# Patient Record
Sex: Male | Born: 1945 | Race: White | Hispanic: No | Marital: Married | Smoking: Former smoker
Health system: Southern US, Community
[De-identification: ages and names within clinical notes are randomized; demographics above are authoritative.]

## PROBLEM LIST (undated history)

## (undated) DIAGNOSIS — I219 Acute myocardial infarction, unspecified: Secondary | ICD-10-CM

## (undated) DIAGNOSIS — I2510 Atherosclerotic heart disease of native coronary artery without angina pectoris: Secondary | ICD-10-CM

## (undated) DIAGNOSIS — I714 Abdominal aortic aneurysm, without rupture, unspecified: Secondary | ICD-10-CM

## (undated) DIAGNOSIS — I10 Essential (primary) hypertension: Secondary | ICD-10-CM

## (undated) DIAGNOSIS — I6529 Occlusion and stenosis of unspecified carotid artery: Secondary | ICD-10-CM

## (undated) DIAGNOSIS — I712 Thoracic aortic aneurysm, without rupture, unspecified: Secondary | ICD-10-CM

## (undated) DIAGNOSIS — M069 Rheumatoid arthritis, unspecified: Secondary | ICD-10-CM

## (undated) HISTORY — PX: FRACTURE SURGERY: SHX138

## (undated) HISTORY — DX: Rheumatoid arthritis, unspecified: M06.9

## (undated) HISTORY — DX: Abdominal aortic aneurysm, without rupture, unspecified: I71.40

## (undated) HISTORY — DX: Thoracic aortic aneurysm, without rupture, unspecified: I71.20

## (undated) HISTORY — PX: OTHER SURGICAL HISTORY: SHX169

## (undated) HISTORY — DX: Acute myocardial infarction, unspecified: I21.9

## (undated) HISTORY — DX: Occlusion and stenosis of unspecified carotid artery: I65.29

## (undated) HISTORY — PX: EYE SURGERY: SHX253

## (undated) HISTORY — PX: CATARACT EXTRACTION: SUR2

---

## 2005-05-26 ENCOUNTER — Ambulatory Visit (HOSPITAL_COMMUNITY): Admission: RE | Admit: 2005-05-26 | Discharge: 2005-05-26 | Payer: Self-pay | Admitting: Neurosurgery

## 2007-09-16 ENCOUNTER — Ambulatory Visit (HOSPITAL_COMMUNITY)
Admission: AD | Admit: 2007-09-16 | Discharge: 2007-09-16 | Payer: Self-pay | Attending: General Surgery | Admitting: General Surgery

## 2010-07-30 NOTE — Op Note (Signed)
NAMEEZZARD, DITMER               ACCOUNT NO.:  [id number]   MEDICAL RECORD NO.:  [id number]          PATIENT TYPE:  AMB   LOCATION:  SDS                          FACILITY:  [facility]   PHYSICIAN:  Johnette Abraham, MD    DATE OF BIRTH:  1945-07-25   DATE OF PROCEDURE:  09/16/2007  DATE OF DISCHARGE:  09/16/2007                               OPERATIVE REPORT   PREOPERATIVE DIAGNOSIS:  Complex laceration to the left long finger and  left ring finger.   POSTOPERATIVE DIAGNOSIS:  Complex laceration to the left long finger and  left ring finger.   PROCEDURES:  1. Exploration of the wound left long finger.  2. Exploration of the wound left ring finger.  3. Irrigation and debridement of skin and subcutaneous tissue of the      left long finger and the left ring finger.  4. Layer repair of the lacerations totaling 7 cm.   SURGEON:  Johnette Abraham, MD.   ASSISTANT:  None.   ANESTHESIA:  General.   FINDINGS:  Neurovascular bundles to both the digits were intact.  Flexor  tendons to both the digits were intact.  There was some skin loss over  the left long finger.   ESTIMATED BLOOD LOSS:  5 mL.   COMPLICATIONS:  No acute complications.   INDICATIONS:  Mr. Hennick is a 65 year old gentleman who had a backhoe  overturn on his hand.  He was referred from the urgent to my office.  He  was evaluated and I felt he needed operative exploration and repair.  Consent was obtained.   PROCEDURE:  The patient was taken to the operating room, placed supine  on the operating room table.  The left hand was prepped and draped in  normal sterile fashion.  The patient received preoperative antibiotics.  The hand was elevated and exsanguinated.  Tourniquet was inflated 250  mmHg.  The left long finger was addressed first.  This was what appeared  to be a more severe and larger wound.  This wound  extended from the  pulp of the finger all the way back to the PIP flexion crease.  The  wound was  irrigated thoroughly with irrigation solution.  Nonviable skin  and subcutaneous tissues were debrided.  The ulnar-sided neurovascular  bundle was readily visualized and followed proximally and distally and  was intact.  The radial neurovascular bundle was still covered by soft  tissue and therefore was intact.  The wound was down to the flexor  tendon sheath.  There was no laceration of the sheath and range of  motion of the finger revealed no flexor tendon laceration.  The  approximation was performed in layers.  The deep layers approximated  with 6-0 Vicryl and several interrupted sutures of 5-0 nylon were used  in the skin to approximate the wound.  There still was approximately a  1cm wide and 1.5 centimeter long defect that was not covered with skin.  However, the vital structures were covered and this was left open to  heal by secondary intention.  Following the left ring finger  was  addressed.  This wound was mostly distal and extended from approximately  the base of the nail bed on the volar side of the finger, back to just  proximal to the DIP flexion crease.  The wound was thoroughly irrigated.  The skin and subcutaneous tissue were debrided.  This wound was more  superficial and did not reach the flexor tendon sheath.  The  neurovascular bundle on the radial side was visualized and intact.  Neurovascular bundle on the ulnar side was still covered.  Following,  the tourniquet was released.  Hemostasis was obtained with direct  pressure.  The wound on left ring finger was approximated in two layers.  Lower layer being a 6-0 Vicryl.  The skin was closed with interrupted of  5-0 nylon sutures.  Xeroform dressing was then placed and a sterile  dressing and volar resting splint.  The patient tolerated the procedure  well.  He was taken to the recovery room in stable condition.      Johnette Abraham, MD  Electronically Signed     HCC/MEDQ  D:  09/16/2007  T:  09/17/2007  Job:   [id number]

## 2010-08-02 NOTE — Op Note (Signed)
Austin Cox, Austin Cox               ACCOUNT NO.:  [id number]   MEDICAL RECORD NO.:  [id number]          PATIENT TYPE:  AMB   LOCATION:  SDS                          FACILITY:  [facility]   PHYSICIAN:  Henry A. Pool, M.D.    DATE OF BIRTH:  [birth year]   DATE OF PROCEDURE:  05/26/2005  DATE OF DISCHARGE:                                 OPERATIVE REPORT   SERVICE:  Neurosurgery.   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   PROCEDURE:  Right L4-L5 laminotomy and microdiskectomy.   SURGEON:  Kathaleen Maser. Pool, M.D.   ASSISTANT:  Donalee Citrin, M.D.   ANESTHESIA:  General endotracheal.   INDICATIONS FOR PROCEDURE:  Austin Cox is a 65 year old male with a history  of back and right lower extremity pain consistent with right sided L5  radiculopathy.  Workup demonstrates evidence of  rightward L4-L5 disc  herniation, compression of the thecal sac and right sided L5 nerve root.  The patient has been counseled as to his options.  He has decided to proceed  with a right sided L4-L5 laminotomy and microdiskectomy in hopes of  improving his symptoms.   DESCRIPTION OF PROCEDURE:  The patient was brought to the operating room and  placed on the table in a supine position.  After an adequate level of  anesthesia was achieved, the patient was positioned prone on the Wilson  frame and properly padded for operation in the lumbar region.  He was  prepped and draped sterilely.  A 10 blade was used to make a linear skin  incision overlying the L4-L5 interspace.  This was carried down sharply in  the midline.  Subperiosteal dissection was performed exposing the lamina and  facet joints at L4 and L5.  Deep self-retaining retractors were placed.  Interoperative x-ray was taken and the level was confirmed.   The laminotomy was then performed using the high speed drill and  Kerrison  rongeurs to remove the inferior aspect of the lamina of L4, medial aspect of  the L4-L5 facet joint, and the superior rim of the L5 lamina.  The  ligamentum flavum was then elevated and retracted in piecemeal fashion using  Kerrison rongeurs.  The underlying thecal sac and exiting L5 nerve root were  identified.  The microscope was brought onto the field and using  microdissection.  The epidural venous plexus was coagulated and cut.  The  thecal sac and L5 nerve root were mobilized and directed toward the midline.  The disc herniation was readily apparent.  The disc space was then incised  with a 15 blade in a rectangular fashion and a wide disc space clean out was  then achieved using pituitary rongeurs, upward angled pituitary rongeurs,  and Epstein curets.  All loose degenerative disc disease was removed from  the interspace.  All elements of the disc herniation were completely  resected.   At this point, a very thorough discectomy had been performed.  There was no  evidence of injury to  the thecal sac or nerve roots.  There was no evidence  of any residual compression upon the thecal sac and nerve roots.  The wound  was then irrigated with antibiotic solution.  Gelfoam was placed topically  for hemostasis which was found to be good.  The microscope and retractors  were removed.  Hemostasis in the muscle was achieved with electrocautery.  The wounds were closed in layers with Vicryl sutures.  Steri-Strips and  sterile dressing were applied.  There were no complications.  The patient  tolerated the procedure well and he was returned to the recovery room in  stable condition.           ______________________________  Kathaleen Maser Pool, M.D.     HAP/MEDQ  D:  05/26/2005  T:  05/27/2005  Job:  [id number]

## 2010-12-12 LAB — DIFFERENTIAL
Basophils Absolute: 0
Basophils Relative: 0
Eosinophils Relative: 3
Lymphocytes Relative: 17
Lymphs Abs: 1.3
Monocytes Absolute: 0.6
Neutro Abs: 5.5
Neutrophils Relative %: 73

## 2010-12-12 LAB — BASIC METABOLIC PANEL
BUN: 13
GFR calc Af Amer: >60
Sodium: 139

## 2010-12-12 LAB — CBC
HCT: 39.1
MCHC: 35.9
RDW: 12.9

## 2013-08-18 DIAGNOSIS — M5136 Other intervertebral disc degeneration, lumbar region: Secondary | ICD-10-CM | POA: Insufficient documentation

## 2013-08-18 DIAGNOSIS — M51369 Other intervertebral disc degeneration, lumbar region without mention of lumbar back pain or lower extremity pain: Secondary | ICD-10-CM | POA: Insufficient documentation

## 2013-08-18 DIAGNOSIS — M519 Unspecified thoracic, thoracolumbar and lumbosacral intervertebral disc disorder: Secondary | ICD-10-CM | POA: Insufficient documentation

## 2013-08-18 DIAGNOSIS — M5410 Radiculopathy, site unspecified: Secondary | ICD-10-CM | POA: Insufficient documentation

## 2013-08-25 DIAGNOSIS — I10 Essential (primary) hypertension: Secondary | ICD-10-CM | POA: Insufficient documentation

## 2013-10-11 DIAGNOSIS — I10 Essential (primary) hypertension: Secondary | ICD-10-CM | POA: Insufficient documentation

## 2014-06-05 ENCOUNTER — Other Ambulatory Visit (HOSPITAL_BASED_OUTPATIENT_CLINIC_OR_DEPARTMENT_OTHER): Payer: Self-pay | Admitting: Neurosurgery

## 2014-06-05 DIAGNOSIS — M5416 Radiculopathy, lumbar region: Secondary | ICD-10-CM

## 2014-06-10 ENCOUNTER — Ambulatory Visit (HOSPITAL_BASED_OUTPATIENT_CLINIC_OR_DEPARTMENT_OTHER): Payer: Medicare Other

## 2014-06-17 ENCOUNTER — Ambulatory Visit (HOSPITAL_BASED_OUTPATIENT_CLINIC_OR_DEPARTMENT_OTHER)
Admission: RE | Admit: 2014-06-17 | Discharge: 2014-06-17 | Disposition: A | Discharge status: Home / Self Care | Payer: Medicare Other | Source: Ambulatory Visit | Attending: Neurosurgery | Admitting: Neurosurgery

## 2014-06-17 DIAGNOSIS — M5116 Intervertebral disc disorders with radiculopathy, lumbar region: Secondary | ICD-10-CM | POA: Insufficient documentation

## 2014-06-17 DIAGNOSIS — M5117 Intervertebral disc disorders with radiculopathy, lumbosacral region: Secondary | ICD-10-CM | POA: Diagnosis not present

## 2014-06-17 DIAGNOSIS — M5416 Radiculopathy, lumbar region: Secondary | ICD-10-CM | POA: Diagnosis present

## 2014-06-17 MED ORDER — GADOBENATE DIMEGLUMINE 529 MG/ML IV SOLN: 20.0000 mL | Freq: Once | INTRAVENOUS | Status: AC | PRN

## 2014-07-05 ENCOUNTER — Other Ambulatory Visit: Payer: Self-pay | Admitting: Neurosurgery

## 2014-07-05 DIAGNOSIS — M545 Low back pain: Secondary | ICD-10-CM

## 2014-07-06 ENCOUNTER — Ambulatory Visit
Admission: RE | Admit: 2014-07-06 | Discharge: 2014-07-06 | Disposition: A | Discharge status: Home / Self Care | Payer: Medicare Other | Source: Ambulatory Visit | Attending: Neurosurgery | Admitting: Neurosurgery

## 2014-07-06 DIAGNOSIS — M545 Low back pain: Secondary | ICD-10-CM

## 2014-07-06 MED ORDER — IOHEXOL 180 MG/ML  SOLN
1.0000 mL | Freq: Once | INTRAMUSCULAR | Status: AC | PRN
  Administered 2014-07-06: 1 mL via EPIDURAL

## 2014-07-06 MED ORDER — METHYLPREDNISOLONE ACETATE 40 MG/ML INJ SUSP (RADIOLOG
120.0000 mg | Freq: Once | INTRAMUSCULAR | Status: AC
  Administered 2014-07-06: 120 mg via EPIDURAL

## 2014-07-06 NOTE — Discharge Instructions (Signed)

## 2014-12-21 DIAGNOSIS — Z955 Presence of coronary angioplasty implant and graft: Secondary | ICD-10-CM | POA: Insufficient documentation

## 2014-12-21 DIAGNOSIS — I2510 Atherosclerotic heart disease of native coronary artery without angina pectoris: Secondary | ICD-10-CM | POA: Insufficient documentation

## 2015-02-27 ENCOUNTER — Other Ambulatory Visit: Payer: Self-pay | Admitting: Neurosurgery

## 2015-02-28 ENCOUNTER — Other Ambulatory Visit: Payer: Self-pay | Admitting: Neurosurgery

## 2015-02-28 DIAGNOSIS — M5416 Radiculopathy, lumbar region: Secondary | ICD-10-CM

## 2015-10-17 DIAGNOSIS — E7800 Pure hypercholesterolemia, unspecified: Secondary | ICD-10-CM | POA: Insufficient documentation

## 2015-12-18 DIAGNOSIS — K219 Gastro-esophageal reflux disease without esophagitis: Secondary | ICD-10-CM | POA: Insufficient documentation

## 2015-12-18 DIAGNOSIS — M545 Low back pain, unspecified: Secondary | ICD-10-CM | POA: Insufficient documentation

## 2015-12-18 DIAGNOSIS — N400 Enlarged prostate without lower urinary tract symptoms: Secondary | ICD-10-CM | POA: Insufficient documentation

## 2015-12-18 DIAGNOSIS — G8929 Other chronic pain: Secondary | ICD-10-CM | POA: Insufficient documentation

## 2016-08-28 DIAGNOSIS — H25811 Combined forms of age-related cataract, right eye: Secondary | ICD-10-CM | POA: Insufficient documentation

## 2017-02-17 DIAGNOSIS — E559 Vitamin D deficiency, unspecified: Secondary | ICD-10-CM | POA: Insufficient documentation

## 2017-06-02 DIAGNOSIS — D696 Thrombocytopenia, unspecified: Secondary | ICD-10-CM | POA: Insufficient documentation

## 2017-09-04 ENCOUNTER — Other Ambulatory Visit: Payer: Self-pay | Admitting: Neurosurgery

## 2017-09-04 DIAGNOSIS — M5416 Radiculopathy, lumbar region: Secondary | ICD-10-CM

## 2017-09-14 ENCOUNTER — Ambulatory Visit
Admission: RE | Admit: 2017-09-14 | Discharge: 2017-09-14 | Disposition: A | Discharge status: Home / Self Care | Payer: Medicare Other | Source: Ambulatory Visit | Attending: Neurosurgery | Admitting: Neurosurgery

## 2017-09-14 DIAGNOSIS — M5416 Radiculopathy, lumbar region: Secondary | ICD-10-CM

## 2017-09-14 MED ORDER — METHYLPREDNISOLONE ACETATE 40 MG/ML INJ SUSP (RADIOLOG
120.0000 mg | Freq: Once | INTRAMUSCULAR | Status: AC
  Administered 2017-09-14: 120 mg via EPIDURAL

## 2017-09-14 MED ORDER — IOPAMIDOL (ISOVUE-M 200) INJECTION 41%
1.0000 mL | Freq: Once | INTRAMUSCULAR | Status: AC
  Administered 2017-09-14: 1 mL via EPIDURAL

## 2017-09-14 NOTE — Discharge Instructions (Signed)

## 2018-10-24 DIAGNOSIS — G8929 Other chronic pain: Secondary | ICD-10-CM | POA: Insufficient documentation

## 2018-10-24 DIAGNOSIS — M47816 Spondylosis without myelopathy or radiculopathy, lumbar region: Secondary | ICD-10-CM | POA: Insufficient documentation

## 2018-10-24 DIAGNOSIS — M0579 Rheumatoid arthritis with rheumatoid factor of multiple sites without organ or systems involvement: Secondary | ICD-10-CM | POA: Insufficient documentation

## 2018-10-24 DIAGNOSIS — M47812 Spondylosis without myelopathy or radiculopathy, cervical region: Secondary | ICD-10-CM | POA: Insufficient documentation

## 2018-10-24 DIAGNOSIS — M5030 Other cervical disc degeneration, unspecified cervical region: Secondary | ICD-10-CM | POA: Insufficient documentation

## 2019-05-13 ENCOUNTER — Ambulatory Visit: Payer: Medicare Other | Attending: Internal Medicine

## 2019-05-13 DIAGNOSIS — Z23 Encounter for immunization: Secondary | ICD-10-CM | POA: Insufficient documentation

## 2019-05-13 NOTE — Progress Notes (Signed)
   Covid-19 Vaccination Clinic  Name:  Austin Cox    MRN: [id number] DOB: Jan 23, 1946  05/13/2019  [name] was observed post Covid-19 immunization for 15 minutes without incidence. He was provided with Vaccine Information Sheet and instruction to access the V-Safe system.   [name] was instructed to call 911 with any severe reactions post vaccine: Marland Kitchen Difficulty breathing  . Swelling of your face and throat  . A fast heartbeat  . A bad rash all over your body  . Dizziness and weakness    Immunizations Administered    Name Date Dose VIS Date Route   Pfizer COVID-19 Vaccine 05/13/2019 12:03 PM 0.3 mL 02/25/2019 Intramuscular   Manufacturer: [employer], [employer]   Lot: [id number]   NDC: [address]-2

## 2019-06-08 ENCOUNTER — Ambulatory Visit: Payer: Medicare Other | Attending: Internal Medicine

## 2019-06-08 DIAGNOSIS — Z23 Encounter for immunization: Secondary | ICD-10-CM

## 2019-06-08 NOTE — Progress Notes (Signed)
   Covid-19 Vaccination Clinic  Name:  Austin Cox    MRN: [id number] DOB: 09-May-1945  06/08/2019  [name] was observed post Covid-19 immunization for 15 minutes without incident. He was provided with Vaccine Information Sheet and instruction to access the V-Safe system.   [name] was instructed to call 911 with any severe reactions post vaccine: Marland Kitchen Difficulty breathing  . Swelling of face and throat  . A fast heartbeat  . A bad rash all over body  . Dizziness and weakness   Immunizations Administered    Name Date Dose VIS Date Route   Pfizer COVID-19 Vaccine 06/08/2019  9:22 AM 0.3 mL 02/25/2019 Intramuscular   Manufacturer: [employer], [employer]   Lot: [id number]   NDC: [address]-2

## 2019-11-14 ENCOUNTER — Ambulatory Visit (INDEPENDENT_AMBULATORY_CARE_PROVIDER_SITE_OTHER): Payer: Medicare Other | Admitting: Ophthalmology

## 2019-11-14 ENCOUNTER — Encounter (INDEPENDENT_AMBULATORY_CARE_PROVIDER_SITE_OTHER): Payer: Self-pay | Admitting: Ophthalmology

## 2019-11-14 ENCOUNTER — Other Ambulatory Visit: Payer: Self-pay

## 2019-11-14 DIAGNOSIS — H35033 Hypertensive retinopathy, bilateral: Secondary | ICD-10-CM

## 2019-11-14 DIAGNOSIS — H3322 Serous retinal detachment, left eye: Secondary | ICD-10-CM

## 2019-11-14 DIAGNOSIS — Z961 Presence of intraocular lens: Secondary | ICD-10-CM

## 2019-11-14 DIAGNOSIS — I10 Essential (primary) hypertension: Secondary | ICD-10-CM

## 2019-11-14 DIAGNOSIS — H3581 Retinal edema: Secondary | ICD-10-CM

## 2019-11-14 NOTE — Progress Notes (Signed)
[place] [facility] Note  11/14/2019     CHIEF COMPLAINT Patient presents for Retina Evaluation   HISTORY OF PRESENT ILLNESS: Austin Cox is a 74 y.o. male who presents to the clinic today for:   HPI    Retina Evaluation    In left eye.  Duration of 4 days.  Associated Symptoms Blind Spot.  I, the attending physician,  performed the HPI with the patient and updated documentation appropriately.          Comments    4 days ago pt notice depth perception was off.  He was seen by Dr. Marin Comment Friday morning.  Dx torn retina OS.  He can only see temporal vision OS, nasal is black.   Dr. Marin Comment gave him a sample of eye drops (unsure if Rx or OTC).       Last edited by Bernarda Caffey, MD on 11/14/2019  1:12 PM. (History)    pt states last Thursday he started to notice a problem with his depth perception, he states he went to see Dr. Anthony Sar in [place] on Friday who told him he has a torn retina, pt has had cataract sx OU, OS several years ago at [facility] ([facility]), OD with Dr. Larose Kells 3 years ago, pt denies seeing any flashing lights or floaters prior to appt with Dr. Marin Comment  Referring physician: Harlen Labs, MD [address] [place],   [id number]  HISTORICAL INFORMATION:   Selected notes from the MEDICAL RECORD NUMBER Referred by Dr. Anthony Sar for concern of RD OS   CURRENT MEDICATIONS: No current outpatient medications on file. (Ophthalmic Drugs)   No current facility-administered medications for this visit. (Ophthalmic Drugs)   Current Outpatient Medications (Other)  Medication Sig  . diazepam (VALIUM) 5 MG tablet Take 1 tablet the night before the procedure, take 1 tablet one hour prior to procedure, and then take 1 tablet as you are walking into procedure  . gabapentin (NEURONTIN) 300 MG capsule Take 1 capsule by mouth 3  times a day  . HYDROcodone-acetaminophen (NORCO/VICODIN) 5-325 MG per tablet Take 1 tablet by mouth.  Marland Kitchen lisinopril  (PRINIVIL,ZESTRIL) 20 MG tablet Take 20 mg by mouth.  . tamsulosin (FLOMAX) 0.4 MG CAPS capsule Take 2 capsules by mouth  daily  . tiZANidine (ZANAFLEX) 4 MG tablet Take 1 tablet by mouth 3  times a day   No current facility-administered medications for this visit. (Other)      REVIEW OF SYSTEMS: ROS    Negative for: Constitutional, Gastrointestinal, Neurological, Skin, Genitourinary, Musculoskeletal, HENT, Endocrine, Cardiovascular, Eyes, Respiratory, Psychiatric, Allergic/Imm, Heme/Lymph   Last edited by Leonie Douglas, COA on 11/14/2019 12:45 PM. (History)       ALLERGIES No Known Allergies  PAST MEDICAL HISTORY Past Medical History:  Diagnosis Date  . Myocardial infarction ([place])   . RA (rheumatoid arthritis) ([place])    Past Surgical History:  Procedure Laterality Date  . CATARACT EXTRACTION    . Stent in heart      FAMILY HISTORY History reviewed. No pertinent family history.  SOCIAL HISTORY Social History   Tobacco Use  . Smoking status: Former Smoker    Packs/day: 0.20    Years: 10.00    Pack years: 2.00    Types: Cigarettes    Quit date: 07/06/1974    Years since quitting: 45.3  . Smokeless tobacco: Never Used  Substance Use Topics  . Alcohol use: Not on file  .  Drug use: Not on file         OPHTHALMIC EXAM:  Base Eye Exam    Visual Acuity (Snellen - Linear)      Right Left   Dist cc 20/20 HM Temporally   Dist ph cc  NI       Tonometry (Tonopen, 12:59 PM)      Right Left   Pressure 11 11       Pupils      Dark Light Shape React APD   Right 3 2 Round Brisk None   Left 3 4 Round Brisk +2       Visual Fields (Counting fingers)      Left Right     Full   Restrictions Total superior temporal, inferior temporal, superior nasal, inferior nasal deficiencies        Extraocular Movement      Right Left    Full Full       Neuro/Psych    Oriented x3: Yes   Mood/Affect: Normal       Dilation    Both eyes: 1.0% Mydriacyl, 2.5%  Phenylephrine @ 12:59 PM        Slit Lamp and Fundus Exam    Slit Lamp Exam      Right Left   Lids/Lashes Dermatochalasis - upper lid Dermatochalasis - upper lid   Conjunctiva/Sclera White and quiet White and quiet   Cornea arcus, 2-3+ Punctate epithelial erosions arcus, 2-3+ Punctate epithelial erosions   Anterior Chamber Deep and quiet Deep and quiet   Iris Round and moderately dilated Round and moderately dilated, TIDs nasally   Lens PC IOL in good position, trace Posterior capsular opacification 3 piece PC IOL with mild superior displacement and tilt; open PC   Vitreous Vitreous syneresis, Posterior vitreous detachment Vitreous syneresis       Fundus Exam      Right Left   Disc Pink and Sharp Pink and Sharp   C/D Ratio 0.3 0.6   Macula Flat, Blunted foveal reflex, mild RPE mottling, No heme or edema Bullous detachment with corrugations; fovea off   Vessels Vascular attenuation, mild tortuousity, mild AV crossing changes Vascular attenuation, mild tortuousity   Periphery Attached, No heme Bullous temporal detachment from 0130 to 0700, pigmented subretinal band temporal periphery; early starfold IT periphery; ?small, hairline tear at 0130; very thin/atrophic retina ST periphery        Refraction    Wearing Rx      Sphere Cylinder Axis Add   Right -0.50 +1.25 170 +2.50   Left [place] +1.00 165 +2.50          IMAGING AND PROCEDURES  Imaging and Procedures for _0 @  OCT, Retina - OU - Both Eyes       Right Eye Quality was good. Central Foveal Thickness: 289. Progression has no prior data. Findings include normal foveal contour, no IRF, no SRF.   Left Eye Quality was poor. Findings include (Hazy single line scan obtained -- macula detached).   Notes *Images captured and stored on drive  Diagnosis / Impression:  OD: NFP, no IRF/SRF OS: Hazy single line scan obtained -- macula detached  Clinical management:  See below  Abbreviations: NFP - Normal foveal  profile. CME - cystoid macular edema. PED - pigment epithelial detachment. IRF - intraretinal fluid. SRF - subretinal fluid. EZ - ellipsoid zone. ERM - epiretinal membrane. ORA - outer retinal atrophy. ORT - outer retinal tubulation. SRHM - subretinal hyper-reflective material. IRHM -  intraretinal hyper-reflective material.                ASSESSMENT/PLAN:    ICD-10-CM   1. 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 Retinal edema  H35.81 OCT, Retina - OU - Both Eyes  3. Essential hypertension  I10   4. Hypertensive retinopathy of both eyes  H35.033   5. 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   - Potential treatment options including delimiting laser, pneumatic retinopexy, scleral buckle, and vitrectomy, cryotherapy and laser, and the use of air, gas, and oil discussed with patient. - The risks of blindness, loss of vision, infection, hemorrhage, cataract progression or lens displacement were discussed with patient. - pt will need surgical repair of RD -- will refer pt to Dr. Manuella Ghazi at [facility] -- case discussed with Dr. Manuella Ghazi - pt has appt with Dr. Manuella Ghazi at the [facility] location tomorrow morning, 8 am  3,4. Hypertensive retinopathy OU - discussed importance of tight BP control - monitor  5. Pseudophakia OU  - s/p CE/IOL OU (OS: [facility], OD: [place], 06.14.18)  - IOL OD in good position, OS with mild inf temp displacement and tilt  Ophthalmic Meds Ordered this visit:  No orders of the defined types were placed in this  encounter.      Return if symptoms worsen or fail to improve.  There are no Patient Instructions on file for this visit.   Explained the diagnoses, plan, and follow up with the patient and they expressed understanding.  Patient expressed understanding of the importance of proper follow up care.   This document serves as a record of services personally performed by Gardiner Sleeper, MD, PhD. It was created on their behalf by San Jetty. Owens Shark, OA an ophthalmic technician. The creation of this record is the provider's dictation and/or activities during the visit.    Electronically signed by: San Jetty. Owens Shark, [place] 08.30.2021 3:57 PM   Gardiner Sleeper, M.D., Ph.D. Diseases & Surgery of the Retina and Vitreous [place]  I have reviewed the above documentation for accuracy and completeness, and I agree with the above. Gardiner Sleeper, M.D., Ph.D. 11/14/19 3:57 PM   Abbreviations: M myopia (nearsighted); A astigmatism; H hyperopia (farsighted); P presbyopia; Mrx spectacle prescription;  CTL contact lenses; OD right eye; OS left eye; OU both eyes  XT exotropia; ET esotropia; PEK punctate epithelial keratitis; PEE punctate epithelial erosions; DES dry eye syndrome; MGD meibomian gland dysfunction; ATs artificial tears; PFAT's preservative free artificial tears; [place] nuclear sclerotic cataract; PSC posterior subcapsular cataract; ERM epi-retinal membrane; PVD posterior vitreous detachment; RD retinal detachment; DM diabetes mellitus; DR diabetic retinopathy; NPDR non-proliferative diabetic retinopathy; PDR proliferative diabetic retinopathy; CSME clinically significant macular edema; DME diabetic macular edema; dbh dot blot hemorrhages; CWS cotton wool spot; POAG primary open angle glaucoma; C/D cup-to-disc ratio; HVF humphrey visual field; GVF goldmann visual field; OCT optical coherence tomography; IOP intraocular pressure; BRVO Branch retinal vein occlusion; CRVO central retinal  vein occlusion; CRAO central retinal artery occlusion; BRAO  branch retinal artery occlusion; RT retinal tear; SB scleral buckle; PPV pars plana vitrectomy; VH Vitreous hemorrhage; PRP panretinal laser photocoagulation; IVK intravitreal kenalog; VMT vitreomacular traction; MH Macular hole;  NVD neovascularization of the disc; NVE neovascularization elsewhere; AREDS age related eye disease study; ARMD age related macular degeneration; POAG primary open angle glaucoma; EBMD epithelial/anterior basement membrane dystrophy; ACIOL anterior chamber intraocular lens; IOL intraocular lens; PCIOL posterior chamber intraocular lens; Phaco/IOL phacoemulsification with intraocular lens placement; [place] photorefractive keratectomy; LASIK laser assisted in situ keratomileusis; HTN hypertension; DM diabetes mellitus; COPD chronic obstructive pulmonary disease

## 2019-11-15 DIAGNOSIS — H3342 Traction detachment of retina, left eye: Secondary | ICD-10-CM

## 2019-11-15 DIAGNOSIS — Z961 Presence of intraocular lens: Secondary | ICD-10-CM | POA: Insufficient documentation

## 2019-11-15 DIAGNOSIS — H35033 Hypertensive retinopathy, bilateral: Secondary | ICD-10-CM | POA: Insufficient documentation

## 2019-11-15 HISTORY — DX: Traction detachment of retina, left eye: H33.42

## 2021-02-27 ENCOUNTER — Ambulatory Visit: Payer: Medicare Other | Admitting: Family Medicine

## 2021-03-19 ENCOUNTER — Ambulatory Visit (INDEPENDENT_AMBULATORY_CARE_PROVIDER_SITE_OTHER): Payer: Medicare Other | Admitting: Family Medicine

## 2021-03-19 ENCOUNTER — Encounter: Payer: Self-pay | Admitting: Family Medicine

## 2021-03-19 VITALS — BP 116/73 | HR 73 | Temp 97.8°F | Ht 72.0 in | Wt 216.0 lb

## 2021-03-19 DIAGNOSIS — I2510 Atherosclerotic heart disease of native coronary artery without angina pectoris: Secondary | ICD-10-CM | POA: Diagnosis not present

## 2021-03-19 DIAGNOSIS — E7800 Pure hypercholesterolemia, unspecified: Secondary | ICD-10-CM

## 2021-03-19 DIAGNOSIS — R3914 Feeling of incomplete bladder emptying: Secondary | ICD-10-CM

## 2021-03-19 DIAGNOSIS — R0989 Other specified symptoms and signs involving the circulatory and respiratory systems: Secondary | ICD-10-CM

## 2021-03-19 DIAGNOSIS — E559 Vitamin D deficiency, unspecified: Secondary | ICD-10-CM

## 2021-03-19 DIAGNOSIS — Z23 Encounter for immunization: Secondary | ICD-10-CM

## 2021-03-19 DIAGNOSIS — G894 Chronic pain syndrome: Secondary | ICD-10-CM | POA: Diagnosis not present

## 2021-03-19 DIAGNOSIS — J349 Unspecified disorder of nose and nasal sinuses: Secondary | ICD-10-CM

## 2021-03-19 DIAGNOSIS — H35033 Hypertensive retinopathy, bilateral: Secondary | ICD-10-CM | POA: Diagnosis not present

## 2021-03-19 DIAGNOSIS — R2681 Unsteadiness on feet: Secondary | ICD-10-CM | POA: Diagnosis not present

## 2021-03-19 DIAGNOSIS — K219 Gastro-esophageal reflux disease without esophagitis: Secondary | ICD-10-CM | POA: Diagnosis not present

## 2021-03-19 DIAGNOSIS — I679 Cerebrovascular disease, unspecified: Secondary | ICD-10-CM

## 2021-03-19 DIAGNOSIS — M0579 Rheumatoid arthritis with rheumatoid factor of multiple sites without organ or systems involvement: Secondary | ICD-10-CM

## 2021-03-19 DIAGNOSIS — H9190 Unspecified hearing loss, unspecified ear: Secondary | ICD-10-CM | POA: Insufficient documentation

## 2021-03-19 DIAGNOSIS — I10 Essential (primary) hypertension: Secondary | ICD-10-CM | POA: Diagnosis not present

## 2021-03-19 DIAGNOSIS — D696 Thrombocytopenia, unspecified: Secondary | ICD-10-CM | POA: Diagnosis not present

## 2021-03-19 DIAGNOSIS — N401 Enlarged prostate with lower urinary tract symptoms: Secondary | ICD-10-CM

## 2021-03-19 DIAGNOSIS — I6522 Occlusion and stenosis of left carotid artery: Secondary | ICD-10-CM

## 2021-03-19 NOTE — Progress Notes (Signed)
Subjective:  Patient ID: Austin Cox, male    DOB: 26-Jul-1945, 76 y.o.   MRN: [id number]  Patient Care Team: Baruch Gouty, FNP as PCP - General (Family Medicine)   Chief Complaint:  New Patient (Initial Visit) (Medication management, rheumatoid arthritis, cardiac)   HPI: Austin Cox is a 76 y.o. male presenting on 03/19/2021 for New Patient (Initial Visit) (Medication management, rheumatoid arthritis, cardiac)   Pt presents today to establish care with new PCP as he would like someone closer to home. He has been followed by [facility] in [place]. 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 He is followed by orthopedics, cardiology, ophthalmology, and rheumatology on a regular basis. He would like to establish with specialities closer to home. States RA is well controlled. No anginal symptoms. No abnormal bleeding or bruising. GERD well controlled without signs of esophagitis. No significant visual changes.  He does report gait instability over the last several months, worse over the last 2 months. States he has trouble getting going when he first stands due to feeling dizzy and off balance. States he is unable to walk down inclines as he leans forward more with ambulating. States he has not been evaluated for this in the past. He denies falls, LOC, focal neurological deficits, or resting tremors.    Relevant past medical, surgical, family, and social history reviewed and updated as indicated.  Allergies and medications reviewed and updated. Data reviewed: Chart in Epic.   Past Medical History:  Diagnosis Date   Myocardial infarction ([place])    RA (rheumatoid arthritis) ([place])     Past Surgical History:  Procedure Laterality Date   CATARACT EXTRACTION     Stent in heart      Social History   Socioeconomic  History   Marital status: Married    Spouse name: Not on file   Number of children: Not on file   Years of education: Not on file   Highest education level: Not on file  Occupational History   Not on file  Tobacco Use   Smoking status: Former    Packs/day: 0.20    Years: 10.00    Pack years: 2.00    Types: Cigarettes    Quit date: 07/06/1974    Years since quitting: 46.7   Smokeless tobacco: Never  Substance and Sexual Activity   Alcohol use: Not on file   Drug use: Not on file   Sexual activity: Not on file  Other Topics Concern   Not on file  Social History Narrative   Not on file   Social Determinants of Health   Financial Resource Strain: Not on file  Food Insecurity: Not on file  Transportation Needs: Not on file  Physical Activity: Not on file  Stress: Not on file  Social Connections: Not on file  Intimate Partner Violence: Not on file    Outpatient Encounter Medications as of 03/19/2021  Medication Sig   aspirin 81 MG EC tablet Take by mouth.   baclofen (LIORESAL) 10 MG tablet Take by mouth.   Cholecalciferol 25 MCG (1000 UT) capsule Take by mouth.   diclofenac Sodium (VOLTAREN) 1 % GEL SMARTSIG:Gram(s) Topical 4 Times Daily PRN   etanercept (ENBREL SURECLICK) 50 MG/ML injection INJECT 50MG SUBCUTANEOUSLY  WEEKLY   folic acid (FOLVITE) 1 MG tablet Take by mouth.  gabapentin (NEURONTIN) 300 MG capsule Take 1 capsule by mouth 2 (two) times daily.   methotrexate (RHEUMATREX) 2.5 MG tablet TAKE 7 TABLETS BY MOUTH  ONCE WEEKLY   metoprolol succinate (TOPROL-XL) 25 MG 24 hr tablet Take by mouth.   nitroGLYCERIN (NITROSTAT) 0.3 MG SL tablet PLACE 1 TABLET UNDER THE TONGUE EVERY 5 MINS AS NEEDED FOR CHEST PAIN   rosuvastatin (CRESTOR) 40 MG tablet Take 1 tablet by mouth daily.   tamsulosin (FLOMAX) 0.4 MG CAPS capsule Take 2 capsules by mouth daily.   [DISCONTINUED] gabapentin (NEURONTIN) 300 MG capsule Take 1 capsule by mouth 3  times a day   [DISCONTINUED]  tiZANidine (ZANAFLEX) 4 MG tablet Take 1 tablet by mouth 3  times a day   [DISCONTINUED] diazepam (VALIUM) 5 MG tablet Take 1 tablet the night before the procedure, take 1 tablet one hour prior to procedure, and then take 1 tablet as you are walking into procedure (Patient not taking: Reported on 03/19/2021)   [DISCONTINUED] HYDROcodone-acetaminophen (NORCO/VICODIN) 5-325 MG per tablet Take 1 tablet by mouth.   [DISCONTINUED] lisinopril (PRINIVIL,ZESTRIL) 20 MG tablet Take 20 mg by mouth.   [DISCONTINUED] tamsulosin (FLOMAX) 0.4 MG CAPS capsule Take 2 capsules by mouth  daily   No facility-administered encounter medications on file as of 03/19/2021.    No Known Allergies  Review of Systems  Constitutional:  Negative for activity change, appetite change, chills, diaphoresis, fatigue, fever and unexpected weight change.  HENT:  Negative for trouble swallowing and voice change.   Respiratory:  Negative for cough, choking, chest tightness and shortness of breath.   Cardiovascular:  Negative for chest pain, palpitations and leg swelling.  Gastrointestinal:  Negative for abdominal distention, abdominal pain, anal bleeding, blood in stool, constipation, diarrhea, nausea, rectal pain and vomiting.  Endocrine: Negative for cold intolerance, heat intolerance, polydipsia, polyphagia and polyuria.  Genitourinary:  Negative for decreased urine volume and difficulty urinating.  Musculoskeletal:  Positive for arthralgias, back pain, gait problem, joint swelling and myalgias. Negative for neck pain and neck stiffness.  Neurological:  Positive for dizziness (intermittent). Negative for tremors, seizures, syncope, facial asymmetry, speech difficulty, weakness, light-headedness, numbness and headaches.  Hematological:  Does not bruise/bleed easily.  Psychiatric/Behavioral:  Negative for confusion.   All other systems reviewed and are negative.      Objective:  BP 116/73    Pulse 73    Temp 97.8 F (36.6 C)     Ht 6' (1.829 m)    Wt 216 lb (98 kg)    SpO2 96%    BMI 29.29 kg/m    Wt Readings from Last 3 Encounters:  03/19/21 216 lb (98 kg)    Physical Exam Vitals and nursing note reviewed.  Constitutional:      General: He is not in acute distress.    Appearance: Normal appearance. He is overweight. He is not ill-appearing, toxic-appearing or diaphoretic.  HENT:     Head: Normocephalic and atraumatic.     Right Ear: Tympanic membrane, ear canal and external ear normal. Decreased hearing noted.     Left Ear: Tympanic membrane, ear canal and external ear normal. Decreased hearing noted.     Nose: Nose normal.     Mouth/Throat:     Mouth: Mucous membranes are moist.  Eyes:     General: Lids are normal.     Extraocular Movements: Extraocular movements intact.     Conjunctiva/sclera: Conjunctivae normal.     Comments: Right pupil 2 mm, left pupil  4 mm (normal per pt)  Cardiovascular:     Rate and Rhythm: Normal rate and regular rhythm.     Heart sounds: Normal heart sounds. No murmur heard.   No friction rub. No gallop.  Pulmonary:     Effort: Pulmonary effort is normal.     Breath sounds: Normal breath sounds.  Abdominal:     General: Bowel sounds are normal.     Palpations: Abdomen is soft.  Musculoskeletal:        General: Normal range of motion.     Cervical back: Normal range of motion and neck supple.     Right lower leg: No edema.     Left lower leg: No edema.  Skin:    General: Skin is warm and dry.     Capillary Refill: Capillary refill takes less than 2 seconds.  Neurological:     General: No focal deficit present.     Mental Status: He is alert and oriented to person, place, and time.     Sensory: No sensory deficit.     Motor: No weakness.     Gait: Gait abnormal (short steps, forward leaning, using cane).     Deep Tendon Reflexes: Reflexes normal.    Results for orders placed or performed during the hospital encounter of 45/80/99  Basic metabolic panel  Result  Value Ref Range   Sodium 139    Potassium 3.7    Chloride 109    CO2 24    Glucose, Bld 96    BUN 13    Creatinine, Ser 0.94    Calcium 9.1    GFR calc non Af Amer >60    GFR calc Af Amer      >60        The eGFR has been calculated using the MDRD equation. This calculation has not been validated in all clinical  CBC  Result Value Ref Range   WBC 7.6    RBC 4.30    Hemoglobin 14.0    HCT 39.1    MCV 90.9    MCHC 35.9    RDW 12.9    Platelets 227   Differential  Result Value Ref Range   Neutrophils Relative % 73    Neutro Abs 5.5    Lymphocytes Relative 17    Lymphs Abs 1.3    Monocytes Relative 7    Monocytes Absolute 0.6    Eosinophils Relative 3    Eosinophils Absolute 0.2    Basophils Relative 0    Basophils Absolute 0.0        Pertinent labs & imaging results that were available during my care of the patient were reviewed by me and considered in my medical decision making.  Assessment & Plan:  Austin Cox was seen today for new patient (initial visit).  Diagnoses and all orders for this visit:  Rheumatoid arthritis involving multiple sites with positive rheumatoid factor ([place]) Has been followed by Dr. Fransisca Kaufmann in [place] but would like to go to [facility] as this is closer and it is getting more difficult to travel to [place].  -     Ambulatory referral to Rheumatology  Vitamin D deficiency On repletion therapy. Will check levels today and adjust if warranted.  -     VITAMIN D 25 Hydroxy (Vit-D Deficiency, Fractures)  Thrombocytopenia (HCC) Will check CBC today.  -     CBC with Differential/Platelet  Pure hypercholesterolemia Will check labs today. Diet and exercise encouraged. Will adjust statin therapy  if warranted.  -     Lipid panel -     Ambulatory referral to Cardiology  Essential (primary) hypertension Well controlled on current regimen, continue. Will get baseline labs today. Referral to cardiology as pt would like someone closer than  [place].  -     CBC with Differential/Platelet -     CMP14+EGFR -     Lipid panel -     Thyroid Panel With TSH -     Microalbumin / creatinine urine ratio -     MR Brain W Wo Contrast; Future -     Ambulatory referral to Cardiology  Coronary artery disease involving native coronary artery of native heart without angina pectoris No anginal symptoms. Followed by cardiology in [place] but would like someone closer as it is difficult to travel to [place]. Referral placed today.  -     Lipid panel -     Ambulatory referral to Cardiology  Chronic pain syndrome Followed by rheumatology and ortho on a regular basis.   Hypertensive retinopathy of both eyes Followed by Dr. Manuella Ghazi on a regular basis.   Bilateral carotid bruits Noted on exam, will obtain carotid US. MRI of brain also ordered as pt has gait instability that has worsened over the last 2-3 months.  -     US Carotid Duplex Bilateral; Future- -     MR Brain W Wo Contrast; Future  Gait instability Ongoing for over 6 months but worsening over last 2-3 months. Will obtain MRI brain. Referral pending results. Will check Vit B12 for potential deficiency.  -     MR Brain W Wo Contrast; Future -     Vitamin B12  Vaccinations updated today.    Continue all other maintenance medications.  Follow up plan: Return in about 6 months (around 09/16/2021), or if symptoms worsen or fail to improve.   Continue healthy lifestyle choices, including diet (rich in fruits, vegetables, and lean proteins, and low in salt and simple carbohydrates) and exercise (at least 30 minutes of moderate physical activity daily).  Educational handout given for RA  The above assessment and management plan was discussed with the patient. The patient verbalized understanding of and has agreed to the management plan. Patient is aware to call the clinic if they develop any new symptoms or if symptoms persist or worsen. Patient is aware when to return to the clinic  for a follow-up visit. Patient educated on when it is appropriate to go to the emergency department.   Monia Pouch, FNP-C [facility] [phone]

## 2021-03-19 NOTE — Addendum Note (Signed)
Addended by: Angela Nevin D on: 03/19/2021 04:55 PM   Modules accepted: Orders

## 2021-03-20 ENCOUNTER — Other Ambulatory Visit: Payer: Self-pay | Admitting: *Deleted

## 2021-03-20 DIAGNOSIS — R899 Unspecified abnormal finding in specimens from other organs, systems and tissues: Secondary | ICD-10-CM

## 2021-03-20 DIAGNOSIS — D696 Thrombocytopenia, unspecified: Secondary | ICD-10-CM

## 2021-03-20 LAB — CBC WITH DIFFERENTIAL/PLATELET
Basophils Absolute: 0 10*3/uL (ref 0.0–0.2)
Basos: 0 %
EOS (ABSOLUTE): 0 10*3/uL (ref 0.0–0.4)
Eos: 1 %
Hematocrit: 38.5 % (ref 37.5–51.0)
Hemoglobin: 12.4 g/dL — ABNORMAL LOW (ref 13.0–17.7)
Immature Grans (Abs): 0.1 10*3/uL (ref 0.0–0.1)
Immature Granulocytes: 2 %
Lymphocytes Absolute: 1.1 10*3/uL (ref 0.7–3.1)
Lymphs: 29 %
MCH: 28.1 pg (ref 26.6–33.0)
MCHC: 32.2 g/dL (ref 31.5–35.7)
MCV: 87 fL (ref 79–97)
Monocytes Absolute: 1.2 10*3/uL — ABNORMAL HIGH (ref 0.1–0.9)
Monocytes: 30 %
Neutrophils Absolute: 1.5 10*3/uL (ref 1.4–7.0)
Neutrophils: 38 %
Platelets: 106 10*3/uL — ABNORMAL LOW (ref 150–450)
RBC: 4.42 x10E6/uL (ref 4.14–5.80)
RDW: 15.4 % (ref 11.6–15.4)
WBC: 3.9 10*3/uL (ref 3.4–10.8)

## 2021-03-20 LAB — THYROID PANEL WITH TSH
Free Thyroxine Index: 1.9 (ref 1.2–4.9)
T3 Uptake Ratio: 28 % (ref 24–39)
T4, Total: 6.7 ug/dL (ref 4.5–12.0)
TSH: 2.25 u[IU]/mL (ref 0.450–4.500)

## 2021-03-20 LAB — CMP14+EGFR
ALT: 9 IU/L (ref 0–44)
AST: 13 IU/L (ref 0–40)
Albumin/Globulin Ratio: 2.2 (ref 1.2–2.2)
Albumin: 4.3 g/dL (ref 3.7–4.7)
Alkaline Phosphatase: 70 IU/L (ref 44–121)
BUN/Creatinine Ratio: 9 — ABNORMAL LOW (ref 10–24)
BUN: 9 mg/dL (ref 8–27)
Bilirubin Total: 0.6 mg/dL (ref 0.0–1.2)
CO2: 25 mmol/L (ref 20–29)
Calcium: 9.3 mg/dL (ref 8.6–10.2)
Chloride: 102 mmol/L (ref 96–106)
Creatinine, Ser: 1.04 mg/dL (ref 0.76–1.27)
Globulin, Total: 2 g/dL (ref 1.5–4.5)
Glucose: 95 mg/dL (ref 70–99)
Potassium: 4.3 mmol/L (ref 3.5–5.2)
Sodium: 141 mmol/L (ref 134–144)
Total Protein: 6.3 g/dL (ref 6.0–8.5)
eGFR: 74 mL/min/{1.73_m2} (ref >59)

## 2021-03-20 LAB — LIPID PANEL
Chol/HDL Ratio: 3.9 ratio (ref 0.0–5.0)
Cholesterol, Total: 78 mg/dL — ABNORMAL LOW (ref 100–199)
HDL: 20 mg/dL — ABNORMAL LOW (ref >39)
LDL Chol Calc (NIH): 33 mg/dL (ref 0–99)
Triglycerides: 145 mg/dL (ref 0–149)
VLDL Cholesterol Cal: 25 mg/dL (ref 5–40)

## 2021-03-20 LAB — VITAMIN D 25 HYDROXY (VIT D DEFICIENCY, FRACTURES): Vit D, 25-Hydroxy: 45.7 ng/mL (ref 30.0–100.0)

## 2021-03-20 LAB — VITAMIN B12: Vitamin B-12: 955 pg/mL (ref 232–1245)

## 2021-03-22 ENCOUNTER — Other Ambulatory Visit: Payer: Medicare Other

## 2021-03-22 DIAGNOSIS — I10 Essential (primary) hypertension: Secondary | ICD-10-CM | POA: Diagnosis not present

## 2021-03-22 DIAGNOSIS — R899 Unspecified abnormal finding in specimens from other organs, systems and tissues: Secondary | ICD-10-CM | POA: Diagnosis not present

## 2021-03-23 ENCOUNTER — Other Ambulatory Visit: Payer: Self-pay | Admitting: Family Medicine

## 2021-03-23 DIAGNOSIS — D696 Thrombocytopenia, unspecified: Secondary | ICD-10-CM

## 2021-03-23 LAB — CBC WITH DIFFERENTIAL/PLATELET
Basophils Absolute: 0 10*3/uL (ref 0.0–0.2)
Basos: 0 %
EOS (ABSOLUTE): 0 10*3/uL (ref 0.0–0.4)
Eos: 1 %
Hematocrit: 39.5 % (ref 37.5–51.0)
Hemoglobin: 12.8 g/dL — ABNORMAL LOW (ref 13.0–17.7)
Immature Grans (Abs): 0.1 10*3/uL (ref 0.0–0.1)
Immature Granulocytes: 2 %
Lymphocytes Absolute: 1.1 10*3/uL (ref 0.7–3.1)
Lymphs: 25 %
MCH: 27.9 pg (ref 26.6–33.0)
MCHC: 32.4 g/dL (ref 31.5–35.7)
MCV: 86 fL (ref 79–97)
Monocytes Absolute: 1.1 10*3/uL — ABNORMAL HIGH (ref 0.1–0.9)
Monocytes: 31 %
Neutrophils Absolute: 1.8 10*3/uL (ref 1.4–7.0)
Neutrophils: 41 %
Platelets: 101 10*3/uL — ABNORMAL LOW (ref 150–450)
RBC: 4.59 x10E6/uL (ref 4.14–5.80)
RDW: 15.7 % — ABNORMAL HIGH (ref 11.6–15.4)
WBC: 4.2 10*3/uL (ref 3.4–10.8)

## 2021-03-23 LAB — MICROALBUMIN / CREATININE URINE RATIO
Creatinine, Urine: 31.7 mg/dL
Microalb/Creat Ratio: 44 mg/g creat — ABNORMAL HIGH (ref 0–29)
Microalbumin, Urine: 13.8 ug/mL

## 2021-03-28 ENCOUNTER — Telehealth: Payer: Self-pay | Admitting: Family Medicine

## 2021-03-28 MED ORDER — TAMSULOSIN HCL 0.4 MG PO CAPS: 0.8000 mg | ORAL_CAPSULE | Freq: Every day | ORAL | 6 refills | Status: DC

## 2021-03-28 MED ORDER — CHOLECALCIFEROL 25 MCG (1000 UT) PO CAPS: 1000.0000 [IU] | ORAL_CAPSULE | Freq: Every day | ORAL | 2 refills | Status: AC

## 2021-03-28 MED ORDER — METOPROLOL SUCCINATE ER 25 MG PO TB24: 25.0000 mg | ORAL_TABLET | Freq: Every day | ORAL | 2 refills | Status: DC

## 2021-03-28 MED ORDER — ROSUVASTATIN CALCIUM 40 MG PO TABS: 40.0000 mg | ORAL_TABLET | Freq: Every day | ORAL | 2 refills | Status: DC

## 2021-03-28 NOTE — Addendum Note (Signed)
Addended by: Sonny Masters on: 03/28/2021 02:02 PM   Modules accepted: Orders

## 2021-03-28 NOTE — Telephone Encounter (Signed)
NA/NVM Rx's sent to pharmacy Per Marcelino Duster - She sent the prescriptions in which she thought they discussed. If he needs further, he needs to let her know

## 2021-03-28 NOTE — Telephone Encounter (Signed)
Pt needs all of his Rx's sent to [facility] ASAP. Says he recently saw Kari Baars and was told by [facility] that they cant fill his Rx's until his new PCP Kari Baars) sends his Rx's because they dont have any of her info on file.  Please call patient with update [phone]

## 2021-03-29 ENCOUNTER — Ambulatory Visit (HOSPITAL_COMMUNITY)
Admission: RE | Admit: 2021-03-29 | Discharge: 2021-03-29 | Disposition: A | Discharge status: Home / Self Care | Payer: Medicare Other | Source: Ambulatory Visit | Attending: Family Medicine | Admitting: Family Medicine

## 2021-03-29 ENCOUNTER — Other Ambulatory Visit: Payer: Self-pay

## 2021-03-29 DIAGNOSIS — R0989 Other specified symptoms and signs involving the circulatory and respiratory systems: Secondary | ICD-10-CM | POA: Insufficient documentation

## 2021-03-29 DIAGNOSIS — R2681 Unsteadiness on feet: Secondary | ICD-10-CM | POA: Diagnosis not present

## 2021-03-29 DIAGNOSIS — I10 Essential (primary) hypertension: Secondary | ICD-10-CM | POA: Diagnosis not present

## 2021-03-29 DIAGNOSIS — R262 Difficulty in walking, not elsewhere classified: Secondary | ICD-10-CM | POA: Diagnosis not present

## 2021-03-29 DIAGNOSIS — I6523 Occlusion and stenosis of bilateral carotid arteries: Secondary | ICD-10-CM | POA: Diagnosis not present

## 2021-03-29 DIAGNOSIS — R531 Weakness: Secondary | ICD-10-CM | POA: Diagnosis not present

## 2021-03-29 DIAGNOSIS — G319 Degenerative disease of nervous system, unspecified: Secondary | ICD-10-CM | POA: Diagnosis not present

## 2021-03-29 DIAGNOSIS — E785 Hyperlipidemia, unspecified: Secondary | ICD-10-CM | POA: Diagnosis not present

## 2021-03-29 MED ORDER — GADOBUTROL 1 MMOL/ML IV SOLN
10.0000 mL | Freq: Once | INTRAVENOUS | Status: AC | PRN
  Administered 2021-03-29: 10 mL via INTRAVENOUS

## 2021-03-29 NOTE — Addendum Note (Signed)
Addended by: Sonny MastersAKES, Asencion Loveday M on: 03/29/2021 08:37 PM   Modules accepted: Orders

## 2021-04-01 NOTE — Addendum Note (Signed)
Addended by: Sonny Masters on: 04/01/2021 10:11 AM   Modules accepted: Orders

## 2021-04-02 ENCOUNTER — Encounter: Payer: Self-pay | Admitting: Cardiology

## 2021-04-02 NOTE — Progress Notes (Signed)
Cardiology Office Note   Date:  04/03/2021   ID:  Austin Cox, DOB 28-Sep-1945, MRN [id number]  PCP:  Baruch Gouty, FNP  Cardiologist:   None Referring:  Baruch Gouty, FNP  Chief Complaint  Patient presents with   Coronary Artery Disease      History of Present Illness: Austin Cox is a 76 y.o. male who presents for evaluation of hypertension, dyslipidemia and family history of CAD.   The patient has a history of stent x 2 and was followed at [facility].   I was able to find records from [place].  His cardiac catheterization in 2016 was electively by that report.  Apparently was for an abnormal EKG.  He had nonobstructive disease with 50% stenosis in the LAD.  There were 2 obtuse marginals with the first being subtotally occluded in the second 60 to 70% stenosis.  The right coronary artery had 70% stenosis.  He is presenting as a new patient and says that he never really had symptoms.  He denies any history of chest pressure, neck or arm discomfort.  He has no history of palpitations, presyncope or syncope.  He does walk.  He is limited by rheumatoid arthritis.  He has occasional dizziness.  He has not had any falls or loss of consciousness however.  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     baclofen (LIORESAL) 10 MG tablet Take by mouth.     Cholecalciferol 25 MCG (1000 UT) capsule Take 1 capsule (1,000 Units total) by mouth daily. 90 capsule 2   diclofenac Sodium (VOLTAREN) 1 % GEL SMARTSIG:Gram(s) Topical 4 Times Daily PRN     etanercept (ENBREL SURECLICK) 50 MG/ML injection INJECT 50MG  SUBCUTANEOUSLY  WEEKLY     folic acid (FOLVITE) 1 MG tablet Take by  mouth.     gabapentin (NEURONTIN) 300 MG capsule Take 1 capsule by mouth 2 (two) times daily.     methotrexate (RHEUMATREX) 2.5 MG tablet TAKE 7 TABLETS BY MOUTH  ONCE WEEKLY     metoprolol succinate (TOPROL-XL) 25 MG 24 hr tablet Take 1 tablet (25 mg total) by mouth daily. 90 tablet 2   nitroGLYCERIN (NITROSTAT) 0.3 MG SL tablet PLACE 1 TABLET UNDER THE TONGUE EVERY 5 MINS AS NEEDED FOR CHEST PAIN     rosuvastatin (CRESTOR) 40 MG tablet Take 1 tablet (40 mg total) by mouth daily. 90 tablet 2   tamsulosin (FLOMAX) 0.4 MG CAPS capsule Take 2 capsules (0.8 mg total) by mouth daily. 30 capsule 6   No current facility-administered medications for this visit.    Allergies:   Patient has no known allergies.    Social History:  The patient  reports that he quit smoking about 46 years ago. His smoking use included cigarettes. He has a 2.00 pack-year smoking history. He has never used smokeless tobacco.   Family History:  The patient's family history is not on file.    ROS:  Please see the history of present illness.   Otherwise, review of systems are positive for decreased hearing and balance problems.Marland Kitchen  All other systems are reviewed and negative.    PHYSICAL EXAM: VS:  BP 116/78 (BP Location: Left Arm, Patient Position: Sitting, Cuff Size: Normal)    Pulse 70    Ht 6' (1.829 m)    Wt 213 lb 3.2 oz (96.7 kg)    SpO2 97%    BMI 28.92 kg/m  , BMI Body mass index is 28.92 kg/m. GENERAL:  Well appearing HEENT:  Pupils equal round and reactive, fundi not visualized, oral mucosa unremarkable NECK:  No jugular venous distention, waveform within normal limits, carotid upstroke brisk and symmetric, no bruits, no thyromegaly LYMPHATICS:  No cervical, inguinal adenopathy LUNGS:  Clear to auscultation bilaterally BACK:  No CVA tenderness CHEST:  Unremarkable HEART:  PMI not displaced or sustained,S1 and S2 intervals within normal limits, no acute ST-T wave changes, probable old inferior infarct.  No  S3, no S4, no clicks, no rubs, no murmurs ABD:  Flat, positive bowel sounds normal in frequency in pitch, no bruits, no rebound, no guarding, no midline pulsatile mass, no hepatomegaly, no splenomegaly EXT:  2 plus pulses throughout, no edema, no cyanosis no clubbing SKIN:  No rashes no nodules NEURO:  Cranial nerves II through XII grossly intact, motor grossly intact throughout PSYCH:  Cognitively intact, oriented to person place and time    EKG:  EKG is ordered today. The ekg ordered today demonstrates sinus rhythm, rate 70, axis   Recent Labs: 03/19/2021: ALT 9; BUN 9; Creatinine, Ser 1.04; Potassium 4.3; Sodium 141; TSH 2.250 03/22/2021: Hemoglobin 12.8; Platelets 101    Lipid Panel    Component Value Date/Time   CHOL 78 (L) 03/19/2021 1149   TRIG 145 03/19/2021 1149   HDL 20 (L) 03/19/2021 1149   CHOLHDL 3.9 03/19/2021 1149   LDLCALC 33 03/19/2021 1149      Wt Readings from Last 3 Encounters:  04/03/21 213 lb 3.2 oz (96.7 kg)  03/19/21 216 lb (98 kg)      Other studies Reviewed: Additional studies/ records that were reviewed today include: None. Review of the above records demonstrates:  Please see elsewhere in the note.     ASSESSMENT AND PLAN:  Carotid stenosis:   There was high grade/subtotal occusion of the left ICA.  The patient does have follow-up upcoming as a new patient with Dr. Carlis Abbott at [facility].    HTN: Patient's blood pressure is well controlled.  No change in therapy.  CAD: He had nonobstructive disease as described elsewhere except for branch vessel OM stenosis.  He has no symptoms.  At this point we will continue with aggressive risk reduction.  I reviewed in detail the anatomy with the patient.   Current medicines are reviewed at length with the patient today.  The patient does not have concerns regarding medicines.  The following changes have been made:  no change  Labs/ tests ordered today include: None  Orders Placed This Encounter  Procedures    EKG 12-Lead     Disposition:   FU with me in 6 months.      Signed, Minus Breeding, MD  04/03/2021 5:05 PM    [place]

## 2021-04-03 ENCOUNTER — Ambulatory Visit (INDEPENDENT_AMBULATORY_CARE_PROVIDER_SITE_OTHER): Payer: Medicare Other | Admitting: Cardiology

## 2021-04-03 ENCOUNTER — Other Ambulatory Visit: Payer: Self-pay

## 2021-04-03 ENCOUNTER — Encounter: Payer: Self-pay | Admitting: Cardiology

## 2021-04-03 VITALS — BP 116/78 | HR 70 | Ht 72.0 in | Wt 213.2 lb

## 2021-04-03 DIAGNOSIS — I10 Essential (primary) hypertension: Secondary | ICD-10-CM

## 2021-04-03 DIAGNOSIS — I2510 Atherosclerotic heart disease of native coronary artery without angina pectoris: Secondary | ICD-10-CM

## 2021-04-03 NOTE — Patient Instructions (Signed)
Medication Instructions:  The current medical regimen is effective;  continue present plan and medications.  *If you need a refill on your cardiac medications before your next appointment, please call your pharmacy*  Follow-Up: At [facility], you and your health needs are our priority.  As part of our continuing mission to provide you with exceptional heart care, we have created designated Provider Care Teams.  These Care Teams include your primary Cardiologist (physician) and Advanced Practice Providers (APPs -  Physician Assistants and Nurse Practitioners) who all work together to provide you with the care you need, when you need it.  We recommend signing up for the patient portal called "MyChart".  Sign up information is provided on this After Visit Summary.  MyChart is used to connect with patients for Virtual Visits (Telemedicine).  Patients are able to view lab/test results, encounter notes, upcoming appointments, etc.  Non-urgent messages can be sent to your provider as well.   To learn more about what you can do with MyChart, go to [email or web address].    Your next appointment:   6 month(s)  The format for your next appointment:   In Person  Provider:   James Hochrein, MD   Thank you for choosing [facility]!!     

## 2021-04-05 ENCOUNTER — Ambulatory Visit (INDEPENDENT_AMBULATORY_CARE_PROVIDER_SITE_OTHER): Payer: Medicare Other

## 2021-04-05 VITALS — Ht 72.0 in | Wt 213.0 lb

## 2021-04-05 DIAGNOSIS — Z5941 Food insecurity: Secondary | ICD-10-CM

## 2021-04-05 DIAGNOSIS — Z0001 Encounter for general adult medical examination with abnormal findings: Secondary | ICD-10-CM | POA: Diagnosis not present

## 2021-04-05 DIAGNOSIS — Z599 Problem related to housing and economic circumstances, unspecified: Secondary | ICD-10-CM | POA: Diagnosis not present

## 2021-04-05 DIAGNOSIS — H9192 Unspecified hearing loss, left ear: Secondary | ICD-10-CM | POA: Diagnosis not present

## 2021-04-05 DIAGNOSIS — Z0000 Encounter for general adult medical examination without abnormal findings: Secondary | ICD-10-CM

## 2021-04-05 NOTE — Patient Instructions (Signed)
Austin Cox , Thank you for taking time to come for your Medicare Wellness Visit. I appreciate your ongoing commitment to your health goals. Please review the following plan we discussed and let me know if I can assist you in the future.   Screening recommendations/referrals: Colonoscopy: no longer required Recommended yearly ophthalmology/optometry visit for glaucoma screening and checkup Recommended yearly dental visit for hygiene and checkup  Vaccinations: Influenza vaccine: Done 12/26/2020 - Repeat annually  Pneumococcal vaccine: Done 07/19/2020 - other done? Need date Tdap vaccine: Done 03/19/2021 - Repeat in 10 years Shingles vaccine: Done 03/19/2021 - get second dose at next visit   Covid-19: Done 05/13/2019, 06/08/2019, 01/04/2020, 07/13/2020,  & 12/26/2020  Advanced directives: Advance directive discussed with you today. Even though you declined this today, please call our office should you change your mind, and we can give you the proper paperwork for you to fill out.   Conditions/risks identified: Aim for 30 minutes of exercise or brisk walking each day, drink 6-8 glasses of water and eat lots of fruits and vegetables.   Next appointment: Follow up in one year for your annual wellness visit.   Preventive Care 14 Years and Older, Male  Preventive care refers to lifestyle choices and visits with your health care provider that can promote health and wellness. What does preventive care include? A yearly physical exam. This is also called an annual well check. Dental exams once or twice a year. Routine eye exams. Ask your health care provider how often you should have your eyes checked. Personal lifestyle choices, including: Daily care of your teeth and gums. Regular physical activity. Eating a healthy diet. Avoiding tobacco and drug use. Limiting alcohol use. Practicing safe sex. Taking low doses of aspirin every day. Taking vitamin and mineral supplements as recommended by your  health care provider. What happens during an annual well check? The services and screenings done by your health care provider during your annual well check will depend on your age, overall health, lifestyle risk factors, and family history of disease. Counseling  Your health care provider may ask you questions about your: Alcohol use. Tobacco use. Drug use. Emotional well-being. Home and relationship well-being. Sexual activity. Eating habits. History of falls. Memory and ability to understand (cognition). Work and work Statistician. Screening  You may have the following tests or measurements: Height, weight, and BMI. Blood pressure. Lipid and cholesterol levels. These may be checked every 5 years, or more frequently if you are over 83 years old. Skin check. Lung cancer screening. You may have this screening every year starting at age 83 if you have a 30-pack-year history of smoking and currently smoke or have quit within the past 15 years. Fecal occult blood test (FOBT) of the stool. You may have this test every year starting at age 69. Flexible sigmoidoscopy or colonoscopy. You may have a sigmoidoscopy every 5 years or a colonoscopy every 10 years starting at age 60. Prostate cancer screening. Recommendations will vary depending on your family history and other risks. Hepatitis C blood test. Hepatitis B blood test. Sexually transmitted disease (STD) testing. Diabetes screening. This is done by checking your blood sugar (glucose) after you have not eaten for a while (fasting). You may have this done every 1-3 years. Abdominal aortic aneurysm (AAA) screening. You may need this if you are a current or former smoker. Osteoporosis. You may be screened starting at age 65 if you are at high risk. Talk with your health care provider about your  test results, treatment options, and if necessary, the need for more tests. Vaccines  Your health care provider may recommend certain vaccines, such  as: Influenza vaccine. This is recommended every year. Tetanus, diphtheria, and acellular pertussis (Tdap, Td) vaccine. You may need a Td booster every 10 years. Zoster vaccine. You may need this after age 1. Pneumococcal 13-valent conjugate (PCV13) vaccine. One dose is recommended after age 70. Pneumococcal polysaccharide (PPSV23) vaccine. One dose is recommended after age 42. Talk to your health care provider about which screenings and vaccines you need and how often you need them. This information is not intended to replace advice given to you by your health care provider. Make sure you discuss any questions you have with your health care provider. Document Released: 03/30/2015 Document Revised: 11/21/2015 Document Reviewed: 01/02/2015 Elsevier Interactive Patient Education  2017 [place] Prevention in the Home Falls can cause injuries. They can happen to people of all ages. There are many things you can do to make your home safe and to help prevent falls. What can I do on the outside of my home? Regularly fix the edges of walkways and driveways and fix any cracks. Remove anything that might make you trip as you walk through a door, such as a raised step or threshold. Trim any bushes or trees on the path to your home. Use bright outdoor lighting. Clear any walking paths of anything that might make someone trip, such as rocks or tools. Regularly check to see if handrails are loose or broken. Make sure that both sides of any steps have handrails. Any raised decks and porches should have guardrails on the edges. Have any leaves, snow, or ice cleared regularly. Use sand or salt on walking paths during winter. Clean up any spills in your garage right away. This includes oil or grease spills. What can I do in the bathroom? Use night lights. Install grab bars by the toilet and in the tub and shower. Do not use towel bars as grab bars. Use non-skid mats or decals in the tub or  shower. If you need to sit down in the shower, use a plastic, non-slip stool. Keep the floor dry. Clean up any water that spills on the floor as soon as it happens. Remove soap buildup in the tub or shower regularly. Attach bath mats securely with double-sided non-slip rug tape. Do not have throw rugs and other things on the floor that can make you trip. What can I do in the bedroom? Use night lights. Make sure that you have a light by your bed that is easy to reach. Do not use any sheets or blankets that are too big for your bed. They should not hang down onto the floor. Have a firm chair that has side arms. You can use this for support while you get dressed. Do not have throw rugs and other things on the floor that can make you trip. What can I do in the kitchen? Clean up any spills right away. Avoid walking on wet floors. Keep items that you use a lot in easy-to-reach places. If you need to reach something above you, use a strong step stool that has a grab bar. Keep electrical cords out of the way. Do not use floor polish or wax that makes floors slippery. If you must use wax, use non-skid floor wax. Do not have throw rugs and other things on the floor that can make you trip. What can I do with my  stairs? Do not leave any items on the stairs. Make sure that there are handrails on both sides of the stairs and use them. Fix handrails that are broken or loose. Make sure that handrails are as long as the stairways. Check any carpeting to make sure that it is firmly attached to the stairs. Fix any carpet that is loose or worn. Avoid having throw rugs at the top or bottom of the stairs. If you do have throw rugs, attach them to the floor with carpet tape. Make sure that you have a light switch at the top of the stairs and the bottom of the stairs. If you do not have them, ask someone to add them for you. What else can I do to help prevent falls? Wear shoes that: Do not have high heels. Have  rubber bottoms. Are comfortable and fit you well. Are closed at the toe. Do not wear sandals. If you use a stepladder: Make sure that it is fully opened. Do not climb a closed stepladder. Make sure that both sides of the stepladder are locked into place. Ask someone to hold it for you, if possible. Clearly mark and make sure that you can see: Any grab bars or handrails. First and last steps. Where the edge of each step is. Use tools that help you move around (mobility aids) if they are needed. These include: Canes. Walkers. Scooters. Crutches. Turn on the lights when you go into a dark area. Replace any light bulbs as soon as they burn out. Set up your furniture so you have a clear path. Avoid moving your furniture around. If any of your floors are uneven, fix them. If there are any pets around you, be aware of where they are. Review your medicines with your doctor. Some medicines can make you feel dizzy. This can increase your chance of falling. Ask your doctor what other things that you can do to help prevent falls. This information is not intended to replace advice given to you by your health care provider. Make sure you discuss any questions you have with your health care provider. Document Released: 12/28/2008 Document Revised: 08/09/2015 Document Reviewed: 04/07/2014 Elsevier Interactive Patient Education  2017 [employer].

## 2021-04-05 NOTE — Progress Notes (Signed)
Subjective:   Austin Cox is a 76 y.o. male who presents for an Initial Medicare Annual Wellness Visit.  Virtual Visit via Telephone Note  I connected with  Austin Cox on 04/05/21 at  3:30 PM EST by telephone and verified that I am speaking with the correct person using two identifiers.  Location: Patient: Home Provider: [facility] Persons participating in the virtual visit: patient/wife, Janet/Nurse Health Advisor   I discussed the limitations, risks, security and privacy concerns of performing an evaluation and management service by telephone and the availability of in person appointments. The patient expressed understanding and agreed to proceed.  Interactive audio and video telecommunications were attempted between this nurse and patient, however failed, due to patient having technical difficulties OR patient did not have access to video capability.  We continued and completed visit with audio only.  Some vital signs may be absent or patient reported.   Marisah Laker E Mang Hazelrigg, LPN   Review of Systems     Cardiac Risk Factors include: advanced age (>51men, >32 women);male gender;sedentary lifestyle;dyslipidemia;hypertension;Other (see comment), Risk factor comments: CAD     Objective:    Today's Vitals   04/05/21 1543  Weight: 213 lb (96.6 kg)  Height: 6' (1.829 m)   Body mass index is 28.89 kg/m.  Advanced Directives 04/05/2021  Does Patient Have a Medical Advance Directive? Yes  Type of Estate agent of [place];Living will  Copy of Healthcare Power of Attorney in Chart? No - copy requested    Current Medications (verified) Outpatient Encounter Medications as of 04/05/2021  Medication Sig   aspirin 81 MG EC tablet Take by mouth.   baclofen (LIORESAL) 10 MG tablet Take by mouth.   Cholecalciferol 25 MCG (1000 UT) capsule Take 1 capsule (1,000 Units total) by mouth daily.   diclofenac Sodium (VOLTAREN) 1 % GEL SMARTSIG:Gram(s) Topical 4 Times Daily  PRN   etanercept (ENBREL SURECLICK) 50 MG/ML injection INJECT  SUBCUTANEOUSLY  WEEKLY   folic acid (FOLVITE) 1 MG tablet Take by mouth.   gabapentin (NEURONTIN) 300 MG capsule Take 1 capsule by mouth 2 (two) times daily.   methotrexate (RHEUMATREX) 2.5 MG tablet TAKE 7 TABLETS BY MOUTH  ONCE WEEKLY   metoprolol succinate (TOPROL-XL) 25 MG 24 hr tablet Take 1 tablet (25 mg total) by mouth daily.   nitroGLYCERIN (NITROSTAT) 0.3 MG SL tablet PLACE 1 TABLET UNDER THE TONGUE EVERY 5 MINS AS NEEDED FOR CHEST PAIN   rosuvastatin (CRESTOR) 40 MG tablet Take 1 tablet (40 mg total) by mouth daily.   tamsulosin (FLOMAX) 0.4 MG CAPS capsule Take 2 capsules (0.8 mg total) by mouth daily.   No facility-administered encounter medications on file as of 04/05/2021.    Allergies (verified) Patient has no known allergies.   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 No pertinent family history. Social History   Socioeconomic History   Marital status: Married    Spouse name: Not on file   Number of children: 3   Years of education: Not on file   Highest education level: Not on file  Occupational History   Occupation: retired  Tobacco Use   Smoking status: Former    Packs/day: 0.20  Years: 10.00    Pack years: 2.00    Types: Cigarettes    Quit date: 07/06/1974    Years since quitting: 46.7   Smokeless tobacco: Never  Substance and Sexual Activity   Alcohol use: Not on file   Drug use: Not on file   Sexual activity: Not on file  Other Topics Concern   Not on file  Social History Narrative   3 children - one passed away   Son in [place]   They raised 2 teenage grandchildren after their daughter and son in law both passed away    Social Determinants  of Health   Financial Resource Strain: Medium Risk   Difficulty of Paying Living Expenses: Somewhat hard  Food Insecurity: Geophysicist/field seismologistood Insecurity Present   Worried About Programme researcher, broadcasting/film/videounning Out of Food in the Last Year: Often true   Baristaan Out of Food in the Last Year: Sometimes true  Transportation Needs: No Transportation Needs   Lack of Transportation (Medical): No   Lack of Transportation (Non-Medical): No  Physical Activity: Insufficiently Active   Days of Exercise per Week: 6 days   Minutes of Exercise per Session: 20 min  Stress: No Stress Concern Present   Feeling of Stress : Not at all  Social Connections: Moderately Isolated   Frequency of Communication with Friends and Family: More than three times a week   Frequency of Social Gatherings with Friends and Family: Once a week   Attends Religious Services: Never   Database administratorActive Member of Clubs or Organizations: No   Attends Engineer, structuralClub or Organization Meetings: Never   Marital Status: Married    Tobacco Counseling Counseling given: Not Answered   Clinical Intake:  Pre-visit preparation completed: Yes  Pain : No/denies pain     BMI - recorded: 28.89 Nutritional Status: BMI 25 -29 Overweight Nutritional Risks: None Diabetes: No  How often do you need to have someone help you when you read instructions, pamphlets, or other written materials from your doctor or pharmacy?: 1 - Never  Diabetic? no  Interpreter Needed?: No  Information entered by :: Samuel Mcpeek, LPN   Activities of Daily Living In your present state of health, do you have any difficulty performing the following activities: 04/05/2021  Hearing? 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 N  Difficulty concentrating or making decisions? N  Walking or climbing stairs? Y  Dressing or bathing? N  Doing errands, shopping? N  Preparing Food and eating ? N  Using the Toilet? N  In the past six months, have you accidently leaked urine? N  Do you  have problems with loss of bowel control? N  Managing your Medications? N  Managing your Finances? N  Housekeeping or managing your Housekeeping? N  Some recent data might be hidden    Patient Care Team: Rakes, Doralee AlbinoLinda M, FNP as PCP - General (Family Medicine)  Indicate any recent Medical Services you may have received from other than [facility] providers in the past year (date may be approximate).     Assessment:   This is a routine wellness examination for [place].  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 - referral sent for assistance with hearing aids Vision Screening - Comments:: Wears rx glasses prn - [facility] [place] [facility]  Dietary issues and exercise activities discussed: Current Exercise Habits: Home exercise routine, Type of exercise: walking, Time (Minutes): 15,  Frequency (Times/Week): 6, Weekly Exercise (Minutes/Week): 90, Intensity: Mild, Exercise limited by: orthopedic condition(s);neurologic condition(s)   Goals Addressed             This Visit's Progress    Prevent falls       Walk daily       Depression Screen PHQ 2/9 Scores 04/05/2021 03/19/2021  PHQ - 2 Score 0 0  PHQ- 9 Score 0 0    Fall Risk Fall Risk  04/05/2021 03/19/2021  Falls in the past year? 0 0  Number falls in past yr: 0 -  Injury with Fall? 0 -  Risk for fall due to : Impaired balance/gait;Orthopedic patient -  Follow up Falls prevention discussed -    FALL RISK PREVENTION PERTAINING TO THE HOME:  Any stairs in or around the home? No  If so, are there any without handrails? No  Home free of loose throw rugs in walkways, pet beds, electrical cords, etc? Yes  Adequate lighting in your home to reduce risk of falls? Yes   ASSISTIVE DEVICES UTILIZED TO PREVENT FALLS:  Life alert? No  Use of a cane, walker or w/c? Yes  Grab bars in the bathroom? Yes  Shower chair or bench in shower? Yes  Elevated toilet seat or a  handicapped toilet? Yes   TIMED UP AND GO:  Was the test performed? No . Telephonic visit  Cognitive Function:     6CIT Screen 04/05/2021  What Year? 0 points  What month? 0 points  What time? 0 points  Count back from 20 0 points  Months in reverse 0 points  Repeat phrase 2 points  Total Score 2    Immunizations Immunization History  Administered Date(s) Administered   Fluad Quad(high Dose 65+) 11/28/2016, 12/26/2020   Influenza, High Dose Seasonal PF 11/22/2013, 12/18/2017   Influenza,inj,quad, With Preservative 12/20/2018   PFIZER(Purple Top)SARS-COV-2 Vaccination 05/13/2019, 06/08/2019, 01/04/2020, 07/13/2020   Pfizer Covid-19 Vaccine Bivalent Booster 39yrs & up 12/26/2020   Pneumococcal Conjugate-13 07/19/2020   Td 03/19/2021   Tdap 09/16/2010   Zoster Recombinat (Shingrix) 03/19/2021    TDAP status: Up to date  Flu Vaccine status: Up to date  Pneumococcal vaccine status: Up to date  Covid-19 vaccine status: Completed vaccines  Qualifies for Shingles Vaccine? Yes   Zostavax completed Yes   Shingrix Completed?: No.    Education has been provided regarding the importance of this vaccine. Patient has been advised to call insurance company to determine out of pocket expense if they have not yet received this vaccine. Advised may also receive vaccine at local pharmacy or Health Dept. Verbalized acceptance and understanding.  Screening Tests Health Maintenance  Topic Date Due   Hepatitis C Screening  Never done   Zoster Vaccines- Shingrix (2 of 2) 05/14/2021   Pneumonia Vaccine 36+ Years old (2 - PPSV23 if available, else PCV20) 07/19/2021   TETANUS/TDAP  03/20/2031   INFLUENZA VACCINE  Completed   COVID-19 Vaccine  Completed   HPV VACCINES  Aged Out    Health Maintenance  Health Maintenance Due  Topic Date Due   Hepatitis C Screening  Never done    Colorectal cancer screening: No longer required.   Lung Cancer Screening: (Low Dose CT Chest recommended  if Age 78-80 years, 30 pack-year currently smoking OR have quit w/in 15years.) does not qualify.   Additional Screening:  Hepatitis C Screening: does qualify; Due  Vision Screening: Recommended annual ophthalmology exams for early detection of glaucoma and other disorders  of the eye. Is the patient up to date with their annual eye exam?  Yes  Who is the provider or what is the name of the office in which the patient attends annual eye exams? [facility] [place] If pt is not established with a provider, would they like to be referred to a provider to establish care? No .   Dental Screening: Recommended annual dental exams for proper oral hygiene  Community Resource Referral / Chronic Care Management: CRR required this visit?  No   CCM required this visit?  No      Plan:     I have personally reviewed and noted the following in the patients chart:   Medical and social history Use of alcohol, tobacco or illicit drugs  Current medications and supplements including opioid prescriptions. Patient is not currently taking opioid prescriptions. Functional ability and status Nutritional status Physical activity Advanced directives List of other physicians Hospitalizations, surgeries, and ER visits in previous 12 months Vitals Screenings to include cognitive, depression, and falls Referrals and appointments  In addition, I have reviewed and discussed with patient certain preventive protocols, quality metrics, and best practice recommendations. A written personalized care plan for preventive services as well as general preventive health recommendations were provided to patient.     [place] Constable, LPN   10/09/3662   Nurse Notes: CRR to assist with cost of food, utilities and hearing aids

## 2021-04-09 ENCOUNTER — Other Ambulatory Visit: Payer: Self-pay

## 2021-04-09 ENCOUNTER — Ambulatory Visit (INDEPENDENT_AMBULATORY_CARE_PROVIDER_SITE_OTHER): Payer: Medicare Other | Admitting: Vascular Surgery

## 2021-04-09 ENCOUNTER — Encounter: Payer: Self-pay | Admitting: Vascular Surgery

## 2021-04-09 DIAGNOSIS — I6522 Occlusion and stenosis of left carotid artery: Secondary | ICD-10-CM

## 2021-04-09 DIAGNOSIS — I6529 Occlusion and stenosis of unspecified carotid artery: Secondary | ICD-10-CM | POA: Insufficient documentation

## 2021-04-09 NOTE — Progress Notes (Signed)
Patient name: Austin Cox MRN: [id number] DOB: 10-May-1945 Sex: male  REASON FOR CONSULT: Evaluate carotid artery stenosis  HPI: Austin Cox is a 76 y.o. male, with history of coronary artery disease status post RCA stenting in 2017 at [place], rheumatoid arthritis, carotid stenosis that presents for evaluation of carotid artery disease.  Patient is here with his wife who states that his PCP heard a bruit that prompted further work-up with ultrasound.  He denies any history of TIAs or strokes.  He is neurologically intact today.  He had a carotid ultrasound on 03/29/2021 that showed large amount of left-sided atherosclerotic plaque with sonographic string sign.  He also had a recent MRI that showed no acute evidence of intracranial abnormality.  He does endorse dizziness and gait instability.  Denies CP or SOB at this time.  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 No pertinent family history.  SOCIAL HISTORY: Social History   Socioeconomic History   Marital status: Married    Spouse name: Not on file   Number of children: 3   Years of education: Not on file   Highest education level: Not on file  Occupational History   Occupation: retired  Tobacco Use   Smoking status: Former    Packs/day: 0.20    Years: 10.00    Pack years: 2.00    Types: Cigarettes    Quit date: 07/06/1974    Years since quitting: 46.7   Smokeless tobacco: Never  Substance and Sexual Activity   Alcohol use: Yes   Drug use: Never   Sexual activity: Not on file  Other Topics Concern   Not on file  Social History Narrative   3 children - one passed away   Son in [place]   They raised 2 teenage grandchildren after their daughter and son in law both passed away     Social Determinants of Health   Financial Resource Strain: Medium Risk   Difficulty of Paying Living Expenses: Somewhat hard  Food Insecurity: Landscape architect Present   Worried About Charity fundraiser in the Last Year: Often true   Arboriculturist in the Last Year: Sometimes true  Transportation Needs: No Transportation Needs   Lack of Transportation (Medical): No   Lack of Transportation (Non-Medical): No  Physical Activity: Insufficiently Active   Days of Exercise per Week: 6 days   Minutes of Exercise per Session: 20 min  Stress: No Stress Concern Present   Feeling of Stress : Not at all  Social Connections: Moderately Isolated   Frequency of Communication with Friends and Family: More than three times a week   Frequency of Social Gatherings with Friends and Family: Once a week   Attends Religious Services: Never   Marine scientist or Organizations: No   Attends Music therapist: Never   Marital Status: Married  Human resources officer Violence: Not At Risk   Fear of Current or Ex-Partner: No   Emotionally Abused: No   Physically Abused: No   Sexually Abused: No    No Known Allergies  Current Outpatient Medications  Medication Sig Dispense Refill   aspirin 81 MG EC tablet Take by mouth.  baclofen (LIORESAL) 10 MG tablet Take by mouth.     Cholecalciferol 25 MCG (1000 UT) capsule Take 1 capsule (1,000 Units total) by mouth daily. 90 capsule 2   diclofenac Sodium (VOLTAREN) 1 % GEL SMARTSIG:Gram(s) Topical 4 Times Daily PRN     etanercept (ENBREL SURECLICK) 50 MG/ML injection INJECT 50MG  SUBCUTANEOUSLY  WEEKLY     folic acid (FOLVITE) 1 MG tablet Take by mouth.     gabapentin (NEURONTIN) 300 MG capsule Take 1 capsule by mouth 2 (two) times daily.     methotrexate (RHEUMATREX) 2.5 MG tablet TAKE 7 TABLETS BY MOUTH  ONCE WEEKLY     metoprolol succinate (TOPROL-XL) 25 MG 24 hr tablet Take 1 tablet (25 mg total) by mouth daily. 90 tablet 2   nitroGLYCERIN  (NITROSTAT) 0.3 MG SL tablet PLACE 1 TABLET UNDER THE TONGUE EVERY 5 MINS AS NEEDED FOR CHEST PAIN     rosuvastatin (CRESTOR) 40 MG tablet Take 1 tablet (40 mg total) by mouth daily. 90 tablet 2   tamsulosin (FLOMAX) 0.4 MG CAPS capsule Take 2 capsules (0.8 mg total) by mouth daily. 30 capsule 6   No current facility-administered medications for this visit.    REVIEW OF SYSTEMS:  [X]  denotes positive finding, [ ]  denotes negative finding Cardiac  Comments:  Chest pain or chest pressure:    Shortness of breath upon exertion:    Short of breath when lying flat:    Irregular heart rhythm:        Vascular    Pain in calf, thigh, or hip brought on by ambulation:    Pain in feet at night that wakes you up from your sleep:     Blood clot in your veins:    Leg swelling:         Pulmonary    Oxygen at home:    Productive cough:     Wheezing:         Neurologic    Sudden weakness in arms or legs:     Sudden numbness in arms or legs:     Sudden onset of difficulty speaking or slurred speech:    Temporary loss of vision in one eye:     Problems with dizziness:         Gastrointestinal    Blood in stool:     Vomited blood:         Genitourinary    Burning when urinating:     Blood in urine:        Psychiatric    Major depression:         Hematologic    Bleeding problems:    Problems with blood clotting too easily:        Skin    Rashes or ulcers:        Constitutional    Fever or chills:      PHYSICAL EXAM: Vitals:   04/09/21 1027 04/09/21 1029  BP: 121/75 122/75  Pulse: 80 84  Resp: 18   Temp: 98 F (36.7 C)   TempSrc: Temporal   SpO2: 95%   Weight: 214 lb (97.1 kg)   Height: 6' (1.829 m)     GENERAL: The patient is a well-nourished male, in no acute distress. The vital signs are documented above. CARDIAC: There is a regular rate and rhythm.  VASCULAR:  No previous neck incisions PULMONARY: No respiratory distress. ABDOMEN: Soft and  non-tender. MUSCULOSKELETAL: There are no major deformities or cyanosis. NEUROLOGIC: No focal  weakness or paresthesias are detected.  CN II-XII grossly intact. SKIN: There are no ulcers or rashes noted. PSYCHIATRIC: The patient has a normal affect.  DATA:   Carotid duplex 03/29/2021 shows large amount of eccentric plaque in the origin and proximal left ICA with string sign and markedly elevated peak systolic velocity of [id number].  Assessment/Plan:  76 year old male with high-grade asymptomatic left internal carotid artery stenosis with sonographic string sign identified on ultrasound.  Discussed that for asymptomatic disease current recommendations are for carotid intervention for greater than 80% stenosis if asymptomatic.  Discussed this is for stroke risk reduction.  I have recommended a left carotid endarterectomy for stroke risk reduction.  I discussed this being done under general anesthesia at [facility] and I discussed the basic steps of the operation.  I discussed risk and benefits including risk of anesthesia, risk of cardiac event, 1% risk of perioperative stroke as well as bleeding infection.  Patient and family wish to proceed.  We will get scheduled for Monday.   Marty Heck, MD [facility] Office: [phone]

## 2021-04-11 ENCOUNTER — Other Ambulatory Visit: Payer: Self-pay | Admitting: Vascular Surgery

## 2021-04-12 ENCOUNTER — Other Ambulatory Visit: Payer: Self-pay

## 2021-04-12 ENCOUNTER — Encounter (HOSPITAL_COMMUNITY): Payer: Self-pay | Admitting: Vascular Surgery

## 2021-04-12 LAB — SARS CORONAVIRUS 2 (TAT 6-24 HRS): SARS Coronavirus 2: NEGATIVE

## 2021-04-12 NOTE — Progress Notes (Signed)
Pt has hx of CAD with 2 stents. Pt has recently established care with Dr. Antoine PocheHochrein. Seen 04/03/21. Dr. Antoine PocheHochrein was made aware that pt was going to be seeing Dr. Chestine Sporelark for carotid stenosis. Wife states pt has not had any recent chest pain or shortness of breath. Pt is not diabetic.   Pt had a Covid test done yesterday, it is negative.

## 2021-04-14 NOTE — Anesthesia Preprocedure Evaluation (Addendum)
Anesthesia Evaluation  Patient identified by MRN, date of birth, ID band Patient awake    Reviewed: Allergy & Precautions, NPO status , Patient's Chart, lab work & pertinent test results, reviewed documented beta blocker date and time   Airway Mallampati: III  TM Distance: >3 FB Neck ROM: Full    Dental  (+) Dental Advisory Given, Partial Lower   Pulmonary former smoker,    Pulmonary exam normal breath sounds clear to auscultation       Cardiovascular hypertension, Pt. on home beta blockers + CAD, + Past MI, + Cardiac Stents and + Peripheral Vascular Disease  Normal cardiovascular exam Rhythm:Regular Rate:Normal     Neuro/Psych negative neurological ROS     GI/Hepatic Neg liver ROS, GERD  ,  Endo/Other  negative endocrine ROS  Renal/GU negative Renal ROS     Musculoskeletal  (+) Arthritis , Rheumatoid disorders,    Abdominal   Peds  Hematology negative hematology ROS (+)   Anesthesia Other Findings   Reproductive/Obstetrics                            Anesthesia Physical Anesthesia Plan  ASA: 3  Anesthesia Plan: General   Post-op Pain Management: Tylenol PO (pre-op)   Induction: Intravenous  PONV Risk Score and Plan: 2 and Dexamethasone and Ondansetron  Airway Management Planned: Oral ETT  Additional Equipment: Arterial line  Intra-op Plan:   Post-operative Plan: Extubation in OR  Informed Consent: I have reviewed the patients History and Physical, chart, labs and discussed the procedure including the risks, benefits and alternatives for the proposed anesthesia with the patient or authorized representative who has indicated his/her understanding and acceptance.     Dental advisory given  Plan Discussed with: CRNA  Anesthesia Plan Comments:        Anesthesia Quick Evaluation

## 2021-04-15 ENCOUNTER — Other Ambulatory Visit: Payer: Self-pay

## 2021-04-15 ENCOUNTER — Encounter (HOSPITAL_COMMUNITY): Payer: Self-pay | Admitting: Vascular Surgery

## 2021-04-15 ENCOUNTER — Inpatient Hospital Stay (HOSPITAL_COMMUNITY): Payer: Medicare Other | Admitting: Certified Registered"

## 2021-04-15 ENCOUNTER — Inpatient Hospital Stay (HOSPITAL_COMMUNITY)
Admission: RE | Admit: 2021-04-15 | Discharge: 2021-04-16 | DRG: 038 | Disposition: A | Discharge status: Home / Self Care | Payer: Medicare Other | Attending: Vascular Surgery | Admitting: Vascular Surgery

## 2021-04-15 ENCOUNTER — Encounter (HOSPITAL_COMMUNITY)
Admission: RE | Disposition: A | Discharge status: Home / Self Care | Payer: Self-pay | Source: Home / Self Care | Attending: Vascular Surgery

## 2021-04-15 DIAGNOSIS — Z79899 Other long term (current) drug therapy: Secondary | ICD-10-CM | POA: Diagnosis not present

## 2021-04-15 DIAGNOSIS — E7800 Pure hypercholesterolemia, unspecified: Secondary | ICD-10-CM | POA: Diagnosis not present

## 2021-04-15 DIAGNOSIS — Y838 Other surgical procedures as the cause of abnormal reaction of the patient, or of later complication, without mention of misadventure at the time of the procedure: Secondary | ICD-10-CM | POA: Diagnosis not present

## 2021-04-15 DIAGNOSIS — N400 Enlarged prostate without lower urinary tract symptoms: Secondary | ICD-10-CM | POA: Diagnosis present

## 2021-04-15 DIAGNOSIS — Z7982 Long term (current) use of aspirin: Secondary | ICD-10-CM

## 2021-04-15 DIAGNOSIS — I10 Essential (primary) hypertension: Secondary | ICD-10-CM | POA: Diagnosis present

## 2021-04-15 DIAGNOSIS — Z87891 Personal history of nicotine dependence: Secondary | ICD-10-CM | POA: Diagnosis not present

## 2021-04-15 DIAGNOSIS — I6522 Occlusion and stenosis of left carotid artery: Secondary | ICD-10-CM | POA: Diagnosis not present

## 2021-04-15 DIAGNOSIS — R2689 Other abnormalities of gait and mobility: Secondary | ICD-10-CM | POA: Diagnosis not present

## 2021-04-15 DIAGNOSIS — M069 Rheumatoid arthritis, unspecified: Secondary | ICD-10-CM | POA: Diagnosis not present

## 2021-04-15 DIAGNOSIS — L7632 Postprocedural hematoma of skin and subcutaneous tissue following other procedure: Secondary | ICD-10-CM | POA: Diagnosis not present

## 2021-04-15 DIAGNOSIS — I252 Old myocardial infarction: Secondary | ICD-10-CM | POA: Diagnosis not present

## 2021-04-15 DIAGNOSIS — Z955 Presence of coronary angioplasty implant and graft: Secondary | ICD-10-CM

## 2021-04-15 DIAGNOSIS — E559 Vitamin D deficiency, unspecified: Secondary | ICD-10-CM | POA: Diagnosis not present

## 2021-04-15 DIAGNOSIS — I2510 Atherosclerotic heart disease of native coronary artery without angina pectoris: Secondary | ICD-10-CM | POA: Diagnosis present

## 2021-04-15 DIAGNOSIS — H9190 Unspecified hearing loss, unspecified ear: Secondary | ICD-10-CM | POA: Diagnosis present

## 2021-04-15 HISTORY — DX: Atherosclerotic heart disease of native coronary artery without angina pectoris: I25.10

## 2021-04-15 HISTORY — PX: ENDARTERECTOMY: SHX5162

## 2021-04-15 HISTORY — DX: Essential (primary) hypertension: I10

## 2021-04-15 LAB — URINALYSIS, ROUTINE W REFLEX MICROSCOPIC
Bilirubin Urine: NEGATIVE
Glucose, UA: NEGATIVE mg/dL
Ketones, ur: NEGATIVE mg/dL
Leukocytes,Ua: NEGATIVE
Nitrite: NEGATIVE
Protein, ur: 100 mg/dL — AB
Specific Gravity, Urine: 1.025 (ref 1.005–1.030)
pH: 6 (ref 5.0–8.0)

## 2021-04-15 LAB — URINALYSIS, MICROSCOPIC (REFLEX)

## 2021-04-15 LAB — TYPE AND SCREEN
ABO/RH(D): A POS
Antibody Screen: NEGATIVE

## 2021-04-15 LAB — SURGICAL PCR SCREEN
MRSA, PCR: NEGATIVE
Staphylococcus aureus: NEGATIVE

## 2021-04-15 LAB — POCT ACTIVATED CLOTTING TIME: Activated Clotting Time: 383 seconds

## 2021-04-15 LAB — PROTIME-INR
INR: 1.1 (ref 0.8–1.2)
Prothrombin Time: 14.2 seconds (ref 11.4–15.2)

## 2021-04-15 LAB — APTT: aPTT: 35 seconds (ref 24–36)

## 2021-04-15 SURGERY — ENDARTERECTOMY, CAROTID
Anesthesia: General | Site: Neck | Laterality: Left

## 2021-04-15 MED ORDER — BACLOFEN 10 MG PO TABS
10.0000 mg | ORAL_TABLET | Freq: Two times a day (BID) | ORAL | Status: DC
  Administered 2021-04-15 – 2021-04-16 (×2): 10 mg via ORAL
  Filled 2021-04-15 (×3): qty 1

## 2021-04-15 MED ORDER — POLYETHYLENE GLYCOL 3350 17 G PO PACK: 17.0000 g | PACK | Freq: Every day | ORAL | Status: DC | PRN

## 2021-04-15 MED ORDER — ROCURONIUM BROMIDE 10 MG/ML (PF) SYRINGE
PREFILLED_SYRINGE | INTRAVENOUS | Status: DC | PRN
Start: 2021-04-15 — End: 2021-04-15
  Administered 2021-04-15: 60 mg via INTRAVENOUS

## 2021-04-15 MED ORDER — LACTATED RINGERS IV SOLN: INTRAVENOUS | Status: DC | PRN

## 2021-04-15 MED ORDER — ROSUVASTATIN CALCIUM 20 MG PO TABS
40.0000 mg | ORAL_TABLET | Freq: Every day | ORAL | Status: DC
  Administered 2021-04-15: 40 mg via ORAL
  Filled 2021-04-15: qty 2

## 2021-04-15 MED ORDER — POLYVINYL ALCOHOL 1.4 % OP SOLN
1.0000 [drp] | Freq: Every day | OPHTHALMIC | Status: DC | PRN
  Filled 2021-04-15: qty 15

## 2021-04-15 MED ORDER — EPHEDRINE 5 MG/ML INJ
INTRAVENOUS | Status: AC
  Filled 2021-04-15: qty 5

## 2021-04-15 MED ORDER — ASPIRIN EC 81 MG PO TBEC
81.0000 mg | DELAYED_RELEASE_TABLET | Freq: Every day | ORAL | Status: DC
  Administered 2021-04-16: 81 mg via ORAL
  Filled 2021-04-15: qty 1

## 2021-04-15 MED ORDER — HEPARIN 6000 UNIT IRRIGATION SOLUTION
Status: DC | PRN
  Administered 2021-04-15: 1

## 2021-04-15 MED ORDER — DEXAMETHASONE SODIUM PHOSPHATE 10 MG/ML IJ SOLN
INTRAMUSCULAR | Status: DC | PRN
  Administered 2021-04-15: 10 mg via INTRAVENOUS

## 2021-04-15 MED ORDER — CHLORHEXIDINE GLUCONATE CLOTH 2 % EX PADS: 6.0000 | MEDICATED_PAD | Freq: Once | CUTANEOUS | Status: DC

## 2021-04-15 MED ORDER — SODIUM CHLORIDE 0.9 % IV SOLN
500.0000 mL | Freq: Once | INTRAVENOUS | Status: AC | PRN
  Administered 2021-04-15: 500 mL via INTRAVENOUS

## 2021-04-15 MED ORDER — ACETAMINOPHEN 650 MG RE SUPP: 325.0000 mg | RECTAL | Status: DC | PRN

## 2021-04-15 MED ORDER — HEPARIN SODIUM (PORCINE) 1000 UNIT/ML IJ SOLN
INTRAMUSCULAR | Status: AC
  Filled 2021-04-15: qty 10

## 2021-04-15 MED ORDER — PHENYLEPHRINE HCL-NACL 20-0.9 MG/250ML-% IV SOLN
INTRAVENOUS | Status: AC
  Filled 2021-04-15: qty 500

## 2021-04-15 MED ORDER — HEMOSTATIC AGENTS (NO CHARGE) OPTIME
TOPICAL | Status: DC | PRN
  Administered 2021-04-15: 1 via TOPICAL

## 2021-04-15 MED ORDER — PHENYLEPHRINE HCL-NACL 20-0.9 MG/250ML-% IV SOLN
INTRAVENOUS | Status: DC | PRN
Start: 2021-04-15 — End: 2021-04-15
  Administered 2021-04-15: 60 ug/min via INTRAVENOUS

## 2021-04-15 MED ORDER — DOCUSATE SODIUM 100 MG PO CAPS
100.0000 mg | ORAL_CAPSULE | Freq: Every day | ORAL | Status: DC
  Administered 2021-04-16: 100 mg via ORAL
  Filled 2021-04-15: qty 1

## 2021-04-15 MED ORDER — SODIUM CHLORIDE 0.9 % IV SOLN: INTRAVENOUS | Status: DC

## 2021-04-15 MED ORDER — GLYCOPYRROLATE PF 0.2 MG/ML IJ SOSY
PREFILLED_SYRINGE | INTRAMUSCULAR | Status: AC
  Filled 2021-04-15: qty 1

## 2021-04-15 MED ORDER — ACETAMINOPHEN 500 MG PO TABS
1000.0000 mg | ORAL_TABLET | Freq: Once | ORAL | Status: AC
Start: 2021-04-15 — End: 2021-04-15
  Administered 2021-04-15: 1000 mg via ORAL
  Filled 2021-04-15: qty 2

## 2021-04-15 MED ORDER — HEPARIN 6000 UNIT IRRIGATION SOLUTION
Status: AC
  Filled 2021-04-15: qty 500

## 2021-04-15 MED ORDER — 0.9 % SODIUM CHLORIDE (POUR BTL) OPTIME
TOPICAL | Status: DC | PRN
Start: 2021-04-15 — End: 2021-04-15
  Administered 2021-04-15: 2000 mL

## 2021-04-15 MED ORDER — PROPOFOL 10 MG/ML IV BOLUS
INTRAVENOUS | Status: AC
  Filled 2021-04-15: qty 20

## 2021-04-15 MED ORDER — ALBUMIN HUMAN 5 % IV SOLN
12.5000 g | Freq: Once | INTRAVENOUS | Status: AC
  Administered 2021-04-15: 12.5 g via INTRAVENOUS

## 2021-04-15 MED ORDER — METHOTREXATE 2.5 MG PO TABS
17.5000 mg | ORAL_TABLET | ORAL | Status: DC
  Administered 2021-04-16: 17.5 mg via ORAL
  Filled 2021-04-15: qty 7

## 2021-04-15 MED ORDER — LIDOCAINE 2% (20 MG/ML) 5 ML SYRINGE
INTRAMUSCULAR | Status: AC
  Filled 2021-04-15: qty 5

## 2021-04-15 MED ORDER — ONDANSETRON HCL 4 MG/2ML IJ SOLN
INTRAMUSCULAR | Status: DC | PRN
  Administered 2021-04-15: 4 mg via INTRAVENOUS

## 2021-04-15 MED ORDER — PHENYLEPHRINE 40 MCG/ML (10ML) SYRINGE FOR IV PUSH (FOR BLOOD PRESSURE SUPPORT)
PREFILLED_SYRINGE | INTRAVENOUS | Status: DC | PRN
  Administered 2021-04-15: 80 ug via INTRAVENOUS

## 2021-04-15 MED ORDER — GLYCOPYRROLATE PF 0.2 MG/ML IJ SOSY
PREFILLED_SYRINGE | INTRAMUSCULAR | Status: DC | PRN
  Administered 2021-04-15: .2 mg via INTRAVENOUS

## 2021-04-15 MED ORDER — LIDOCAINE 2% (20 MG/ML) 5 ML SYRINGE
INTRAMUSCULAR | Status: DC | PRN
  Administered 2021-04-15: 100 mg via INTRAVENOUS

## 2021-04-15 MED ORDER — ROCURONIUM BROMIDE 10 MG/ML (PF) SYRINGE
PREFILLED_SYRINGE | INTRAVENOUS | Status: AC
  Filled 2021-04-15: qty 10

## 2021-04-15 MED ORDER — GABAPENTIN 300 MG PO CAPS
300.0000 mg | ORAL_CAPSULE | Freq: Two times a day (BID) | ORAL | Status: DC
  Administered 2021-04-15 – 2021-04-16 (×2): 300 mg via ORAL
  Filled 2021-04-15 (×2): qty 1

## 2021-04-15 MED ORDER — PROTAMINE SULFATE 10 MG/ML IV SOLN
INTRAVENOUS | Status: DC | PRN
Start: 2021-04-15 — End: 2021-04-15
  Administered 2021-04-15: 50 mg via INTRAVENOUS

## 2021-04-15 MED ORDER — CEFAZOLIN SODIUM-DEXTROSE 2-4 GM/100ML-% IV SOLN
2.0000 g | INTRAVENOUS | Status: AC
  Administered 2021-04-15: 2 g via INTRAVENOUS
  Filled 2021-04-15: qty 100

## 2021-04-15 MED ORDER — HEPARIN SODIUM (PORCINE) 1000 UNIT/ML IJ SOLN
INTRAMUSCULAR | Status: DC | PRN
  Administered 2021-04-15: 9000 [IU] via INTRAVENOUS

## 2021-04-15 MED ORDER — FENTANYL CITRATE (PF) 100 MCG/2ML IJ SOLN: 25.0000 ug | INTRAMUSCULAR | Status: DC | PRN

## 2021-04-15 MED ORDER — MORPHINE SULFATE (PF) 2 MG/ML IV SOLN
2.0000 mg | INTRAVENOUS | Status: DC | PRN
  Administered 2021-04-15: 2 mg via INTRAVENOUS
  Filled 2021-04-15: qty 1

## 2021-04-15 MED ORDER — HYDRALAZINE HCL 20 MG/ML IJ SOLN: 5.0000 mg | INTRAMUSCULAR | Status: DC | PRN

## 2021-04-15 MED ORDER — PHENYLEPHRINE 40 MCG/ML (10ML) SYRINGE FOR IV PUSH (FOR BLOOD PRESSURE SUPPORT)
PREFILLED_SYRINGE | INTRAVENOUS | Status: AC
  Filled 2021-04-15: qty 10

## 2021-04-15 MED ORDER — ALUM & MAG HYDROXIDE-SIMETH 200-200-20 MG/5ML PO SUSP: 15.0000 mL | ORAL | Status: DC | PRN

## 2021-04-15 MED ORDER — NITROGLYCERIN 0.4 MG SL SUBL: 0.4000 mg | SUBLINGUAL_TABLET | SUBLINGUAL | Status: DC | PRN

## 2021-04-15 MED ORDER — CEFAZOLIN SODIUM-DEXTROSE 2-4 GM/100ML-% IV SOLN
2.0000 g | Freq: Three times a day (TID) | INTRAVENOUS | Status: AC
  Administered 2021-04-15 (×2): 2 g via INTRAVENOUS
  Filled 2021-04-15 (×2): qty 100

## 2021-04-15 MED ORDER — ONDANSETRON HCL 4 MG/2ML IJ SOLN
INTRAMUSCULAR | Status: AC
  Filled 2021-04-15: qty 2

## 2021-04-15 MED ORDER — ACETAMINOPHEN 325 MG PO TABS: 325.0000 mg | ORAL_TABLET | ORAL | Status: DC | PRN

## 2021-04-15 MED ORDER — GUAIFENESIN-DM 100-10 MG/5ML PO SYRP: 15.0000 mL | ORAL_SOLUTION | ORAL | Status: DC | PRN

## 2021-04-15 MED ORDER — PROPOFOL 10 MG/ML IV BOLUS
INTRAVENOUS | Status: DC | PRN
  Administered 2021-04-15: 30 mg via INTRAVENOUS
  Administered 2021-04-15: 100 mg via INTRAVENOUS

## 2021-04-15 MED ORDER — EPHEDRINE SULFATE-NACL 50-0.9 MG/10ML-% IV SOSY
PREFILLED_SYRINGE | INTRAVENOUS | Status: DC | PRN
  Administered 2021-04-15 (×2): 5 mg via INTRAVENOUS
  Administered 2021-04-15: 2.5 mg via INTRAVENOUS
  Administered 2021-04-15 (×2): 5 mg via INTRAVENOUS
  Administered 2021-04-15: 2.5 mg via INTRAVENOUS

## 2021-04-15 MED ORDER — LIDOCAINE HCL (PF) 1 % IJ SOLN
INTRAMUSCULAR | Status: AC
  Filled 2021-04-15: qty 30

## 2021-04-15 MED ORDER — CHLORHEXIDINE GLUCONATE 0.12 % MT SOLN
OROMUCOSAL | Status: AC
  Administered 2021-04-15: 15 mL
  Filled 2021-04-15: qty 15

## 2021-04-15 MED ORDER — DEXAMETHASONE SODIUM PHOSPHATE 10 MG/ML IJ SOLN
INTRAMUSCULAR | Status: AC
  Filled 2021-04-15: qty 1

## 2021-04-15 MED ORDER — OXYCODONE-ACETAMINOPHEN 5-325 MG PO TABS
1.0000 | ORAL_TABLET | ORAL | Status: DC | PRN
  Administered 2021-04-15: 2 via ORAL
  Filled 2021-04-15: qty 2

## 2021-04-15 MED ORDER — PROTAMINE SULFATE 10 MG/ML IV SOLN
INTRAVENOUS | Status: AC
  Filled 2021-04-15: qty 5

## 2021-04-15 MED ORDER — SODIUM CHLORIDE 0.9 % IV SOLN
0.1500 ug/kg/min | INTRAVENOUS | Status: DC
  Administered 2021-04-15: .15 ug/kg/min via INTRAVENOUS
  Filled 2021-04-15: qty 2000

## 2021-04-15 MED ORDER — PHENOL 1.4 % MT LIQD
1.0000 | OROMUCOSAL | Status: DC | PRN
  Administered 2021-04-15: 1 via OROMUCOSAL
  Filled 2021-04-15: qty 177

## 2021-04-15 MED ORDER — BISACODYL 10 MG RE SUPP: 10.0000 mg | Freq: Every day | RECTAL | Status: DC | PRN

## 2021-04-15 MED ORDER — METOPROLOL SUCCINATE ER 25 MG PO TB24
25.0000 mg | ORAL_TABLET | Freq: Every day | ORAL | Status: DC
  Filled 2021-04-15: qty 1

## 2021-04-15 MED ORDER — LABETALOL HCL 5 MG/ML IV SOLN: 10.0000 mg | INTRAVENOUS | Status: DC | PRN

## 2021-04-15 MED ORDER — FENTANYL CITRATE (PF) 250 MCG/5ML IJ SOLN
INTRAMUSCULAR | Status: AC
  Filled 2021-04-15: qty 5

## 2021-04-15 MED ORDER — METOPROLOL TARTRATE 5 MG/5ML IV SOLN: 2.0000 mg | INTRAVENOUS | Status: DC | PRN

## 2021-04-15 MED ORDER — FOLIC ACID 1 MG PO TABS
1.0000 mg | ORAL_TABLET | Freq: Every day | ORAL | Status: DC
  Administered 2021-04-15 – 2021-04-16 (×2): 1 mg via ORAL
  Filled 2021-04-15 (×2): qty 1

## 2021-04-15 MED ORDER — SUGAMMADEX SODIUM 200 MG/2ML IV SOLN
INTRAVENOUS | Status: DC | PRN
  Administered 2021-04-15: 200 mg via INTRAVENOUS

## 2021-04-15 MED ORDER — ALBUMIN HUMAN 5 % IV SOLN
INTRAVENOUS | Status: AC
  Filled 2021-04-15: qty 250

## 2021-04-15 MED ORDER — ONDANSETRON HCL 4 MG/2ML IJ SOLN: 4.0000 mg | Freq: Once | INTRAMUSCULAR | Status: DC | PRN

## 2021-04-15 MED ORDER — ONDANSETRON HCL 4 MG/2ML IJ SOLN: 4.0000 mg | Freq: Four times a day (QID) | INTRAMUSCULAR | Status: DC | PRN

## 2021-04-15 MED ORDER — TAMSULOSIN HCL 0.4 MG PO CAPS
0.8000 mg | ORAL_CAPSULE | Freq: Every day | ORAL | Status: DC
  Administered 2021-04-16: 0.8 mg via ORAL
  Filled 2021-04-15: qty 2

## 2021-04-15 MED ORDER — POTASSIUM CHLORIDE CRYS ER 20 MEQ PO TBCR: 20.0000 meq | EXTENDED_RELEASE_TABLET | Freq: Every day | ORAL | Status: DC | PRN

## 2021-04-15 MED ORDER — MAGNESIUM SULFATE 2 GM/50ML IV SOLN: 2.0000 g | Freq: Every day | INTRAVENOUS | Status: DC | PRN

## 2021-04-15 MED ORDER — PANTOPRAZOLE SODIUM 40 MG PO TBEC
40.0000 mg | DELAYED_RELEASE_TABLET | Freq: Every day | ORAL | Status: DC
  Administered 2021-04-15 – 2021-04-16 (×2): 40 mg via ORAL
  Filled 2021-04-15 (×2): qty 1

## 2021-04-15 MED ORDER — FENTANYL CITRATE (PF) 250 MCG/5ML IJ SOLN
INTRAMUSCULAR | Status: DC | PRN
  Administered 2021-04-15: 100 ug via INTRAVENOUS

## 2021-04-15 SURGICAL SUPPLY — 57 items
ADH SKN CLS APL DERMABOND .7 (GAUZE/BANDAGES/DRESSINGS) ×1
ADPR TBG 2 MALE LL ART (MISCELLANEOUS)
BAG COUNTER SPONGE SURGICOUNT (BAG) ×2 IMPLANT
BAG SPNG CNTER NS LX DISP (BAG) ×1
CANISTER SUCT 3000ML PPV (MISCELLANEOUS) ×2 IMPLANT
CATH ROBINSON RED A/P 18FR (CATHETERS) ×2 IMPLANT
CLIP VESOCCLUDE MED 24/CT (CLIP) ×2 IMPLANT
CLIP VESOCCLUDE SM WIDE 24/CT (CLIP) ×2 IMPLANT
COVER PROBE W GEL 5X96 (DRAPES) ×1 IMPLANT
COVER TRANSDUCER ULTRASND GEL (DISPOSABLE) ×2 IMPLANT
DERMABOND ADVANCED (GAUZE/BANDAGES/DRESSINGS) ×1
DERMABOND ADVANCED .7 DNX12 (GAUZE/BANDAGES/DRESSINGS) ×1 IMPLANT
DRAIN CHANNEL 15F RND FF W/TCR (WOUND CARE) IMPLANT
ELECT REM PT RETURN 9FT ADLT (ELECTROSURGICAL) ×2
ELECTRODE REM PT RTRN 9FT ADLT (ELECTROSURGICAL) ×1 IMPLANT
EVACUATOR SILICONE 100CC (DRAIN) IMPLANT
GAUZE 4X4 16PLY ~~LOC~~+RFID DBL (SPONGE) ×2 IMPLANT
GLOVE SRG 8 PF TXTR STRL LF DI (GLOVE) ×1 IMPLANT
GLOVE SURG ENC MOIS LTX SZ7.5 (GLOVE) ×2 IMPLANT
GLOVE SURG UNDER POLY LF SZ8 (GLOVE) ×2
GOWN STRL REUS W/ TWL LRG LVL3 (GOWN DISPOSABLE) ×2 IMPLANT
GOWN STRL REUS W/ TWL XL LVL3 (GOWN DISPOSABLE) ×2 IMPLANT
GOWN STRL REUS W/TWL LRG LVL3 (GOWN DISPOSABLE) ×4
GOWN STRL REUS W/TWL XL LVL3 (GOWN DISPOSABLE) ×4
HEMOSTAT SNOW SURGICEL 2X4 (HEMOSTASIS) ×1 IMPLANT
IV ADAPTER SYR DOUBLE MALE LL (MISCELLANEOUS) IMPLANT
KIT BASIN OR (CUSTOM PROCEDURE TRAY) ×2 IMPLANT
KIT SHUNT ARGYLE CAROTID ART 6 (VASCULAR PRODUCTS) ×1 IMPLANT
KIT TURNOVER KIT B (KITS) ×2 IMPLANT
LOOP VESSEL MINI RED (MISCELLANEOUS) ×1 IMPLANT
NDL HYPO 25GX1X1/2 BEV (NEEDLE) IMPLANT
NDL SPNL 20GX3.5 QUINCKE YW (NEEDLE) IMPLANT
NEEDLE HYPO 25GX1X1/2 BEV (NEEDLE) IMPLANT
NEEDLE SPNL 20GX3.5 QUINCKE YW (NEEDLE) IMPLANT
NS IRRIG 1000ML POUR BTL (IV SOLUTION) ×6 IMPLANT
PACK CAROTID (CUSTOM PROCEDURE TRAY) ×2 IMPLANT
PAD ARMBOARD 7.5X6 YLW CONV (MISCELLANEOUS) ×4 IMPLANT
PATCH VASC XENOSURE 1CMX6CM (Vascular Products) ×2 IMPLANT
PATCH VASC XENOSURE 1X6 (Vascular Products) IMPLANT
POSITIONER HEAD DONUT 9IN (MISCELLANEOUS) ×2 IMPLANT
SHUNT CAROTID BYPASS 10 (VASCULAR PRODUCTS) IMPLANT
SHUNT CAROTID BYPASS 12FRX15.5 (VASCULAR PRODUCTS) IMPLANT
SPONGE SURGIFOAM ABS GEL 100 (HEMOSTASIS) IMPLANT
SPONGE T-LAP 18X18 ~~LOC~~+RFID (SPONGE) ×1 IMPLANT
STOPCOCK 4 WAY LG BORE MALE ST (IV SETS) IMPLANT
SUT ETHILON 3 0 PS 1 (SUTURE) IMPLANT
SUT MNCRL AB 4-0 PS2 18 (SUTURE) ×2 IMPLANT
SUT PROLENE 5 0 C 1 24 (SUTURE) ×2 IMPLANT
SUT PROLENE 6 0 BV (SUTURE) ×2 IMPLANT
SUT SILK 3 0 (SUTURE)
SUT SILK 3-0 18XBRD TIE 12 (SUTURE) IMPLANT
SUT VIC AB 3-0 SH 27 (SUTURE) ×2
SUT VIC AB 3-0 SH 27X BRD (SUTURE) ×1 IMPLANT
SYR CONTROL 10ML LL (SYRINGE) IMPLANT
TOWEL GREEN STERILE (TOWEL DISPOSABLE) ×2 IMPLANT
TUBING ART PRESS 48 MALE/FEM (TUBING) IMPLANT
WATER STERILE IRR 1000ML POUR (IV SOLUTION) ×2 IMPLANT

## 2021-04-15 NOTE — Progress Notes (Addendum)
Pt arrived to 4e from PACU. Pt oriented to room and staff. CHG bath done. Vitals obtained. Aline zeroed. Pt educated on NPO order. NIH completed. Left neck incision soft and puffy, but intact. MD and PA aware.

## 2021-04-15 NOTE — Discharge Instructions (Addendum)
   [facility]  Discharge Instructions   Carotid Surgery  Please refer to the following instructions for your post-procedure care. Your surgeon or physician assistant will discuss any changes with you.  Activity  You are encouraged to walk as much as you can. You can slowly return to normal activities but must avoid strenuous activity and heavy lifting until your doctor tell you it's okay. Avoid activities such as vacuuming or swinging a golf club. You can drive after one week if you are comfortable and you are no longer taking prescription pain medications. It is normal to feel tired for serval weeks after your surgery. It is also normal to have difficulty with sleep habits, eating, and bowel movements after surgery. These will go away with time.  Bathing/Showering  Shower daily after you go home. Do not soak in a bathtub, hot tub, or swim until the incision heals completely.  Incision Care  Shower every day. Clean your incision with mild soap and water. Pat the area dry with a clean towel. You do not need a bandage unless otherwise instructed. Do not apply any ointments or creams to your incision. You may have skin glue on your incision. Do not peel it off. It will come off on its own in about one week. Your incision may feel thickened and raised for several weeks after your surgery. This is normal and the skin will soften over time.   For Men Only: It's okay to shave around the incision but do not shave the incision itself for 2 weeks. It is common to have numbness under your chin that could last for several months.  Diet  Resume your normal diet. There are no special food restrictions following this procedure. A low fat/low cholesterol diet is recommended for all patients with vascular disease. In order to heal from your surgery, it is CRITICAL to get adequate nutrition. Your body requires vitamins, minerals, and protein. Vegetables are the best source of  vitamins and minerals. Vegetables also provide the perfect balance of protein. Processed food has little nutritional value, so try to avoid this.  Medications  Resume taking all of your medications unless your doctor or physician assistant tells you not to. If your incision is causing pain, you may take over-the- counter pain relievers such as acetaminophen (Tylenol). If you were prescribed a stronger pain medication, please be aware these medications can cause nausea and constipation. Prevent nausea by taking the medication with a snack or meal. Avoid constipation by drinking plenty of fluids and eating foods with a high amount of fiber, such as fruits, vegetables, and grains.   Do not take Tylenol if you are taking prescription pain medications.  Follow Up  Our office will schedule a follow up appointment 2-3 weeks following discharge.  Please call us immediately for any of the following conditions  . Increased pain, redness, drainage (pus) from your incision site. . Fever of 101 degrees or higher. . If you should develop stroke (slurred speech, difficulty swallowing, weakness on one side of your body, loss of vision) you should call 911 and go to the nearest emergency room. .  Reduce your risk of vascular disease:  . Stop smoking. If you would like help call [facility] at 1-800-QUIT-NOW (1-800-784-8669) or [place] at 336-586-4000. . Manage your cholesterol . Maintain a desired weight . Control your diabetes . Keep your blood pressure down .  If you have any questions, please call the office at [phone]. 

## 2021-04-15 NOTE — Anesthesia Procedure Notes (Addendum)
Procedure Name: Intubation Date/Time: 04/15/2021 7:43 AM Performed by: Barrington Ellison, CRNA Pre-anesthesia Checklist: Patient identified, Emergency Drugs available, Suction available and Patient being monitored Patient Re-evaluated:Patient Re-evaluated prior to induction Oxygen Delivery Method: Circle System Utilized Preoxygenation: Pre-oxygenation with 100% oxygen Induction Type: IV induction Ventilation: Mask ventilation without difficulty Laryngoscope Size: Mac and 4 Grade View: Grade I Tube type: Oral Tube size: 7.5 mm Number of attempts: 1 Airway Equipment and Method: Stylet Placement Confirmation: ETT inserted through vocal cords under direct vision, positive ETCO2 and breath sounds checked- equal and bilateral Secured at: 23 cm Tube secured with: Tape Dental Injury: Teeth and Oropharynx as per pre-operative assessment  Comments: Placed by Lucita Ferrara, SRNA under supervision of MDA and CRNA

## 2021-04-15 NOTE — Anesthesia Postprocedure Evaluation (Signed)
Anesthesia Post Note  Patient: Austin Cox  Procedure(s) Performed: LEFT CAROTID ENDARTERECTOMY WITH (Left: Neck)     Patient location during evaluation: PACU Anesthesia Type: General Level of consciousness: awake and alert Pain management: pain level controlled Vital Signs Assessment: post-procedure vital signs reviewed and stable Respiratory status: spontaneous breathing, nonlabored ventilation and respiratory function stable Cardiovascular status: blood pressure returned to baseline and stable Postop Assessment: no apparent nausea or vomiting Anesthetic complications: no   No notable events documented.  Last Vitals:  Vitals:   04/15/21 1152 04/15/21 1215  BP:  97/62  Pulse: 60 62  Resp: 16 16  Temp:  (!) 36.3 C  SpO2: 94% 94%    Last Pain:  Vitals:   04/15/21 1215  TempSrc: Oral  PainSc:                  Santa Lighter

## 2021-04-15 NOTE — Progress Notes (Signed)
Pt with soft hematoma on left neck incision. Pressure applied for about 20 minutes by Dr. Chestine Spore and this RN. Pressure bag placed by Dr. Chestine Spore and pt positioned on right side. Also, in and out cath done at bedside. Pt has not voided since surgery. Bladder scan shows 456 mL of urine. In and out cath got 350 mL of urine out.

## 2021-04-15 NOTE — Progress Notes (Signed)
PHARMACIST LIPID MONITORING   Austin Cox is a 76 y.o. male admitted on 04/15/2021 with left high graft asymptomatic carotid stenosis (>80%).  Pharmacy has been consulted to optimize lipid-lowering therapy with the indication of secondary prevention for clinical ASCVD.  Recent Labs:  Lipid Panel (last 6 months):   Lab Results  Component Value Date   CHOL 78 (L) 03/19/2021   TRIG 145 03/19/2021   HDL 20 (L) 03/19/2021   CHOLHDL 3.9 03/19/2021   LDLCALC 33 03/19/2021    Hepatic function panel (last 6 months):   Lab Results  Component Value Date   AST 13 03/19/2021   ALT 9 03/19/2021   ALKPHOS 70 03/19/2021   BILITOT 0.6 03/19/2021    SCr (since admission):   Creatinine clearance cannot be calculated (Patient's most recent lab result is older than the maximum 21 days allowed.)  Current therapy and lipid therapy tolerance Current lipid-lowering therapy: Crestor 40 mg Previous lipid-lowering therapies (if applicable): n/a Documented or reported allergies or intolerances to lipid-lowering therapies (if applicable): n/a  Assessment:   No changes at this time  Plan:    1.Statin intensity (high intensity recommended for all patients regardless of the LDL):  No statin changes. The patient is already on a high intensity statin.  2.Add ezetimibe (if any one of the following):   Not indicated at this time.  3.Refer to lipid clinic:   No  4.Follow-up with:  Primary care provider - Rakes, Connye Burkitt, FNP  5.Follow-up labs after discharge:  No changes in lipid therapy, repeat a lipid panel in one year.        Thank you for allowing [place] to participate in this patients care. Jens Som, PharmD 04/15/2021 11:40 AM  **Pharmacist phone directory can be found on [email or web address] listed under [place]**

## 2021-04-15 NOTE — Progress Notes (Signed)
°  Day of Surgery Note    Subjective:  resting comfortably   Vitals:   04/15/21 1020 04/15/21 1035  BP: 98/63 (!) 94/56  Pulse: 70 70  Resp: 13 12  Temp:    SpO2: 95% 96%    Incisions:   clean and dry with only mild fullness; soft Neuro:  moving all extremities equally; tongue is midline    Assessment/Plan:  This is a 76 y.o. male who is s/p  Left CEA for asymptomatic carotid artery stenosis  -pt doing well in recovery with neuro in tact-transferring to [place] now. -mild fullness around incision but soft -plan for dc tomorrow if no issues overnight -PDMP reviewed.   Leontine Locket, PA-C 04/15/2021 10:54 AM [phone]

## 2021-04-15 NOTE — Transfer of Care (Signed)
Immediate Anesthesia Transfer of Care Note  Patient: Austin SkeansJimmy R Schneider  Procedure(s) Performed: LEFT CAROTID ENDARTERECTOMY (Left)  Patient Location: PACU  Anesthesia Type:General  Level of Consciousness: awake, alert  and oriented  Airway & Oxygen Therapy: Patient Spontanous Breathing  Post-op Assessment: Report given to RN, Post -op Vital signs reviewed and stable, Patient moving all extremities, Patient moving all extremities X 4 and Patient able to stick tongue midline  Post vital signs: Reviewed and stable  Last Vitals:  Vitals Value Taken Time  BP 126/72 04/15/21 0950  Temp    Pulse 76 04/15/21 0950  Resp 15 04/15/21 0951  SpO2 92 % 04/15/21 0950  Vitals shown include unvalidated device data.  Last Pain:  Vitals:   04/15/21 0626  TempSrc:   PainSc: 0-No pain         Complications: No notable events documented.

## 2021-04-15 NOTE — Op Note (Addendum)
OPERATIVE NOTE  PROCEDURE:   1.  left carotid endarterectomy with bovine patch angioplasty 2.  left intraoperative carotid ultrasound  PRE-OPERATIVE DIAGNOSIS: left high grade asymptomatic carotid stenosis (>80%)  POST-OPERATIVE DIAGNOSIS: same as above   SURGEON: Cephus Shelling, MD  ASSISTANT(S): Doreatha Massed, PA  ANESTHESIA: general  ESTIMATED BLOOD LOSS: <100 mL  FINDING(S): High-grade ulcerated left internal carotid artery plaque.  After endarterectomy and bovine patch angioplasty, intraoperative ultrasound showed no evidence of intimal flap.  There was excellent Doppler flow in the internal carotid and external carotid arteries.  SPECIMEN(S):  Carotid plaque (sent to Pathology)  INDICATIONS:   Austin Cox is a 76 y.o. male who presents with left asymptomatic high grade carotid stenosis >80% with string sign.  I discussed with the patient the risks, benefits, and alternatives to carotid endarterectomy.  I discussed the procedural details of carotid endarterectomy with the patient.  The patient is aware that the risks of carotid endarterectomy include but are not limited to: bleeding, infection, stroke, myocardial infarction, death, cranial nerve injuries both temporary and permanent, neck hematoma, possible airway compromise, labile blood pressure post-operatively, cerebral hyperperfusion syndrome, and possible need for additional interventions in the future. The patient is aware of the risks and agrees to proceed forward with the procedure.  An assistant was needed for exposure and to expedite the case.  DESCRIPTION: After full informed written consent was obtained from the patient, the patient was brought back to the operating room and placed supine upon the operating table.  Prior to induction, the patient received IV antibiotics.  After obtaining adequate anesthesia, the patient was placed into semi-Fowler position with a shoulder roll in place and the patient's  neck slightly hyperextended and rotated away from the surgical site.  The patient was prepped in the standard fashion for a left carotid endarterectomy.  I made an incision anterior to the sternocleidomastoid muscle and dissected down through the subcutaneous tissue.  The platysma was opened with electrocautery.  I then used Bovie cautery and blunt dissection to dissect through the underlying platysma and to mobilize the anterior border of the sternocleidomastoid as well as the internal jugular vein laterally.  The facial vein was ligated with 3-0 silk and surgical clips and divided.  After identifying the carotid artery I used Metzenbaum scissors to bluntly dissect the common carotid artery and then controlled this with a large umbilical tape.  At this point in time the patient was given 100 units/kg of IV heparin and we checked an ACT to ensure it was greater than 250.  I then carried my dissection cephalad and mobilized the external carotid artery and superior thyroid artery and controlled each of these with a vessel loop.  I then dissected out the internal carotid artery well past the distal plaque.  The internal carotid artery was then controlled with a umbilical tape as well. I was careful to identify the vagus nerve between the internal jugular and common carotid and this was presereved.  I was also careful to identify and preserve the hypoglossal nerve and this was preserved.    Once our ACT was confirmed, I proceeded by clamping the internal carotid artery with a angled bulldog clamp first.  The proximal common carotid artery was controlled with a angled debakey clamp.  The external carotid was controlled with vessel loops.  I subsequently opened the common carotid artery with an 11 blade scalpel in longitudinal fashion and extended the arteriotomy with Potts scissors onto the ICA  past the distal plaque.  There was pulsatile backbleeding from the internal carotid artery and I elected not to place a shunt.   I then used a Garment/textile technologist and performed a endarterectomy starting in the common carotid artery.  The external carotid artery was endarterectomized with an eversion technique and I was careful to feather the distal ICA plaque.  The specimen was passed off the field.  The endarterectomy site was then flushed with heparinized saline and I was careful to ensure there were no flaps in the endarterectomy site.  I then brought a bovine carotid patch on the field and this was sewn in place with a running anastomosis using a 6-0 Prolene distally and a 5-0 proximal.  The bovine patch was trimmed accordingly.  The artery was flushed antegrade and retrograde prior to completion of the patch.  Once the patch was complete, I flushed up the external carotid artery first prior to releasing the internal carotid artery clamp.  An intraoperative duplex was performed that showed no evidence of any flaps.  I also used a doppler and this confirmed excellent flow in the internal and external carotid arteries.  Once I was happy with the intraoperative ultrasound the patient was given protamine for reversal.  I used surgicel snow to get hemostasis around the patch.  Ultimately the platysma was closed in running fashion with 3-0 Vicryl.  The skin was closed with a running 4-0 Monocryl.  Dermabond was applied with a dry sterile dressing.  The patient was awakened from anesthesia with no new neurological deficit and taken to PACU in stable condition.    COMPLICATIONS: None  CONDITION: Stable  Marty Heck, MD [facility] Office: [place]   04/15/2021, 9:51 AM

## 2021-04-15 NOTE — Anesthesia Procedure Notes (Signed)
Arterial Line Insertion Start/End1/30/2023 7:00 AM, 04/15/2021 7:05 AM Performed by: Lucita Ferrara, RN  Patient location: Pre-op. Preanesthetic checklist: patient identified, IV checked and risks and benefits discussed Lidocaine 1% used for infiltration Left, radial was placed Catheter size: 20 G Maximum sterile barriers used   Attempts: 1 Procedure performed without using ultrasound guided technique. Following insertion, dressing applied and Biopatch. Post procedure assessment: normal  Patient tolerated the procedure well with no immediate complications.

## 2021-04-15 NOTE — H&P (Signed)
History and Physical Interval Note:  04/15/2021 7:27 AM  Rosanne Sack  has presented today for surgery, with the diagnosis of Left carotid artery stenosis.  The various methods of treatment have been discussed with the patient and family. After consideration of risks, benefits and other options for treatment, the patient has consented to  Procedure(s): LEFT CAROTID ENDARTERECTOMY (Left) as a surgical intervention.  The patient's history has been reviewed, patient examined, no change in status, stable for surgery.  I have reviewed the patient's chart and labs.  Questions were answered to the patient's satisfaction.    Left carotid endarterectomy  Marty Heck  Patient name: Austin Cox         MRN: [id number]        DOB: Mar 02, 1946            Sex: male   REASON FOR CONSULT: Evaluate carotid artery stenosis   HPI: KEWAUN FORINASH is a 76 y.o. male, with history of coronary artery disease status post RCA stenting in 2017 at [facility], rheumatoid arthritis, carotid stenosis that presents for evaluation of carotid artery disease.  Patient is here with his wife who states that his PCP heard a bruit that prompted further work-up with ultrasound.  He denies any history of TIAs or strokes.  He is neurologically intact today.  He had a carotid ultrasound on 03/29/2021 that showed large amount of left-sided atherosclerotic plaque with sonographic string sign.  He also had a recent MRI that showed no acute evidence of intracranial abnormality.  He does endorse dizziness and gait instability.  Denies CP or SOB at this time.       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 No pertinent family history.   SOCIAL HISTORY: Social History          Socioeconomic History   Marital status: Married      Spouse name: Not on file   Number of children: 3   Years of education: Not on file   Highest education level: Not on file  Occupational History   Occupation: retired  Tobacco Use   Smoking status: Former      Packs/day: 0.20      Years: 10.00      Pack years: 2.00      Types: Cigarettes      Quit date: 07/06/1974      Years since quitting: 46.7   Smokeless tobacco: Never  Substance and Sexual Activity   Alcohol use: Yes   Drug use: Never   Sexual activity: Not on file  Other Topics Concern   Not on file  Social History Narrative    3 children - one passed away    Son in [place]    They raised 2 teenage grandchildren after their daughter and son in law both passed away     Social Determinants of Health       Financial Resource Strain: Medium Risk   Difficulty of Paying Living Expenses: Somewhat hard  Food Insecurity: Food Insecurity Present   Worried About Running Out of Food in the Last Year: Often true  Ran Out of Food in the Last Year: Sometimes true  Transportation Needs: No Transportation Needs   Lack of Transportation (Medical): No   Lack of Transportation (Non-Medical): No  Physical Activity: Insufficiently Active   Days of Exercise per Week: 6 days   Minutes of Exercise per Session: 20 min  Stress: No Stress Concern Present   Feeling of Stress : Not at all  Social Connections: Moderately Isolated   Frequency of Communication with Friends and Family: More than three times a week   Frequency of Social Gatherings with Friends and Family: Once a week   Attends Religious Services: Never   Marine scientist or Organizations: No   Attends Music therapist: Never   Marital Status: Married  Human resources officer Violence: Not At Risk   Fear of Current or Ex-Partner: No   Emotionally Abused: No   Physically Abused: No   Sexually Abused: No      No Known Allergies         Current Outpatient  Medications  Medication Sig Dispense Refill   aspirin 81 MG EC tablet Take by mouth.       baclofen (LIORESAL) 10 MG tablet Take by mouth.       Cholecalciferol 25 MCG (1000 UT) capsule Take 1 capsule (1,000 Units total) by mouth daily. 90 capsule 2   diclofenac Sodium (VOLTAREN) 1 % GEL SMARTSIG:Gram(s) Topical 4 Times Daily PRN       etanercept (ENBREL SURECLICK) 50 MG/ML injection INJECT 50MG  SUBCUTANEOUSLY  WEEKLY       folic acid (FOLVITE) 1 MG tablet Take by mouth.       gabapentin (NEURONTIN) 300 MG capsule Take 1 capsule by mouth 2 (two) times daily.       methotrexate (RHEUMATREX) 2.5 MG tablet TAKE 7 TABLETS BY MOUTH  ONCE WEEKLY       metoprolol succinate (TOPROL-XL) 25 MG 24 hr tablet Take 1 tablet (25 mg total) by mouth daily. 90 tablet 2   nitroGLYCERIN (NITROSTAT) 0.3 MG SL tablet PLACE 1 TABLET UNDER THE TONGUE EVERY 5 MINS AS NEEDED FOR CHEST PAIN       rosuvastatin (CRESTOR) 40 MG tablet Take 1 tablet (40 mg total) by mouth daily. 90 tablet 2   tamsulosin (FLOMAX) 0.4 MG CAPS capsule Take 2 capsules (0.8 mg total) by mouth daily. 30 capsule 6    No current facility-administered medications for this visit.      REVIEW OF SYSTEMS:  [X]  denotes positive finding, [ ]  denotes negative finding Cardiac   Comments:  Chest pain or chest pressure:      Shortness of breath upon exertion:      Short of breath when lying flat:      Irregular heart rhythm:             Vascular      Pain in calf, thigh, or hip brought on by ambulation:      Pain in feet at night that wakes you up from your sleep:       Blood clot in your veins:      Leg swelling:              Pulmonary      Oxygen at home:      Productive cough:       Wheezing:              Neurologic      Sudden weakness in arms or legs:  Sudden numbness in arms or legs:       Sudden onset of difficulty speaking or slurred speech:      Temporary loss of vision in one eye:       Problems with dizziness:               Gastrointestinal      Blood in stool:       Vomited blood:              Genitourinary      Burning when urinating:       Blood in urine:             Psychiatric      Major depression:              Hematologic      Bleeding problems:      Problems with blood clotting too easily:             Skin      Rashes or ulcers:             Constitutional      Fever or chills:          PHYSICAL EXAM:     Vitals:    04/09/21 1027 04/09/21 1029  BP: 121/75 122/75  Pulse: 80 84  Resp: 18    Temp: 98 F (36.7 C)    TempSrc: Temporal    SpO2: 95%    Weight: 214 lb (97.1 kg)    Height: 6' (1.829 m)        GENERAL: The patient is a well-nourished male, in no acute distress. The vital signs are documented above. CARDIAC: There is a regular rate and rhythm.  VASCULAR:  No previous neck incisions PULMONARY: No respiratory distress. ABDOMEN: Soft and non-tender. MUSCULOSKELETAL: There are no major deformities or cyanosis. NEUROLOGIC: No focal weakness or paresthesias are detected.  CN II-XII grossly intact. SKIN: There are no ulcers or rashes noted. PSYCHIATRIC: The patient has a normal affect.   DATA:    Carotid duplex 03/29/2021 shows large amount of eccentric plaque in the origin and proximal left ICA with string sign and markedly elevated peak systolic velocity of [id number].   Assessment/Plan:   77 year old male with high-grade asymptomatic left internal carotid artery stenosis with sonographic string sign identified on ultrasound.  Discussed that for asymptomatic disease current recommendations are for carotid intervention for greater than 80% stenosis if asymptomatic.  Discussed this is for stroke risk reduction.  I have recommended a left carotid endarterectomy for stroke risk reduction.  I discussed this being done under general anesthesia at [facility] and I discussed the basic steps of the operation.  I discussed risk and benefits including risk of anesthesia, risk of  cardiac event, 1% risk of perioperative stroke as well as bleeding infection.  Patient and family wish to proceed.  We will get scheduled for Monday.     Marty Heck, MD [facility] [place] Office: [phone]

## 2021-04-16 ENCOUNTER — Encounter (HOSPITAL_COMMUNITY): Payer: Self-pay | Admitting: Vascular Surgery

## 2021-04-16 LAB — CBC
HCT: 31.7 % — ABNORMAL LOW (ref 39.0–52.0)
Hemoglobin: 10.2 g/dL — ABNORMAL LOW (ref 13.0–17.0)
MCH: 28.1 pg (ref 26.0–34.0)
MCHC: 32.2 g/dL (ref 30.0–36.0)
MCV: 87.3 fL (ref 80.0–100.0)
Platelets: 75 10*3/uL — ABNORMAL LOW (ref 150–400)
RBC: 3.63 MIL/uL — ABNORMAL LOW (ref 4.22–5.81)
RDW: 16.5 % — ABNORMAL HIGH (ref 11.5–15.5)
WBC: 13.1 10*3/uL — ABNORMAL HIGH (ref 4.0–10.5)
nRBC: 0 % (ref 0.0–0.2)

## 2021-04-16 LAB — BASIC METABOLIC PANEL
Anion gap: 6 (ref 5–15)
BUN: 13 mg/dL (ref 8–23)
CO2: 23 mmol/L (ref 22–32)
Calcium: 9 mg/dL (ref 8.9–10.3)
Chloride: 107 mmol/L (ref 98–111)
Creatinine, Ser: 1 mg/dL (ref 0.61–1.24)
GFR, Estimated: >60 mL/min (ref >60)
Glucose, Bld: 100 mg/dL — ABNORMAL HIGH (ref 70–99)
Potassium: 4.1 mmol/L (ref 3.5–5.1)
Sodium: 136 mmol/L (ref 135–145)

## 2021-04-16 LAB — LIPID PANEL
Cholesterol: 66 mg/dL (ref 0–200)
HDL: 20 mg/dL — ABNORMAL LOW (ref >40)
LDL Cholesterol: 34 mg/dL (ref 0–99)
Total CHOL/HDL Ratio: 3.3 RATIO
Triglycerides: 58 mg/dL (ref <150)
VLDL: 12 mg/dL (ref 0–40)

## 2021-04-16 MED ORDER — HYDROCODONE-ACETAMINOPHEN 5-325 MG PO TABS: 1.0000 | ORAL_TABLET | Freq: Four times a day (QID) | ORAL | 0 refills | Status: DC | PRN

## 2021-04-16 NOTE — Discharge Summary (Addendum)
Carotid Discharge Summary     Austin Cox 1945/10/31 76 y.o. male  [id number]  Admission Date: 04/15/2021  Discharge Date: 04/16/2021  Physician: Cephus Shelling, MD  Admission Diagnosis: Asymptomatic carotid artery stenosis without infarction, left [facility] Hospital Course:  The patient was admitted to the hospital and taken to the operating room on 04/15/2021 and underwent left carotid endarterectomy. The pt tolerated the procedure well and was transported to the PACU in good condition.  He developed a hematoma in left side of neck along incision. Pressure hemostasis applied for 20 minutes with no further expansion of hematoma. Was not able to void either so had to have in and out cath.    By POD 1, the pt neuro status remained intact. Left neck incision intact. Left neck hematoma stable. Hemodynamically stable. Voiding without difficulty. Pain well controlled. The remainder of the hospital course consisted of increasing mobilization and increasing intake of solids without difficulty.  Patient remained stable for discharge home. PDMP reviewed and post operative pain medication sent to patients pharmacy. He will continue Aspirin and statin along with resuming home medications as prescribed. He has follow up arranged in 3 weeks for incision check.    Recent Labs    04/16/21 0500  NA 136  K 4.1  CL 107  CO2 23  GLUCOSE 100*  BUN 13  CALCIUM 9.0   Recent Labs    04/16/21 0500  WBC 13.1*  HGB 10.2*  HCT 31.7*  PLT 75*   Recent Labs    04/15/21 0550  INR 1.1     Discharge Instructions     Call MD for:   Complete by: As directed    Trouble swallowing, increased neck swelling or tightness, or difficulty breathing   Call MD for:  difficulty breathing, headache or visual disturbances   Complete by: As directed    Call MD for:  redness, tenderness, or signs of infection (pain, swelling, redness, odor or green/yellow discharge around incision site)    Complete by: As directed    Call MD for:  severe uncontrolled pain   Complete by: As directed    Call MD for:  temperature >100.4   Complete by: As directed    Diet - low sodium heart healthy   Complete by: As directed    Discharge patient   Complete by: As directed    If patient tolerates ambulating without any difficulty he is otherwise stable for discharge   Discharge disposition: 01-Home or Self Care   Discharge patient date: 04/16/2021   Discharge wound care:   Complete by: As directed    Keep left neck incision dry for 48 hours. You may then wash it in shower or with mild soap and water, pat dry   Driving Restrictions   Complete by: As directed    No driving while taking pain medication   Increase activity slowly   Complete by: As directed    Lifting restrictions   Complete by: As directed    No heavy lifting, pushing, pulling > 10 lbs       Discharge Diagnosis:  Asymptomatic carotid artery stenosis without infarction, left [I65.22]  Secondary Diagnosis: Patient Active Problem List   Diagnosis Date Noted   Asymptomatic carotid artery stenosis without infarction, left 04/15/2021   Carotid artery stenosis 04/09/2021   Chronic pain syndrome 03/19/2021   Hard of hearing 03/19/2021   Hypertensive retinopathy of both eyes 11/15/2019   Pseudophakia of both eyes 11/15/2019   Spondylosis  without myelopathy or radiculopathy, lumbar region 10/24/2018   Rheumatoid arthritis involving multiple sites with positive rheumatoid factor (HCC) 10/24/2018   Chronic myofascial pain 10/24/2018   DDD (degenerative disc disease), cervical 10/24/2018   Cervical spondylosis without myelopathy 10/24/2018   Thrombocytopenia (HCC) 06/02/2017   Vitamin D deficiency 02/17/2017   Combined forms of age-related cataract of right eye 08/28/2016   Gastroesophageal reflux disease without esophagitis 12/18/2015   Chronic bilateral low back pain without sciatica 12/18/2015   Benign prostatic  hyperplasia 12/18/2015   Pure hypercholesterolemia 10/17/2015   S/P coronary artery stent placement 12/21/2014   Coronary artery disease involving native coronary artery of native heart without angina pectoris 12/21/2014   Essential (primary) hypertension 10/11/2013   Nerve root pain 08/18/2013   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   aspirin 81 MG EC tablet Take 81 mg by mouth daily.   baclofen 10 MG tablet Commonly known as: LIORESAL Take 10 mg by mouth 2 (two) times daily.   Cholecalciferol 25 MCG (1000 UT) capsule Take 1 capsule (1,000 Units total) by mouth daily.   diclofenac Sodium 1 % Gel Commonly known as: VOLTAREN Apply 1 application topically 4 (four) times daily as needed (pain).   Enbrel SureClick 50 MG/ML injection Generic drug: etanercept Inject 50 mg into the skin every Friday.   folic acid 1 MG tablet Commonly known as: FOLVITE Take 1 mg by mouth daily.   gabapentin 300 MG capsule Commonly known as: NEURONTIN Take 300 mg by mouth 2 (two) times daily.   HYDROcodone-acetaminophen 5-325 MG tablet Commonly known as: Norco Take 1 tablet by mouth every 6 (six) hours as needed for moderate pain.   methotrexate 2.5 MG tablet Commonly known as: RHEUMATREX Take 17.5 mg by mouth every Tuesday.   metoprolol succinate 25 MG 24 hr tablet Commonly known as: TOPROL-XL Take 1 tablet (25 mg total) by mouth daily.   nitroGLYCERIN 0.3 MG SL tablet Commonly known as: NITROSTAT PLACE 1 TABLET UNDER THE TONGUE EVERY 5 MINS AS NEEDED FOR CHEST PAIN   rosuvastatin 40 MG  tablet Commonly known as: CRESTOR Take 1 tablet (40 mg total) by mouth daily.   tamsulosin 0.4 MG Caps capsule Commonly known as: FLOMAX Take 2 capsules (0.8 mg total) by mouth daily.   VISINE OP Place 1 drop into both eyes daily as needed (dry eyes).   vitamin B-12 1000 MCG tablet Commonly known as: CYANOCOBALAMIN Take 1,000 mcg by mouth daily.               Discharge Care Instructions  (From admission, onward)           Start     Ordered   04/16/21 0000  Discharge wound care:       Comments: Keep left neck incision dry for 48 hours. You may then wash it in shower or with mild soap and water, pat dry   04/16/21 1409  Discharge Instructions:   [facility] Discharge Instructions Carotid Endarterectomy (CEA)  Please refer to the following instructions for your post-procedure care. Your surgeon or physician assistant will discuss any changes with you.  Activity  You are encouraged to walk as much as you can. You can slowly return to normal activities but must avoid strenuous activity and heavy lifting until your doctor tell you it's OK. Avoid activities such as vacuuming or swinging a golf club. You can drive after one week if you are comfortable and you are no longer taking prescription pain medications. It is normal to feel tired for serval weeks after your surgery. It is also normal to have difficulty with sleep habits, eating, and bowel movements after surgery. These will go away with time.  Bathing/Showering  You may shower after you come home. Do not soak in a bathtub, hot tub, or swim until the incision heals completely.  Incision Care  Shower every day. Clean your incision with mild soap and water. Pat the area dry with a clean towel. You do not need a bandage unless otherwise instructed. Do not apply any ointments or creams to your incision. You may have skin glue on your incision. Do not peel it off. It will  come off on its own in about one week. Your incision may feel thickened and raised for several weeks after your surgery. This is normal and the skin will soften over time. For Men Only: It's OK to shave around the incision but do not shave the incision itself for 2 weeks. It is common to have numbness under your chin that could last for several months.  Diet  Resume your normal diet. There are no special food restrictions following this procedure. A low fat/low cholesterol diet is recommended for all patients with vascular disease. In order to heal from your surgery, it is CRITICAL to get adequate nutrition. Your body requires vitamins, minerals, and protein. Vegetables are the best source of vitamins and minerals. Vegetables also provide the perfect balance of protein. Processed food has little nutritional value, so try to avoid this.  Medications  Resume taking all of your medications unless your doctor or physician assistant tells you not to.  If your incision is causing pain, you may take over-the- counter pain relievers such as acetaminophen (Tylenol). If you were prescribed a stronger pain medication, please be aware these medications can cause nausea and constipation.  Prevent nausea by taking the medication with a snack or meal. Avoid constipation by drinking plenty of fluids and eating foods with a high amount of fiber, such as fruits, vegetables, and grains. Do not take Tylenol if you are taking prescription pain medications.  Follow Up  Our office will schedule a follow up appointment 2-3 weeks following discharge.  Please call us immediately for any of the following conditions  Increased pain, redness, drainage (pus) from your incision site. Fever of 101 degrees or higher. If you should develop stroke (slurred speech, difficulty swallowing, weakness on one side of your body, loss of vision) you should call 911 and go to the nearest emergency room.  Reduce your risk of vascular  disease:  Stop smoking. If you would like help call [facility] at 1-800-QUIT-NOW ([phone]) or [place] at [phone]. Manage your cholesterol Maintain a desired weight Control your diabetes Keep your blood pressure down  If you have any questions, please call the office at [phone].  Prescriptions given: Hydrocodone- Acetaminophen 5-325 mg  #8 No Refill  Disposition: Home  Patient's condition: is Good  Follow up: 1. Dr. Chestine Spore in 3 weeks.   Corrina Baglia PA-C [facility] [phone]   --- For [facility] Registry use ---   Modified Rankin score at D/C (0-6): 0  IV medication needed for:  1. Hypertension: No 2. Hypotension: No  Post-op Complications: No  1. Post-op CVA or TIA: No  If yes: Event classification (right eye, left eye, right cortical, left cortical, verterobasilar, other): n/a  If yes: Timing of event (intra-op, <6 hrs post-op, >=6 hrs post-op, unknown): n/a  2. CN injury: No  If yes: CN not injuried   3. Myocardial infarction: No  If yes: Dx by (EKG or clinical, Troponin): n/a  4.  CHF: No  5.  Dysrhythmia (new): No  6. Wound infection: No  7. Reperfusion symptoms: No  8. Return to OR: No  If yes: return to OR for (bleeding, neurologic, other CEA incision, other): n/a  Discharge medications: Statin use:  Yes ASA use:  Yes   Beta blocker use:  Yes ACE-Inhibitor use:  No  ARB use:  No CCB use: No P2Y12 Antagonist use: No,  Plavix,  Plasugrel,  Ticlopinine,  Ticagrelor,  Other,  No for medical reason,  Non-compliant,  Not-indicated Anti-coagulant use:  No,  Warfarin,  Rivaroxaban,  Dabigatran,

## 2021-04-16 NOTE — Progress Notes (Signed)
Mobility Specialist: Progress Note   04/16/21 1436  Mobility  Activity Ambulated with assistance in hallway  Level of Assistance Contact guard assist, steadying assist  Assistive Device Front wheel walker  Distance Ambulated (ft) 330 ft  Activity Response Tolerated well  $Mobility charge 1 Mobility   Pre-Mobility: 70 HR, 108/65 BP, 95% SpO2 Post-Mobility: 86 HR, 102/65 BP, 95% SpO2  Received pt in bed having no complaints and agreeable to mobility. Asymptomatic throughout ambulation, seated EOB after walk w/ call bell in reach and all needs met.   [facility] Austin Cox Mobility Specialist Mobility Specialist 4 [place]: [phone] Mobility Specialist 2 [place] and [place]: [phone]

## 2021-04-16 NOTE — Progress Notes (Signed)
[facility] [place]  Subjective  -states his left neck is feeling better.  Remain neurologically intact all night.   Objective 98/68 60 98.2 F (36.8 C) 20 94%  Intake/Output Summary (Last 24 hours) at 04/16/2021 0706 Last data filed at 04/16/2021 0641 Gross per 24 hour  Intake 2208.85 ml  Output 2000 ml  Net 208.85 ml    Left neck incision with soft hematoma Grossly neurologically intact with no deficits Pupils are abnormal with a left pupil larger but this is related to previous retina surgery and was present prior to admission  Laboratory Lab Results: Recent Labs    04/16/21 0500  WBC 13.1*  HGB 10.2*  HCT 31.7*  PLT 75*   BMET Recent Labs    04/16/21 0500  NA 136  K 4.1  CL 107  CO2 23  GLUCOSE 100*  BUN 13  CREATININE 1.00  CALCIUM 9.0    COAG Lab Results  Component Value Date   INR 1.1 04/15/2021   No results found for: PTT  Assessment/Planning:  Postop day 1 status post left carotid endarterectomy for high-grade stenosis with ulceration.  He has developed hematoma in his left neck postop but this is very soft.  Remains neurologically intact and at his baseline.  Discussed I would be comfortable observing him one more day.  He is anxious to go home today.  Discussed we will see how he does with breakfast and lunch and ambulating throughout the day and will check on him again later today.  Aspirin statin at discharge.  I do not think his neck needs a washout at this time and is very soft and should improve with conservative care unless his exam changes.  Marty Heck 04/16/2021 7:06 AM --

## 2021-04-16 NOTE — Progress Notes (Signed)
Patient given discharge instructions. Wife present. PIV removed. Telemetry box removed, CCMD notified. Patient taken by wheelchair to vehicle by staff.  Kenard Gower, RN

## 2021-04-23 ENCOUNTER — Encounter: Payer: Medicare Other | Admitting: Vascular Surgery

## 2021-04-25 ENCOUNTER — Telehealth: Payer: Self-pay | Admitting: *Deleted

## 2021-04-25 NOTE — Telephone Encounter (Signed)
Patient's wife called stating patient has a significant rash on the "folds" of his leg and foot.  She was requesting a Rx.  Patient's PCP scheduled and appointment 04/26/2021 however wife was unsure if she could get him there.  She will keep the appointment with PCP and inform us if he needs anything from Dr Chestine Spore.  Wife states that incision looks "good" from the carotid.

## 2021-04-26 ENCOUNTER — Ambulatory Visit: Payer: Medicare Other | Admitting: Family Medicine

## 2021-05-01 ENCOUNTER — Telehealth: Payer: Self-pay

## 2021-05-01 NOTE — Telephone Encounter (Signed)
° °  Telephone encounter was:  Successful.  05/01/2021 Name: Austin Cox MRN: [id number] DOB: [birth year]  Austin Cox is a 76 y.o. year old male who is a primary care patient of Rakes, Connye Burkitt, FNP . The community resource team was consulted for assistance with Food Insecurity, Financial Difficulties related to utility bills, and medical equipment  Care guide performed the following interventions:  Spoke to spouse. Plans made: call insurance for hearing aids and look for other resources, submit meals on wheels application, check two other food pantries as their programs were on hold, and check for utilites programs and send phone assistance applications.  Follow Up Plan:  Care guide will outreach resources to assist patient with all his needs.  [facility] management  [place], [place]  Main Phone: [phone]   E-mail: [email or web address]  Website: [email or web address]

## 2021-05-07 ENCOUNTER — Ambulatory Visit (INDEPENDENT_AMBULATORY_CARE_PROVIDER_SITE_OTHER): Payer: Medicare Other | Admitting: Vascular Surgery

## 2021-05-07 ENCOUNTER — Other Ambulatory Visit: Payer: Self-pay

## 2021-05-07 ENCOUNTER — Encounter: Payer: Self-pay | Admitting: Vascular Surgery

## 2021-05-07 VITALS — BP 117/74 | HR 81 | Temp 98.4°F | Resp 18 | Ht 71.5 in | Wt 205.0 lb

## 2021-05-07 DIAGNOSIS — I6522 Occlusion and stenosis of left carotid artery: Secondary | ICD-10-CM

## 2021-05-07 NOTE — Progress Notes (Signed)
Patient name: Austin Cox MRN: [id number] DOB: 1945/10/09 Sex: male  REASON FOR VISIT: Postop check after left carotid endarterectomy  HPI: Austin Cox is a 76 y.o. male with history of carotid stenosis and rheumatoid arthritis that presents for postop check after recent left carotid endarterectomy.  He had a left carotid endarterectomy on 04/15/2021 for an asymptomatic high-grade stenosis with string sign.  Postop course was complicated by a hematoma that was managed with observation.  He reports the hematoma in his neck has gotten a lot better.  Has remained neurologically intact with no focal TIA or stroke symptoms.  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 No pertinent family history.  SOCIAL HISTORY: Social History   Tobacco Use   Smoking status: Former    Packs/day: 0.20    Years: 10.00    Pack years: 2.00    Types: Cigarettes    Quit date: 07/06/1974    Years since quitting: 46.8   Smokeless tobacco: Never  Substance Use Topics   Alcohol use: Not Currently    No Known Allergies  Current Outpatient Medications  Medication Sig Dispense Refill   acetaminophen (TYLENOL) 650 MG CR tablet Take 650 mg by mouth every 8 (eight) hours as needed for pain.     aspirin 81 MG EC tablet Take 81 mg by mouth daily.     baclofen (LIORESAL) 10 MG tablet Take 10 mg by mouth 2 (two) times daily.     Cholecalciferol 25 MCG (1000 UT) capsule Take 1  capsule (1,000 Units total) by mouth daily. 90 capsule 2   diclofenac Sodium (VOLTAREN) 1 % GEL Apply 1 application topically 4 (four) times daily as needed (pain).     etanercept (ENBREL SURECLICK) 50 MG/ML injection Inject 50 mg into the skin every Friday.     folic acid (FOLVITE) 1 MG tablet Take 1 mg by mouth daily.     gabapentin (NEURONTIN) 300 MG capsule Take 300 mg by mouth 2 (two) times daily.     methotrexate (RHEUMATREX) 2.5 MG tablet Take 17.5 mg by mouth every Tuesday.     metoprolol succinate (TOPROL-XL) 25 MG 24 hr tablet Take 1 tablet (25 mg total) by mouth daily. 90 tablet 2   nitroGLYCERIN (NITROSTAT) 0.3 MG SL tablet PLACE 1 TABLET UNDER THE TONGUE EVERY 5 MINS AS NEEDED FOR CHEST PAIN     rosuvastatin (CRESTOR) 40 MG tablet Take 1 tablet (40 mg total) by mouth daily. 90 tablet 2   tamsulosin (FLOMAX) 0.4 MG CAPS capsule Take 2 capsules (0.8 mg total) by mouth daily. 30 capsule 6   Tetrahydrozoline HCl (VISINE OP) Place 1 drop into both eyes daily as needed (dry eyes).     vitamin B-12 (CYANOCOBALAMIN) 1000 MCG tablet Take 1,000 mcg by mouth daily.     HYDROcodone-acetaminophen ([place])  5-325 MG tablet Take 1 tablet by mouth every 6 (six) hours as needed for moderate pain. (Patient not taking: Reported on 05/07/2021) 8 tablet 0   No current facility-administered medications for this visit.    REVIEW OF SYSTEMS:  [X]  denotes positive finding, [ ]  denotes negative finding Cardiac  Comments:  Chest pain or chest pressure:    Shortness of breath upon exertion:    Short of breath when lying flat:    Irregular heart rhythm:        Vascular    Pain in calf, thigh, or hip brought on by ambulation:    Pain in feet at night that wakes you up from your sleep:     Blood clot in your veins:    Leg swelling:         Pulmonary    Oxygen at home:    Productive cough:     Wheezing:         Neurologic    Sudden weakness in arms or legs:     Sudden numbness in arms or legs:      Sudden onset of difficulty speaking or slurred speech:    Temporary loss of vision in one eye:     Problems with dizziness:         Gastrointestinal    Blood in stool:     Vomited blood:         Genitourinary    Burning when urinating:     Blood in urine:        Psychiatric    Major depression:         Hematologic    Bleeding problems:    Problems with blood clotting too easily:        Skin    Rashes or ulcers:        Constitutional    Fever or chills:      PHYSICAL EXAM: Vitals:   05/07/21 1351 05/07/21 1354  BP: 113/73 117/74  Pulse: 79 81  Resp: 18   Temp: 98.4 F (36.9 C)   TempSrc: Temporal   SpO2: 94%   Weight: 205 lb (93 kg)   Height: 5' 11.5" (1.816 m)     GENERAL: The patient is a well-nourished male, in no acute distress. The vital signs are documented above. CARDIAC: There is a regular rate and rhythm.  VASCULAR:  Left neck with fullness consistent with hematoma that is improving since last evaluation PULMONARY: No respiratory distress. ABDOMEN: Soft and non-tender. MUSCULOSKELETAL: There are no major deformities or cyanosis. NEUROLOGIC: No focal weakness or paresthesias are detected.  Cranial nerves II through XII grossly intact SKIN: There are no ulcers or rashes noted. PSYCHIATRIC: The patient has a normal affect.  DATA:   N/A  Assessment/Plan:  76 year old male that underwent left carotid endarterectomy for asymptomatic high-grade stenosis with string sign on 04/15/2021.  Overall he looks good today and the hematoma in his left neck is improving and much softer since he was discharged.  He is neurologically intact and really has no complaints.  Discussed I will see him in 9 months with carotid duplex for ongoing surveillance.  Continue 81 mg aspirin from my standpoint.   Marty Heck, MD [facility] [place] Office: [phone]

## 2021-05-09 DIAGNOSIS — Z79899 Other long term (current) drug therapy: Secondary | ICD-10-CM | POA: Diagnosis not present

## 2021-05-09 DIAGNOSIS — M0579 Rheumatoid arthritis with rheumatoid factor of multiple sites without organ or systems involvement: Secondary | ICD-10-CM | POA: Diagnosis not present

## 2021-05-14 ENCOUNTER — Other Ambulatory Visit: Payer: Self-pay | Admitting: *Deleted

## 2021-05-14 ENCOUNTER — Telehealth: Payer: Self-pay

## 2021-05-14 DIAGNOSIS — I6522 Occlusion and stenosis of left carotid artery: Secondary | ICD-10-CM

## 2021-05-14 NOTE — Telephone Encounter (Signed)
° °  Telephone encounter was:  Unsuccessful.  05/14/2021 Name: Austin Cox MRN: [id number] DOB: [birth year]  Unsuccessful outbound call made today to assist with:  Food Insecurity, Financial Difficulties related to bills, and medical equipment  Outreach Attempt:  1st Attempt  Outreach call to provide update of referral status. Pt does not have a voicemail set up, therefore, I was unable to reach pt.  [facility] Guide, Embedded Care Coordination [facility]  [place], [place] [address]  Main Phone: [phone]   E-mail: [email or web address]  Website: [email or web address]

## 2021-05-15 ENCOUNTER — Telehealth: Payer: Self-pay

## 2021-05-15 NOTE — Telephone Encounter (Addendum)
? ?  Telephone encounter was:  Successful.  ?05/15/2021 ?Name: Austin Cox MRN: [id number] DOB: 07-29-1945 ? ?Austin Cox is a 76 y.o. year old male who is a primary care patient of Sonny Masters, FNP . The community resource team was consulted for assistance with Financial Difficulties related to bills and medical equipment ? ?Care guide performed the following interventions:  Spouse called back in and we were unable to hear each other. I did a hard restart and contacted spouse back. I advised spouse meals on wheels application was completed, provided new bank ([employer]), told her prices for hearing aids which insurance, etc. ? ?Plans made on 2/15-which all have been completed:  ? ?-call insurance for hearing aids and look for other resources (completed and prices provided) ? ?-submit meals on wheels application (submitted 3/1 as rep just returned call) ? ?-check two other food pantries as their programs were on hold (only 1 available-[employer] food bank information sent as well.) ? ?-check for [employer] and send phone assistance applications (completed and adding [place] financial assistance program) ? ?Enclosed mail: [employer] dancing Advertising copywriter, Actuary, [place] Merchant navy officer, [place] resources for support with finances, Medical Debt relief such as Emergency planning/management officer, [place] [employer] of hearing pamphlet, financial assistance resources for hearing aids, ACP application for assistance with phone and Internet bills, and [employer] financial assistance program. ? ?Follow Up Plan:  Care guide will follow up with patient by phone over the next few days to ensure mail has been received. ? ?[name] ?Care Guide, Embedded Care Coordination ?[facility]  Care management  ?[place], [place] [address]  ?Main Phone: [phone]  E-mail: [email or web address]  ?Website: [email or web address] ? ? ? ?

## 2021-05-15 NOTE — Telephone Encounter (Signed)
? ?  Telephone encounter was:  Unsuccessful.  05/15/2021 ?Name: Austin Cox MRN: [id number] DOB: 03-Jun-1945 ? ?Unsuccessful outbound call made today to assist with:  Financial Difficulties related to bills and medical equipment ? ?Outreach Attempt:  2nd Attempt ? ?Outreach call to provide update of referral status. Pt does not have a voicemail set up, therefore, I was unable to reach pt. ? ?Austin Cox ?Care Guide, Embedded Care Coordination ?[facility]  Care management  ?[place], [place] [address]  ?Main Phone: [phone]  E-mail: [email or web address]  ?Website: [email or web address] ? ? ? ?

## 2021-05-16 ENCOUNTER — Ambulatory Visit (INDEPENDENT_AMBULATORY_CARE_PROVIDER_SITE_OTHER): Payer: Medicare Other | Admitting: Family Medicine

## 2021-05-16 ENCOUNTER — Encounter: Payer: Self-pay | Admitting: Family Medicine

## 2021-05-16 ENCOUNTER — Other Ambulatory Visit: Payer: Medicare Other

## 2021-05-16 DIAGNOSIS — R062 Wheezing: Secondary | ICD-10-CM | POA: Diagnosis not present

## 2021-05-16 DIAGNOSIS — M0579 Rheumatoid arthritis with rheumatoid factor of multiple sites without organ or systems involvement: Secondary | ICD-10-CM | POA: Diagnosis not present

## 2021-05-16 DIAGNOSIS — J069 Acute upper respiratory infection, unspecified: Secondary | ICD-10-CM

## 2021-05-16 DIAGNOSIS — Z79899 Other long term (current) drug therapy: Secondary | ICD-10-CM | POA: Diagnosis not present

## 2021-05-16 MED ORDER — PREDNISONE 20 MG PO TABS: ORAL_TABLET | ORAL | 0 refills | Status: DC

## 2021-05-16 NOTE — Progress Notes (Signed)
Virtual Visit via telephone Note Due to COVID-19 pandemic this visit was conducted virtually. This visit type was conducted due to national recommendations for restrictions regarding the COVID-19 Pandemic (e.g. social distancing, sheltering in place) in an effort to limit this patient's exposure and mitigate transmission in our community. All issues noted in this document were discussed and addressed.  A physical exam was not performed with this format.   I connected with Austin Cox on 05/16/2021 at 0900 by telephone and verified that I am speaking with the correct person using two identifiers. Austin Cox is currently located at home and family is currently with them during visit. The provider, Monia Pouch, FNP is located in their office at time of visit.  I discussed the limitations, risks, security and privacy concerns of performing an evaluation and management service by virtual visit and the availability of in person appointments. I also discussed with the patient that there may be a patient responsible charge related to this service. The patient expressed understanding and agreed to proceed.  Subjective:  Patient ID: Austin Cox, male    DOB: 03-30-1945, 76 y.o.   MRN: [id number]  Chief Complaint:  Cough and URI   HPI: Austin Cox is a 76 y.o. male presenting on 05/16/2021 for Cough and URI   Cough This is a new problem. The current episode started in the past 7 days. The problem has been waxing and waning. The problem occurs constantly. The cough is Productive of sputum. Associated symptoms include nasal congestion, postnasal drip, rhinorrhea and wheezing. Pertinent negatives include no chest pain, chills, ear congestion, ear pain, fever, headaches, heartburn, hemoptysis, myalgias, rash, sore throat, shortness of breath, sweats or weight loss. He has tried nothing for the symptoms. The treatment provided no relief.  URI  This is a new problem. The current episode started  in the past 7 days. The problem has been waxing and waning. Associated symptoms include congestion, coughing, rhinorrhea, sinus pain and wheezing. Pertinent negatives include no abdominal pain, chest pain, diarrhea, dysuria, ear pain, headaches, joint pain, joint swelling, nausea, neck pain, plugged ear sensation, rash, sneezing, sore throat, swollen glands or vomiting. He has tried nothing for the symptoms.    Relevant past medical, surgical, family, and social history reviewed and updated as indicated.  Allergies and medications reviewed and updated.   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    Past Surgical History:  Procedure Laterality Date   CATARACT EXTRACTION     ENDARTERECTOMY Left 04/15/2021   Procedure: LEFT CAROTID ENDARTERECTOMY WITH;  Surgeon: Marty Heck, MD;  [facility]: [facility];  Service: Vascular;  Laterality: Left;   EYE SURGERY     detached retina right eye   FRACTURE SURGERY     right leg   Stent in heart      Social History   Socioeconomic History   Marital status: Married    Spouse name: Not on file   Number of children: 3   Years of education: Not on file   Highest education level: Not on file  Occupational History   Occupation: retired  Tobacco Use   Smoking status: Former    Packs/day: 0.20    Years: 10.00    Pack years: 2.00  Types: Cigarettes    Quit date: 07/06/1974    Years since quitting: 46.8   Smokeless tobacco: Never  Vaping Use   Vaping Use: Never used  Substance and Sexual Activity   Alcohol use: Not Currently   Drug use: Never   Sexual activity: Not on file  Other Topics Concern   Not on file  Social History Narrative   3 children - one passed away   Son in [place]   They raised 2 teenage grandchildren after their daughter and son in law both passed away     Social Determinants of Health   Financial Resource Strain: Medium Risk   Difficulty of Paying Living Expenses: Somewhat hard  Food Insecurity: Landscape architect Present   Worried About Charity fundraiser in the Last Year: Often true   Arboriculturist in the Last Year: Sometimes true  Transportation Needs: No Transportation Needs   Lack of Transportation (Medical): No   Lack of Transportation (Non-Medical): No  Physical Activity: Insufficiently Active   Days of Exercise per Week: 6 days   Minutes of Exercise per Session: 20 min  Stress: No Stress Concern Present   Feeling of Stress : Not at all  Social Connections: Moderately Isolated   Frequency of Communication with Friends and Family: More than three times a week   Frequency of Social Gatherings with Friends and Family: Once a week   Attends Religious Services: Never   Marine scientist or Organizations: No   Attends Music therapist: Never   Marital Status: Married  Human resources officer Violence: Not At Risk   Fear of Current or Ex-Partner: No   Emotionally Abused: No   Physically Abused: No   Sexually Abused: No    Outpatient Encounter Medications as of 05/16/2021  Medication Sig   predniSONE (DELTASONE) 20 MG tablet 2 po at sametime daily for 5 days- start tomorrow   acetaminophen (TYLENOL) 650 MG CR tablet Take 650 mg by mouth every 8 (eight) hours as needed for pain.   aspirin 81 MG EC tablet Take 81 mg by mouth daily.   baclofen (LIORESAL) 10 MG tablet Take 10 mg by mouth 2 (two) times daily.   Cholecalciferol 25 MCG (1000 UT) capsule Take 1 capsule (1,000 Units total) by mouth daily.   diclofenac Sodium (VOLTAREN) 1 % GEL Apply 1 application topically 4 (four) times daily as needed (pain).   etanercept (ENBREL SURECLICK) 50 MG/ML injection Inject 50 mg into the skin every Friday.   folic acid (FOLVITE) 1 MG tablet Take 1 mg by mouth daily.   gabapentin (NEURONTIN) 300 MG capsule Take 300 mg by mouth 2  (two) times daily.   HYDROcodone-acetaminophen (NORCO) 5-325 MG tablet Take 1 tablet by mouth every 6 (six) hours as needed for moderate pain. (Patient not taking: Reported on 05/07/2021)   methotrexate (RHEUMATREX) 2.5 MG tablet Take 17.5 mg by mouth every Tuesday.   metoprolol succinate (TOPROL-XL) 25 MG 24 hr tablet Take 1 tablet (25 mg total) by mouth daily.   nitroGLYCERIN (NITROSTAT) 0.3 MG SL tablet PLACE 1 TABLET UNDER THE TONGUE EVERY 5 MINS AS NEEDED FOR CHEST PAIN   rosuvastatin (CRESTOR) 40 MG tablet Take 1 tablet (40 mg total) by mouth daily.   tamsulosin (FLOMAX) 0.4 MG CAPS capsule Take 2 capsules (0.8 mg total) by mouth daily.   Tetrahydrozoline HCl (VISINE OP) Place 1 drop into both eyes daily as needed (dry eyes).   vitamin B-12 (CYANOCOBALAMIN) 1000  MCG tablet Take 1,000 mcg by mouth daily.   No facility-administered encounter medications on file as of 05/16/2021.    No Known Allergies  Review of Systems  Constitutional:  Negative for activity change, appetite change, chills, diaphoresis, fatigue, fever, unexpected weight change and weight loss.  HENT:  Positive for congestion, postnasal drip, rhinorrhea and sinus pain. Negative for dental problem, drooling, ear discharge, ear pain, facial swelling, hearing loss, mouth sores, nosebleeds, sneezing and sore throat.   Respiratory:  Positive for cough and wheezing. Negative for hemoptysis, choking, chest tightness, shortness of breath and stridor.   Cardiovascular:  Negative for chest pain, palpitations and leg swelling.  Gastrointestinal:  Negative for abdominal pain, diarrhea, heartburn, nausea and vomiting.  Genitourinary:  Negative for decreased urine volume, difficulty urinating and dysuria.  Musculoskeletal:  Negative for joint pain, myalgias and neck pain.  Skin:  Negative for rash.  Neurological:  Negative for dizziness, tremors, seizures, syncope, facial asymmetry, speech difficulty, weakness, light-headedness, numbness  and headaches.  Hematological:  Negative for adenopathy. Does not bruise/bleed easily.  Psychiatric/Behavioral:  Negative for confusion.   All other systems reviewed and are negative.       Observations/Objective: No vital signs or physical exam, this was a virtual health encounter.  Pt alert and oriented, answers all questions appropriately, and able to speak in full sentences.    Assessment and Plan: Khalique was seen today for cough and uri.  Diagnoses and all orders for this visit:  URI with cough and congestion Wheezing  Testing pending. No indications of acute bacterial infection. Will burst with steroids due to wheezing. Pt aware to start Mucinex with lots of water and Flonase. Further treatment pending results. Report any new, worsening, or persistent symptoms.  -     COVID-19, Flu A+B and RSV -     predniSONE (DELTASONE) 20 MG tablet; 2 po at sametime daily for 5 days- start tomorrow    Follow Up Instructions: Return if symptoms worsen or fail to improve.    I discussed the assessment and treatment plan with the patient. The patient was provided an opportunity to ask questions and all were answered. The patient agreed with the plan and demonstrated an understanding of the instructions.   The patient was advised to call back or seek an in-person evaluation if the symptoms worsen or if the condition fails to improve as anticipated.  The above assessment and management plan was discussed with the patient. The patient verbalized understanding of and has agreed to the management plan. Patient is aware to call the clinic if they develop any new symptoms or if symptoms persist or worsen. Patient is aware when to return to the clinic for a follow-up visit. Patient educated on when it is appropriate to go to the emergency department.    I provided 15 minutes of time during this telephone encounter.   Monia Pouch, FNP-C [facility] [address] [place], [place] [address] [phone] 05/16/2021

## 2021-05-17 LAB — COVID-19, FLU A+B AND RSV
Influenza A, NAA: NOT DETECTED
Influenza B, NAA: NOT DETECTED
RSV, NAA: NOT DETECTED
SARS-CoV-2, NAA: NOT DETECTED

## 2021-05-20 ENCOUNTER — Ambulatory Visit (INDEPENDENT_AMBULATORY_CARE_PROVIDER_SITE_OTHER): Payer: Medicare Other | Admitting: *Deleted

## 2021-05-20 DIAGNOSIS — Z23 Encounter for immunization: Secondary | ICD-10-CM

## 2021-05-20 MED ORDER — CYANOCOBALAMIN 1000 MCG/ML IJ SOLN: 1000.0000 ug | Freq: Once | INTRAMUSCULAR | Status: DC

## 2021-05-23 NOTE — Telephone Encounter (Signed)
Encounter complete. 

## 2021-05-24 ENCOUNTER — Telehealth: Payer: Self-pay

## 2021-05-24 ENCOUNTER — Ambulatory Visit (INDEPENDENT_AMBULATORY_CARE_PROVIDER_SITE_OTHER): Payer: Medicare Other | Admitting: Family Medicine

## 2021-05-24 ENCOUNTER — Encounter: Payer: Self-pay | Admitting: Family Medicine

## 2021-05-24 VITALS — BP 116/74 | HR 91 | Temp 97.4°F | Resp 20 | Ht 71.5 in | Wt 211.0 lb

## 2021-05-24 DIAGNOSIS — N3090 Cystitis, unspecified without hematuria: Secondary | ICD-10-CM | POA: Diagnosis not present

## 2021-05-24 DIAGNOSIS — J069 Acute upper respiratory infection, unspecified: Secondary | ICD-10-CM | POA: Diagnosis not present

## 2021-05-24 DIAGNOSIS — R39198 Other difficulties with micturition: Secondary | ICD-10-CM

## 2021-05-24 DIAGNOSIS — M0579 Rheumatoid arthritis with rheumatoid factor of multiple sites without organ or systems involvement: Secondary | ICD-10-CM

## 2021-05-24 LAB — URINALYSIS, COMPLETE
Bilirubin, UA: NEGATIVE
Glucose, UA: NEGATIVE
Ketones, UA: NEGATIVE
Leukocytes,UA: NEGATIVE
Nitrite, UA: NEGATIVE
Specific Gravity, UA: 1.01 (ref 1.005–1.030)
Urobilinogen, Ur: 4 mg/dL — ABNORMAL HIGH (ref 0.2–1.0)
pH, UA: 7 (ref 5.0–7.5)

## 2021-05-24 LAB — MICROSCOPIC EXAMINATION
Renal Epithel, UA: NONE SEEN /hpf
WBC, UA: NONE SEEN /hpf (ref 0–5)

## 2021-05-24 MED ORDER — CIPROFLOXACIN HCL 500 MG PO TABS
500.0000 mg | ORAL_TABLET | Freq: Two times a day (BID) | ORAL | 0 refills | Status: AC
Start: 2021-05-24 — End: 2021-05-29

## 2021-05-24 MED ORDER — GUAIFENESIN ER 600 MG PO TB12: 600.0000 mg | ORAL_TABLET | Freq: Two times a day (BID) | ORAL | 0 refills | Status: AC

## 2021-05-24 NOTE — Telephone Encounter (Signed)
? ?  Telephone encounter was:  Unsuccessful.  05/24/2021 ?Name: Austin Cox MRN: [id number] DOB: June 02, 1945 ? ?Unsuccessful outbound call made today to assist with:  Financial Difficulties related to bills and medical equipment ? ?Outreach Attempt:  1st Attempt ? ?Unable to leave voicemail as mailbox has not been set up. ? ?[name] ?Care Guide, Embedded Care Coordination ?[facility]  Care management  ?[place], [place] [address]  ?Main Phone: [phone]  E-mail: [email or web address]  ?Website: [email or web address] ? ? ?

## 2021-05-24 NOTE — Addendum Note (Signed)
Addended by: Sonny Masters on: 05/24/2021 04:27 PM ? ? Modules accepted: Orders ? ?

## 2021-05-24 NOTE — Progress Notes (Signed)
Subjective:  Patient ID: Austin Cox, male    DOB: [birth year], 75 y.o.   MRN: [id number]  Patient Care Team: Sonny Masters, FNP as PCP - General (Family Medicine)   Chief Complaint:  Urinary Tract Infection and Chest congestion   HPI: Austin Cox is a 76 y.o. male presenting on 05/24/2021 for Urinary Tract Infection and Chest congestion   Pt presents today with complaints of urinary hesitancy, frequency, dysuria, and nocturia. Worsening over the last few days. No associated fever, chills, back pain, weakness, or confusion. He has been seen by urology in the past and was placed on flomax 0.8 mg daily, has only been taking 0.4 mg daily.  He also reports ongoing sputum production post URI. States he has clear sputum production. No other associated symptoms. Was recently treated with prednisone and improved greatly but still has the sputum. Has not been taking Mucinex twice daily as discussed.   Urinary Tract Infection  This is a new problem. The current episode started in the past 7 days. The problem has been gradually worsening. The quality of the pain is described as burning and aching. The pain is at a severity of 2/10. There has been no fever. Associated symptoms include frequency, hesitancy and urgency. Pertinent negatives include no chills, discharge, flank pain, hematuria, nausea, possible pregnancy, sweats or vomiting. He has tried nothing for the symptoms.    Relevant past medical, surgical, family, and social history reviewed and updated as indicated.  Allergies and medications reviewed and updated. Data reviewed: Chart in Epic.   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    Past Surgical History:  Procedure Laterality Date   CATARACT EXTRACTION     ENDARTERECTOMY Left 04/15/2021   Procedure: LEFT  CAROTID ENDARTERECTOMY WITH;  Surgeon: Cephus Shelling, MD;  Location: [facility] OR;  Service: Vascular;  Laterality: Left;   EYE SURGERY     detached retina right eye   FRACTURE SURGERY     right leg   Stent in heart      Social History   Socioeconomic History   Marital status: Married    Spouse name: Not on file   Number of children: 3   Years of education: Not on file   Highest education level: Not on file  Occupational History   Occupation: retired  Tobacco Use   Smoking status: Former    Packs/day: 0.20    Years: 10.00    Pack years: 2.00    Types: Cigarettes    Quit date: 07/06/1974    Years since quitting: 46.9   Smokeless tobacco: Never  Vaping Use   Vaping Use: Never used  Substance and Sexual Activity   Alcohol use: Not Currently   Drug use: Never   Sexual activity: Not on file  Other Topics Concern   Not on file  Social History Narrative   3 children - one passed away   Son in [place]   They raised 2 teenage grandchildren after their daughter and son in law both passed away    Social Determinants of Health   Financial Resource Strain: Medium Risk   Difficulty of Paying Living Expenses: Somewhat hard  Food Insecurity: Food Insecurity Present   Worried About Programme researcher, broadcasting/film/video in  the Last Year: Often true   Ran Out of Food in the Last Year: Sometimes true  Transportation Needs: No Transportation Needs   Lack of Transportation (Medical): No   Lack of Transportation (Non-Medical): No  Physical Activity: Insufficiently Active   Days of Exercise per Week: 6 days   Minutes of Exercise per Session: 20 min  Stress: No Stress Concern Present   Feeling of Stress : Not at all  Social Connections: Moderately Isolated   Frequency of Communication with Friends and Family: More than three times a week   Frequency of Social Gatherings with Friends and Family: Once a week   Attends Religious Services: Never   Database administratorActive Member of Clubs or Organizations: No   Attends  Engineer, structuralClub or Organization Meetings: Never   Marital Status: Married  Catering managerntimate Partner Violence: Not At Risk   Fear of Current or Ex-Partner: No   Emotionally Abused: No   Physically Abused: No   Sexually Abused: No    Outpatient Encounter Medications as of 05/24/2021  Medication Sig   acetaminophen (TYLENOL) 650 MG CR tablet Take 650 mg by mouth every 8 (eight) hours as needed for pain.   aspirin 81 MG EC tablet Take 81 mg by mouth daily.   baclofen (LIORESAL) 10 MG tablet Take 10 mg by mouth 2 (two) times daily.   Cholecalciferol 25 MCG (1000 UT) capsule Take 1 capsule (1,000 Units total) by mouth daily.   etanercept (ENBREL SURECLICK) 50 MG/ML injection Inject 50 mg into the skin every Friday.   folic acid (FOLVITE) 1 MG tablet Take 1 mg by mouth daily.   gabapentin (NEURONTIN) 300 MG capsule Take 300 mg by mouth 2 (two) times daily.   guaiFENesin (MUCINEX) 600 MG 12 hr tablet Take 1 tablet (600 mg total) by mouth 2 (two) times daily for 10 days.   methotrexate (RHEUMATREX) 2.5 MG tablet Take 17.5 mg by mouth every Tuesday.   metoprolol succinate (TOPROL-XL) 25 MG 24 hr tablet Take 1 tablet (25 mg total) by mouth daily.   nitroGLYCERIN (NITROSTAT) 0.3 MG SL tablet PLACE 1 TABLET UNDER THE TONGUE EVERY 5 MINS AS NEEDED FOR CHEST PAIN   rosuvastatin (CRESTOR) 40 MG tablet Take 1 tablet (40 mg total) by mouth daily.   tamsulosin (FLOMAX) 0.4 MG CAPS capsule Take 2 capsules (0.8 mg total) by mouth daily.   Tetrahydrozoline HCl (VISINE OP) Place 1 drop into both eyes daily as needed (dry eyes).   diclofenac Sodium (VOLTAREN) 1 % GEL Apply 1 application topically 4 (four) times daily as needed (pain). (Patient not taking: Reported on 05/24/2021)   HYDROcodone-acetaminophen (NORCO) 5-325 MG tablet Take 1 tablet by mouth every 6 (six) hours as needed for moderate pain. (Patient not taking: Reported on 05/07/2021)   vitamin B-12 (CYANOCOBALAMIN) 1000 MCG tablet Take 1,000 mcg by mouth daily. (Patient not  taking: Reported on 05/24/2021)   [DISCONTINUED] predniSONE (DELTASONE) 20 MG tablet 2 po at sametime daily for 5 days- start tomorrow   No facility-administered encounter medications on file as of 05/24/2021.    No Known Allergies  Review of Systems  Constitutional:  Negative for activity change, appetite change, chills, diaphoresis, fatigue, fever and unexpected weight change.  HENT:  Positive for congestion.   Respiratory:  Negative for apnea, cough, choking, chest tightness, shortness of breath, wheezing and stridor.   Cardiovascular:  Negative for chest pain, palpitations and leg swelling.  Gastrointestinal:  Negative for abdominal pain, constipation, diarrhea, nausea and vomiting.  Genitourinary:  Positive for difficulty urinating, dysuria, frequency, hesitancy and urgency. Negative for decreased urine volume, enuresis, flank pain, genital sores, hematuria, penile discharge, penile pain, penile swelling, scrotal swelling and testicular pain.       Nocturia  Neurological:  Negative for weakness and headaches.  Psychiatric/Behavioral:  Negative for confusion.   All other systems reviewed and are negative.      Objective:  BP 116/74    Pulse 91    Temp (!) 97.4 F (36.3 C) (Oral)    Resp 20    Ht 5' 11.5" (1.816 m)    Wt 211 lb (95.7 kg)    SpO2 97%    BMI 29.02 kg/m    Wt Readings from Last 3 Encounters:  05/24/21 211 lb (95.7 kg)  05/07/21 205 lb (93 kg)  04/15/21 216 lb 4.8 oz (98.1 kg)    Physical Exam Vitals and nursing note reviewed.  Constitutional:      General: He is not in acute distress.    Appearance: Normal appearance. He is not ill-appearing, toxic-appearing or diaphoretic.  HENT:     Head: Normocephalic and atraumatic.     Right Ear: Decreased hearing noted.     Left Ear: Decreased hearing noted.     Mouth/Throat:     Mouth: Mucous membranes are moist.  Eyes:     Conjunctiva/sclera: Conjunctivae normal.     Comments: Right pupil 2 mm, left pupil 4 mm  (normal per pt)   Cardiovascular:     Rate and Rhythm: Normal rate and regular rhythm.     Heart sounds: Normal heart sounds.  Pulmonary:     Effort: Pulmonary effort is normal.     Breath sounds: Normal breath sounds. No wheezing or rhonchi.  Abdominal:     General: Bowel sounds are normal.     Palpations: Abdomen is soft.     Tenderness: There is no right CVA tenderness or left CVA tenderness.  Genitourinary:    Comments: Declined Musculoskeletal:     Right lower leg: No edema.     Left lower leg: No edema.  Skin:    General: Skin is warm and dry.     Capillary Refill: Capillary refill takes less than 2 seconds.  Neurological:     General: No focal deficit present.     Mental Status: He is alert and oriented to person, place, and time.     Gait: Gait abnormal (short steps, forward leaning, using walking stick).  Psychiatric:        Mood and Affect: Mood normal.        Behavior: Behavior normal.        Thought Content: Thought content normal.        Judgment: Judgment normal.    Results for orders placed or performed in visit on 05/16/21  COVID-19, Flu A+B and RSV   Specimen: Nasopharyngeal(NP) swabs in vial transport medium  Result Value Ref Range   SARS-CoV-2, NAA Not Detected Not Detected   Influenza A, NAA Not Detected Not Detected   Influenza B, NAA Not Detected Not Detected   RSV, NAA Not Detected Not Detected   Test Information: Comment        Pertinent labs & imaging results that were available during my care of the patient were reviewed by me and considered in my medical decision making.  Assessment & Plan:  Shiheem was seen today for urinary tract infection and chest congestion.  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 Pt unable to provide urine in office today. Will take specimen cup and bring sample when able. Pt aware to take flomax as prescribed and follow up with urology. Will check CBC and PSA today. Further  treatment pending results. No indications of systemic infection.  -     Urinalysis, Complete -     Urine Culture -     PSA, total and free -     CBC with Differential/Platelet  URI with cough and congestion Greatly improved but continues to cough up clear phlegm. Will add Mucinex twice daily with lots of water. Pt and wife aware to report any new, worsening, or persistent symptoms.  -     guaiFENesin (MUCINEX) 600 MG 12 hr tablet; Take 1 tablet (600 mg total) by mouth 2 (two) times daily for 10 days.  Rheumatoid arthritis involving multiple sites with positive rheumatoid factor (HCC) Does not wish to continue driving to [facility] to see rheum, would like to go to [place]. Will place new referral to [facility] today. -     Ambulatory referral to Rheumatology     Continue all other maintenance medications.  Follow up plan: Return if symptoms worsen or fail to improve.   Continue healthy lifestyle choices, including diet (rich in fruits, vegetables, and lean proteins, and low in salt and simple carbohydrates) and exercise (at least 30 minutes of moderate physical activity daily).   The above assessment and management plan was discussed with the patient. The patient verbalized understanding of and has agreed to the management plan. Patient is aware to call the clinic if they develop any new symptoms or if symptoms persist or worsen. Patient is aware when to return to the clinic for a follow-up visit. Patient educated on when it is appropriate to go to the emergency department.   Kari Baars, FNP-C [facility] [phone]

## 2021-05-25 LAB — CBC WITH DIFFERENTIAL/PLATELET
Basophils Absolute: 0 10*3/uL (ref 0.0–0.2)
Basos: 0 %
EOS (ABSOLUTE): 0.1 10*3/uL (ref 0.0–0.4)
Eos: 1 %
Hematocrit: 37.2 % — ABNORMAL LOW (ref 37.5–51.0)
Hemoglobin: 12.4 g/dL — ABNORMAL LOW (ref 13.0–17.7)
Lymphocytes Absolute: 1.9 10*3/uL (ref 0.7–3.1)
Lymphs: 14 %
MCH: 27.8 pg (ref 26.6–33.0)
MCHC: 33.3 g/dL (ref 31.5–35.7)
MCV: 83 fL (ref 79–97)
Monocytes Absolute: 4 10*3/uL — ABNORMAL HIGH (ref 0.1–0.9)
Monocytes: 30 %
Neutrophils Absolute: 7.2 10*3/uL — ABNORMAL HIGH (ref 1.4–7.0)
Neutrophils: 54 %
Platelets: 85 10*3/uL — CL (ref 150–450)
RBC: 4.46 x10E6/uL (ref 4.14–5.80)
RDW: 16 % — ABNORMAL HIGH (ref 11.6–15.4)
WBC: 13.3 10*3/uL — ABNORMAL HIGH (ref 3.4–10.8)

## 2021-05-25 LAB — PSA, TOTAL AND FREE
PSA, Free Pct: 20.3 %
PSA, Free: 0.81 ng/mL
Prostate Specific Ag, Serum: 4 ng/mL (ref 0.0–4.0)

## 2021-05-25 LAB — IMMATURE CELLS: Metamyelocytes: 1 % — ABNORMAL HIGH (ref 0–0)

## 2021-05-26 LAB — URINE CULTURE: Organism ID, Bacteria: NO GROWTH

## 2021-05-27 ENCOUNTER — Telehealth: Payer: Self-pay

## 2021-05-27 NOTE — Telephone Encounter (Signed)
? ?  Telephone encounter was:  Successful.  ?05/27/2021 ?Name: Austin Cox MRN: [id number] DOB: 30-Oct-1945 ? ?Austin Cox is a 76 y.o. year old male who is a primary care patient of Sonny Masters, FNP . The community resource team was consulted for assistance with Financial Difficulties related to bills and medical equipment ? ?Care guide performed the following interventions:  Spouse called back in and advised mail was received on Saturday. At this time, there are no further questions or concerns. ? ?Follow Up Plan:  No further follow up planned at this time. The patient has been provided with needed resources. ? ?[name] ?Care Guide, Embedded Care Coordination ?[facility]  Care management  ?[place], [place] [address]  ?Main Phone: [phone]  E-mail: [email or web address]  ?Website: [email or web address] ? ? ? ?

## 2021-05-27 NOTE — Telephone Encounter (Signed)
? ?  Telephone encounter was:  Unsuccessful.  05/27/2021 ?Name: Austin Cox MRN: [id number] DOB: 03/06/1946 ? ?Unsuccessful outbound call made today to assist with:  Financial Difficulties related to bills and medical equipment ? ?Outreach Attempt:  2nd Attempt ? ?A HIPAA compliant voice message was left requesting a return call.  Instructed patient to call back at [phone] at their earliest convenience. ? ?Unable to leave voicemail as mailbox has not been set up on [phone]. I then called [phone], which I was able to leave a voicemail regarding reason of call. ? ?[name] ?Care Guide, Embedded Care Coordination ?[facility]  Care management  ?[place], [place] [address]  ?Main Phone: [phone]  E-mail: [email or web address]  ?Website: [email or web address] ?  ?

## 2021-05-29 ENCOUNTER — Ambulatory Visit: Payer: Medicare Other | Admitting: Diagnostic Neuroimaging

## 2021-06-19 ENCOUNTER — Other Ambulatory Visit: Payer: Medicare Other

## 2021-06-19 DIAGNOSIS — Z79899 Other long term (current) drug therapy: Secondary | ICD-10-CM | POA: Diagnosis not present

## 2021-06-19 DIAGNOSIS — M0579 Rheumatoid arthritis with rheumatoid factor of multiple sites without organ or systems involvement: Secondary | ICD-10-CM | POA: Diagnosis not present

## 2021-07-10 ENCOUNTER — Other Ambulatory Visit: Payer: Self-pay | Admitting: Family Medicine

## 2021-07-10 DIAGNOSIS — N401 Enlarged prostate with lower urinary tract symptoms: Secondary | ICD-10-CM

## 2021-07-15 ENCOUNTER — Ambulatory Visit: Payer: Medicare Other | Admitting: Diagnostic Neuroimaging

## 2021-08-08 ENCOUNTER — Other Ambulatory Visit: Payer: Medicare Other

## 2021-08-08 DIAGNOSIS — Z79899 Other long term (current) drug therapy: Secondary | ICD-10-CM | POA: Diagnosis not present

## 2021-08-08 DIAGNOSIS — M0579 Rheumatoid arthritis with rheumatoid factor of multiple sites without organ or systems involvement: Secondary | ICD-10-CM | POA: Diagnosis not present

## 2021-08-23 ENCOUNTER — Ambulatory Visit: Payer: Medicare Other | Admitting: Diagnostic Neuroimaging

## 2021-09-18 ENCOUNTER — Ambulatory Visit (INDEPENDENT_AMBULATORY_CARE_PROVIDER_SITE_OTHER): Payer: Medicare Other | Admitting: Family Medicine

## 2021-09-18 ENCOUNTER — Encounter: Payer: Self-pay | Admitting: Family Medicine

## 2021-09-18 VITALS — BP 102/66 | HR 83 | Temp 98.9°F | Ht 71.5 in | Wt 209.0 lb

## 2021-09-18 DIAGNOSIS — I10 Essential (primary) hypertension: Secondary | ICD-10-CM

## 2021-09-18 DIAGNOSIS — E7800 Pure hypercholesterolemia, unspecified: Secondary | ICD-10-CM

## 2021-09-18 DIAGNOSIS — D696 Thrombocytopenia, unspecified: Secondary | ICD-10-CM | POA: Diagnosis not present

## 2021-09-18 DIAGNOSIS — I679 Cerebrovascular disease, unspecified: Secondary | ICD-10-CM | POA: Insufficient documentation

## 2021-09-18 DIAGNOSIS — R2681 Unsteadiness on feet: Secondary | ICD-10-CM

## 2021-09-18 DIAGNOSIS — R739 Hyperglycemia, unspecified: Secondary | ICD-10-CM | POA: Diagnosis not present

## 2021-09-18 DIAGNOSIS — I6522 Occlusion and stenosis of left carotid artery: Secondary | ICD-10-CM | POA: Diagnosis not present

## 2021-09-18 DIAGNOSIS — E559 Vitamin D deficiency, unspecified: Secondary | ICD-10-CM | POA: Diagnosis not present

## 2021-09-18 NOTE — Progress Notes (Signed)
Subjective:  Patient ID: Austin Cox, male    DOB: 1945-12-11, 76 y.o.   MRN: [id number]  Patient Care Team: Baruch Gouty, FNP as PCP - General (Family Medicine)   Chief Complaint:  Medical Management of Chronic Issues   HPI: Austin Cox is a 76 y.o. male presenting on 09/18/2021 for Medical Management of Chronic Issues   1. Essential (primary) hypertension Complaint with medications without associated side effects. No chest pain, headaches, visual changes, confusion, weakness, leg swelling, or syncope. Continues to have gait instability with dizziness at times. Has not followed up with neurology. Wife states she has to call to reschedule his appointment.   2. Pure hypercholesterolemia On statin therapy and tolerating well. No myalgias. Does not follow a strict diet, does not exercise.   3. Vitamin D deficiency On over the counter repletion therapy. Denies worsening arthralgias.   4. Thrombocytopenia (HCC) No abnormal bleeding or bruising. Rheumatology has changed dosing of methotrexate.   5. Stenosis of left carotid artery Had carotid endarterectomy 04/2021 and has done well post procedure.   6. Gait instability 7. Cerebrovascular small vessel disease Has had MRI, small vessel disease noted, was referred to neurology but has not received an appointment. Has continued gait instability, not new or worsening.      Relevant past medical, surgical, family, and social history reviewed and updated as indicated.  Allergies and medications reviewed and updated. Data reviewed: Chart in Epic.   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    Past Surgical History:  Procedure Laterality Date   CATARACT EXTRACTION     ENDARTERECTOMY Left 04/15/2021   Procedure: LEFT CAROTID ENDARTERECTOMY WITH;  Surgeon: Marty Heck, MD;  Location: [facility];  Service: Vascular;  Laterality: Left;   EYE SURGERY     detached retina right eye   FRACTURE SURGERY     right leg   Stent in heart      Social History   Socioeconomic History   Marital status: Married    Spouse name: Not on file   Number of children: 3   Years of education: Not on file   Highest education level: Not on file  Occupational History   Occupation: retired  Tobacco Use   Smoking status: Former    Packs/day: 0.20    Years: 10.00    Total pack years: 2.00    Types: Cigarettes    Quit date: 07/06/1974    Years since quitting: 47.2   Smokeless tobacco: Never  Vaping Use   Vaping Use: Never used  Substance and Sexual Activity   Alcohol use: Not Currently   Drug use: Never   Sexual activity: Not on file  Other Topics Concern   Not on file  Social History Narrative   3 children - one passed away   Son in [place]   They raised 2 teenage grandchildren after their daughter and son in law both passed away    Social Determinants of Health   Financial Resource Strain: Medium Risk (04/05/2021)   Overall Financial Resource Strain (CARDIA)    Difficulty of Paying Living Expenses: Somewhat hard  Food Insecurity: Food Insecurity Present (04/05/2021)   Hunger Vital Sign    Worried About Charity fundraiser in  the Last Year: Often true    [place] in the Last Year: Sometimes true  Transportation Needs: No Transportation Needs (04/05/2021)   PRAPARE - Hydrologist (Medical): No    Lack of Transportation (Non-Medical): No  Physical Activity: Insufficiently Active (04/05/2021)   Exercise Vital Sign    Days of Exercise per Week: 6 days    Minutes of Exercise per Session: 20 min  Stress: No Stress Concern Present (04/05/2021)   [place]    Feeling of Stress : Not at all  Social Connections: Moderately Isolated (04/05/2021)   Social  Connection and Isolation Panel [NHANES]    Frequency of Communication with Friends and Family: More than three times a week    Frequency of Social Gatherings with Friends and Family: Once a week    Attends Religious Services: Never    Marine scientist or Organizations: No    Attends Archivist Meetings: Never    Marital Status: Married  Human resources officer Violence: Not At Risk (04/05/2021)   Humiliation, Afraid, Rape, and Kick questionnaire    Fear of Current or Ex-Partner: No    Emotionally Abused: No    Physically Abused: No    Sexually Abused: No    Outpatient Encounter Medications as of 09/18/2021  Medication Sig   acetaminophen (TYLENOL) 650 MG CR tablet Take 650 mg by mouth every 8 (eight) hours as needed for pain.   aspirin 81 MG EC tablet Take 81 mg by mouth daily.   Cholecalciferol 25 MCG (1000 UT) capsule Take 1 capsule (1,000 Units total) by mouth daily.   diclofenac Sodium (VOLTAREN) 1 % GEL Apply 1 application  topically 4 (four) times daily as needed (pain).   etanercept (ENBREL SURECLICK) 50 MG/ML injection Inject 50 mg into the skin every Friday.   folic acid (FOLVITE) 1 MG tablet Take 1 mg by mouth daily.   gabapentin (NEURONTIN) 300 MG capsule Take 300 mg by mouth 2 (two) times daily.   HYDROcodone-acetaminophen (NORCO) 5-325 MG tablet Take 1 tablet by mouth every 6 (six) hours as needed for moderate pain.   methotrexate (RHEUMATREX) 2.5 MG tablet Take 12.5 mg by mouth every Tuesday.   metoprolol succinate (TOPROL-XL) 25 MG 24 hr tablet Take 1 tablet (25 mg total) by mouth daily.   nitroGLYCERIN (NITROSTAT) 0.3 MG SL tablet PLACE 1 TABLET UNDER THE TONGUE EVERY 5 MINS AS NEEDED FOR CHEST PAIN   rosuvastatin (CRESTOR) 40 MG tablet Take 1 tablet (40 mg total) by mouth daily.   Tetrahydrozoline HCl (VISINE OP) Place 1 drop into both eyes daily as needed (dry eyes).   vitamin B-12 (CYANOCOBALAMIN) 1000 MCG tablet Take 1,000 mcg by mouth daily.   [DISCONTINUED]  tamsulosin (FLOMAX) 0.4 MG CAPS capsule TAKE 2 CAPSULES BY MOUTH DAILY   No facility-administered encounter medications on file as of 09/18/2021.    No Known Allergies  Review of Systems  Constitutional:  Negative for activity change, appetite change, chills, diaphoresis, fatigue, fever and unexpected weight change.  HENT: Negative.    Eyes: Negative.  Negative for photophobia and visual disturbance.  Respiratory:  Negative for cough, chest tightness and shortness of breath.   Cardiovascular:  Negative for chest pain, palpitations and leg swelling.  Gastrointestinal:  Negative for abdominal pain, blood in stool, constipation, diarrhea, nausea and vomiting.  Endocrine: Negative.   Genitourinary:  Negative for decreased urine volume, difficulty urinating, dysuria,  frequency and urgency.  Musculoskeletal:  Positive for arthralgias, back pain and gait problem. Negative for myalgias.  Skin: Negative.   Allergic/Immunologic: Negative.   Neurological:  Positive for dizziness. Negative for tremors, seizures, syncope, facial asymmetry, speech difficulty, weakness, light-headedness, numbness and headaches.  Hematological: Negative.   Psychiatric/Behavioral:  Negative for confusion, hallucinations, sleep disturbance and suicidal ideas.   All other systems reviewed and are negative.       Objective:  BP 102/66   Pulse 83   Temp 98.9 F (37.2 C)   Ht 5' 11.5" (1.816 m)   Wt 209 lb (94.8 kg)   SpO2 94%   BMI 28.74 kg/m    Wt Readings from Last 3 Encounters:  09/18/21 209 lb (94.8 kg)  05/24/21 211 lb (95.7 kg)  05/07/21 205 lb (93 kg)    Physical Exam Vitals and nursing note reviewed.  Constitutional:      Appearance: Normal appearance. He is overweight.  HENT:     Head: Normocephalic and atraumatic.     Right Ear: Decreased hearing noted.     Left Ear: Decreased hearing noted.     Mouth/Throat:     Mouth: Mucous membranes are moist.  Eyes:     Comments: Right pupil 2 mm, left  pupil 4 mm, normal for pt  Cardiovascular:     Rate and Rhythm: Normal rate and regular rhythm.     Heart sounds: Normal heart sounds. No murmur heard.    No friction rub. No gallop.  Pulmonary:     Effort: Pulmonary effort is normal.     Breath sounds: Normal breath sounds.  Musculoskeletal:     Right lower leg: No edema.     Left lower leg: No edema.  Skin:    General: Skin is warm and dry.     Capillary Refill: Capillary refill takes less than 2 seconds.  Neurological:     General: No focal deficit present.     Mental Status: He is alert and oriented to person, place, and time.     Cranial Nerves: No cranial nerve deficit.     Sensory: No sensory deficit.     Motor: No weakness.     Coordination: Coordination normal.     Gait: Gait abnormal (short steps, forward leaning, using walking stick).     Deep Tendon Reflexes: Reflexes normal.  Psychiatric:        Mood and Affect: Mood normal.        Behavior: Behavior is cooperative.        Thought Content: Thought content normal.        Judgment: Judgment normal.     Results for orders placed or performed in visit on 05/24/21  Urine Culture   Specimen: Urine   UR  Result Value Ref Range   Urine Culture, Routine Final report    Organism ID, Bacteria No growth   Microscopic Examination   Urine  Result Value Ref Range   WBC, UA None seen 0 - 5 /hpf   RBC, Urine 0-2 0 - 2 /hpf   Epithelial Cells (non renal) 0-10 0 - 10 /hpf   Renal Epithel, UA None seen None seen /hpf   Bacteria, UA Few (A) None seen/Few  Urinalysis, Complete  Result Value Ref Range   Specific Gravity, UA 1.010 1.005 - 1.030   pH, UA 7.0 5.0 - 7.5   Color, UA Yellow Yellow   Appearance Ur Clear Clear   Leukocytes,UA Negative Negative  Protein,UA 2+ (A) Negative/Trace   Glucose, UA Negative Negative   Ketones, UA Negative Negative   RBC, UA 1+ (A) Negative   Bilirubin, UA Negative Negative   Urobilinogen, Ur 4.0 (H) 0.2 - 1.0 mg/dL   Nitrite, UA  Negative Negative   Microscopic Examination See below:   PSA, total and free  Result Value Ref Range   Prostate Specific Ag, Serum 4.0 0.0 - 4.0 ng/mL   PSA, Free 0.81 N/A ng/mL   PSA, Free Pct 20.3 %  CBC with Differential/Platelet  Result Value Ref Range   WBC 13.3 (H) 3.4 - 10.8 x10E3/uL   RBC 4.46 4.14 - 5.80 x10E6/uL   Hemoglobin 12.4 (L) 13.0 - 17.7 g/dL   Hematocrit 37.2 (L) 37.5 - 51.0 %   MCV 83 79 - 97 fL   MCH 27.8 26.6 - 33.0 pg   MCHC 33.3 31.5 - 35.7 g/dL   RDW 16.0 (H) 11.6 - 15.4 %   Platelets 85 (LL) 150 - 450 x10E3/uL   Neutrophils 54 Not Estab. %   Lymphs 14 Not Estab. %   Monocytes 30 Not Estab. %   Eos 1 Not Estab. %   Basos 0 Not Estab. %   Immature Cells Note    Neutrophils Absolute 7.2 (H) 1.4 - 7.0 x10E3/uL   Lymphocytes Absolute 1.9 0.7 - 3.1 x10E3/uL   Monocytes Absolute 4.0 (H) 0.1 - 0.9 x10E3/uL   EOS (ABSOLUTE) 0.1 0.0 - 0.4 x10E3/uL   Basophils Absolute 0.0 0.0 - 0.2 x10E3/uL   Hematology Comments: Note:   Immature Cells  Result Value Ref Range   Metamyelocytes 1 (H) 0 - 0 %       Pertinent labs & imaging results that were available during my care of the patient were reviewed by me and considered in my medical decision making.  Assessment & Plan:  Austin Cox was seen today for medical management of chronic issues.  Diagnoses and all orders for this visit:  Essential (primary) hypertension BP well controlled. Changes were not made in regimen today. Goal BP is 130/80. Pt aware to report any persistent high or low readings. DASH diet and exercise encouraged. Exercise at least 150 minutes per week and increase as tolerated. Goal BMI > 25. Stress management encouraged. Avoid nicotine and tobacco product use. Avoid excessive alcohol and NSAID's. Avoid more than 2000 mg of sodium daily. Medications as prescribed. Follow up as scheduled.  -     CBC with Differential/Platelet -     CMP14+EGFR -     Lipid panel  Pure hypercholesterolemia Diet  encouraged - increase intake of fresh fruits and vegetables, increase intake of lean proteins. Bake, broil, or grill foods. Avoid fried, greasy, and fatty foods. Avoid fast foods. Increase intake of fiber-rich whole grains. Exercise encouraged - at least 150 minutes per week and advance as tolerated.  Goal BMI < 25. Continue medications as prescribed. Follow up in 3-6 months as discussed.  -     Lipid panel  Vitamin D deficiency Labs pending. Continue repletion therapy. If indicated, will change repletion dosage. Eat foods rich in Vit D including milk, orange juice, yogurt with vitamin D added, salmon or mackerel, canned tuna fish, cereals with vitamin D added, and cod liver oil. Get out in the sun but make sure to wear at least SPF 30 sunscreen.  -     VITAMIN D 25 Hydroxy (Vit-D Deficiency, Fractures)  Thrombocytopenia (HCC) No abnormal bleeding or bruising. Will recheck labs today. -  CBC with Differential/Platelet  Stenosis of left carotid artery S/P endarterectomy 04/2021 and doing well.   Gait instability Cerebrovascular small vessel disease Ongoing. MRI without acute findings. Has not followed up neurology, will place new referral today. -     Ambulatory referral to Neurology     Continue all other maintenance medications.  Follow up plan: Return in about 4 months (around 01/19/2022), or if symptoms worsen or fail to improve.   Continue healthy lifestyle choices, including diet (rich in fruits, vegetables, and lean proteins, and low in salt and simple carbohydrates) and exercise (at least 30 minutes of moderate physical activity daily).  Educational handout given for health maintenance  The above assessment and management plan was discussed with the patient. The patient verbalized understanding of and has agreed to the management plan. Patient is aware to call the clinic if they develop any new symptoms or if symptoms persist or worsen. Patient is aware when to return to the  clinic for a follow-up visit. Patient educated on when it is appropriate to go to the emergency department.   Monia Pouch, FNP-C [facility] [phone]

## 2021-09-19 LAB — CBC WITH DIFFERENTIAL/PLATELET
Basophils Absolute: 0 10*3/uL (ref 0.0–0.2)
Basos: 0 %
EOS (ABSOLUTE): 0 10*3/uL (ref 0.0–0.4)
Eos: 0 %
Hematocrit: 39.3 % (ref 37.5–51.0)
Hemoglobin: 13 g/dL (ref 13.0–17.7)
Lymphocytes Absolute: 1 10*3/uL (ref 0.7–3.1)
Lymphs: 29 %
MCH: 27.3 pg (ref 26.6–33.0)
MCHC: 33.1 g/dL (ref 31.5–35.7)
MCV: 82 fL (ref 79–97)
Monocytes Absolute: 0.7 10*3/uL (ref 0.1–0.9)
Monocytes: 21 %
Neutrophils Absolute: 1.7 10*3/uL (ref 1.4–7.0)
Neutrophils: 50 %
Platelets: 72 10*3/uL — CL (ref 150–450)
RBC: 4.77 x10E6/uL (ref 4.14–5.80)
RDW: 16.3 % — ABNORMAL HIGH (ref 11.6–15.4)
WBC: 3.3 10*3/uL — ABNORMAL LOW (ref 3.4–10.8)

## 2021-09-19 LAB — CMP14+EGFR
ALT: 19 IU/L (ref 0–44)
AST: 17 IU/L (ref 0–40)
Albumin/Globulin Ratio: 1.9 (ref 1.2–2.2)
Albumin: 4.2 g/dL (ref 3.7–4.7)
Alkaline Phosphatase: 73 IU/L (ref 44–121)
BUN/Creatinine Ratio: 11 (ref 10–24)
BUN: 12 mg/dL (ref 8–27)
Bilirubin Total: 0.8 mg/dL (ref 0.0–1.2)
CO2: 24 mmol/L (ref 20–29)
Calcium: 10.1 mg/dL (ref 8.6–10.2)
Chloride: 102 mmol/L (ref 96–106)
Creatinine, Ser: 1.11 mg/dL (ref 0.76–1.27)
Globulin, Total: 2.2 g/dL (ref 1.5–4.5)
Glucose: 194 mg/dL — ABNORMAL HIGH (ref 70–99)
Potassium: 3.7 mmol/L (ref 3.5–5.2)
Sodium: 141 mmol/L (ref 134–144)
Total Protein: 6.4 g/dL (ref 6.0–8.5)
eGFR: 68 mL/min/{1.73_m2} (ref >59)

## 2021-09-19 LAB — LIPID PANEL
Chol/HDL Ratio: 3.2 ratio (ref 0.0–5.0)
Cholesterol, Total: 84 mg/dL — ABNORMAL LOW (ref 100–199)
HDL: 26 mg/dL — ABNORMAL LOW (ref >39)
LDL Chol Calc (NIH): 40 mg/dL (ref 0–99)
Triglycerides: 88 mg/dL (ref 0–149)
VLDL Cholesterol Cal: 18 mg/dL (ref 5–40)

## 2021-09-19 LAB — VITAMIN D 25 HYDROXY (VIT D DEFICIENCY, FRACTURES): Vit D, 25-Hydroxy: 50.9 ng/mL (ref 30.0–100.0)

## 2021-09-19 NOTE — Addendum Note (Signed)
Addended by: Sonny Masters on: 09/19/2021 08:04 AM   Modules accepted: Orders

## 2021-09-20 ENCOUNTER — Inpatient Hospital Stay (HOSPITAL_COMMUNITY): Payer: Medicare Other | Attending: Hematology | Admitting: Hematology

## 2021-09-20 ENCOUNTER — Inpatient Hospital Stay (HOSPITAL_COMMUNITY): Payer: Medicare Other

## 2021-09-20 ENCOUNTER — Encounter (HOSPITAL_COMMUNITY): Payer: Self-pay | Admitting: Hematology

## 2021-09-20 VITALS — BP 135/85 | HR 72 | Resp 16 | Ht 71.5 in | Wt 203.9 lb

## 2021-09-20 DIAGNOSIS — D696 Thrombocytopenia, unspecified: Secondary | ICD-10-CM | POA: Diagnosis not present

## 2021-09-20 DIAGNOSIS — Z807 Family history of other malignant neoplasms of lymphoid, hematopoietic and related tissues: Secondary | ICD-10-CM | POA: Insufficient documentation

## 2021-09-20 DIAGNOSIS — M069 Rheumatoid arthritis, unspecified: Secondary | ICD-10-CM | POA: Insufficient documentation

## 2021-09-20 DIAGNOSIS — Z87891 Personal history of nicotine dependence: Secondary | ICD-10-CM | POA: Diagnosis not present

## 2021-09-20 DIAGNOSIS — Z79899 Other long term (current) drug therapy: Secondary | ICD-10-CM | POA: Insufficient documentation

## 2021-09-20 DIAGNOSIS — Z803 Family history of malignant neoplasm of breast: Secondary | ICD-10-CM | POA: Diagnosis not present

## 2021-09-20 DIAGNOSIS — Z808 Family history of malignant neoplasm of other organs or systems: Secondary | ICD-10-CM | POA: Insufficient documentation

## 2021-09-20 LAB — CBC WITH DIFFERENTIAL/PLATELET
Abs Immature Granulocytes: 0.05 10*3/uL (ref 0.00–0.07)
Basophils Absolute: 0 10*3/uL (ref 0.0–0.1)
Basophils Relative: 0 %
Eosinophils Absolute: 0.1 10*3/uL (ref 0.0–0.5)
Eosinophils Relative: 2 %
HCT: 40.5 % (ref 39.0–52.0)
Hemoglobin: 12.9 g/dL — ABNORMAL LOW (ref 13.0–17.0)
Immature Granulocytes: 1 %
Lymphocytes Relative: 28 %
Lymphs Abs: 1.1 10*3/uL (ref 0.7–4.0)
MCH: 26.9 pg (ref 26.0–34.0)
MCHC: 31.9 g/dL (ref 30.0–36.0)
MCV: 84.4 fL (ref 80.0–100.0)
Monocytes Absolute: 1.1 10*3/uL — ABNORMAL HIGH (ref 0.1–1.0)
Monocytes Relative: 29 %
Neutro Abs: 1.6 10*3/uL — ABNORMAL LOW (ref 1.7–7.7)
Neutrophils Relative %: 40 %
Platelets: 71 10*3/uL — ABNORMAL LOW (ref 150–400)
RBC: 4.8 MIL/uL (ref 4.22–5.81)
RDW: 17.3 % — ABNORMAL HIGH (ref 11.5–15.5)
WBC: 3.9 10*3/uL — ABNORMAL LOW (ref 4.0–10.5)
nRBC: 0 % (ref 0.0–0.2)

## 2021-09-20 LAB — VITAMIN B12: Vitamin B-12: 976 pg/mL — ABNORMAL HIGH (ref 180–914)

## 2021-09-20 LAB — HGB A1C W/O EAG: Hgb A1c MFr Bld: 5.6 % (ref 4.8–5.6)

## 2021-09-20 LAB — RETICULOCYTES
Immature Retic Fract: 10.2 % (ref 2.3–15.9)
RBC.: 4.75 MIL/uL (ref 4.22–5.81)
Retic Count, Absolute: 42.3 10*3/uL (ref 19.0–186.0)
Retic Ct Pct: 0.9 % (ref 0.4–3.1)

## 2021-09-20 LAB — HEPATITIS B CORE ANTIBODY, TOTAL: Hep B Core Total Ab: NONREACTIVE

## 2021-09-20 LAB — HEPATITIS B SURFACE ANTIGEN: Hepatitis B Surface Ag: NONREACTIVE

## 2021-09-20 LAB — LACTATE DEHYDROGENASE: LDH: 131 U/L (ref 98–192)

## 2021-09-20 LAB — HEPATITIS C ANTIBODY: HCV Ab: NONREACTIVE

## 2021-09-20 LAB — SPECIMEN STATUS REPORT

## 2021-09-20 LAB — FOLATE: Folate: 21 ng/mL (ref >5.9)

## 2021-09-20 LAB — HEPATITIS B SURFACE ANTIBODY,QUALITATIVE: Hep B S Ab: NONREACTIVE

## 2021-09-20 NOTE — Progress Notes (Signed)
[facility] [address], [place] [address]   CLINIC:  Medical Oncology/Hematology  Patient Care Team: Rakes, Doralee Albino, FNP as PCP - General (Family Medicine) Doreatha Massed, MD as Medical Oncologist (Hematology)  CHIEF COMPLAINTS/PURPOSE OF CONSULTATION:  Evaluation for thrombocytopenia  HISTORY OF PRESENTING ILLNESS:  Austin Cox 76 y.o. male is here because of evaluation for thrombocytopenia, at the request of [facility].  Today he reports feeling good, and he is accompanied by his wife. He denies bleeding issues, and he reports easy bruising. He takes 81 MG aspirin. He denies fevers, night sweats, and weight loss. He walk with a cane or walker due to RA affecting his knees and ankles; he has had RA for 5 years. He has been on Enbrel and Methotrexate for 2-3 years. He denies taking quinine supplements.   He lives at home with his wife. 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 He quit smoking 45 years ago. He denies family history of thrombocytopenia. His sister died of breast cancer at 73 years old, his daughter died with non-Hodgkin's lymphoma at 78 years old, and his mother had head and neck cancer.   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    SURGICAL HISTORY: Past Surgical History:  Procedure Laterality Date   CATARACT EXTRACTION     ENDARTERECTOMY Left 04/15/2021   Procedure: LEFT CAROTID ENDARTERECTOMY WITH;  Surgeon: Cephus Shelling, MD;  Location: [facility] OR;  Service: Vascular;  Laterality: Left;   EYE SURGERY     detached retina right eye   FRACTURE SURGERY     right leg   Stent in heart      SOCIAL HISTORY: Social History   Socioeconomic History   Marital status:  Married    Spouse name: Not on file   Number of children: 3   Years of education: Not on file   Highest education level: Not on file  Occupational History   Occupation: retired  Tobacco Use   Smoking status: Former    Packs/day: 0.20    Years: 10.00    Total pack years: 2.00    Types: Cigarettes    Quit date: 07/06/1974    Years since quitting: 47.2   Smokeless tobacco: Never  Vaping Use   Vaping Use: Never used  Substance and Sexual Activity   Alcohol use: Not Currently   Drug use: Never   Sexual activity: Not on file  Other Topics Concern   Not on file  Social History Narrative   3 children - one passed away   Son in [place]   They raised 2 teenage grandchildren after their daughter and son in law both passed away    Social Determinants of Health   Financial Resource Strain: Medium Risk (04/05/2021)   Overall Financial Resource Strain (CARDIA)    Difficulty of Paying Living Expenses: Somewhat hard  Food Insecurity: Food Insecurity Present (04/05/2021)   Hunger Vital Sign    Worried About Running Out of Food in the Last Year: Often true    Ran Out of Food in the Last Year: Sometimes true  Transportation Needs: No Transportation Needs (04/05/2021)   PRAPARE - Transportation  Lack of Transportation (Medical): No    Lack of Transportation (Non-Medical): No  Physical Activity: Insufficiently Active (04/05/2021)   Exercise Vital Sign    Days of Exercise per Week: 6 days    Minutes of Exercise per Session: 20 min  Stress: No Stress Concern Present (04/05/2021)   [place]    Feeling of Stress : Not at all  Social Connections: Moderately Isolated (04/05/2021)   Social Connection and Isolation Panel [NHANES]    Frequency of Communication with Friends and Family: More than three times a week    Frequency of Social Gatherings with Friends and Family: Once a week    Attends Religious Services: Never    Building surveyor or Organizations: No    Attends Archivist Meetings: Never    Marital Status: Married  Human resources officer Violence: Not At Risk (04/05/2021)   Humiliation, Afraid, Rape, and Kick questionnaire    Fear of Current or Ex-Partner: No    Emotionally Abused: No    Physically Abused: No    Sexually Abused: No    FAMILY HISTORY: History reviewed. No pertinent family history.  ALLERGIES:  has No Known Allergies.  MEDICATIONS:  Current Outpatient Medications  Medication Sig Dispense Refill   acetaminophen (TYLENOL) 650 MG CR tablet Take 650 mg by mouth every 8 (eight) hours as needed for pain.     aspirin 81 MG EC tablet Take 81 mg by mouth daily.     Cholecalciferol 25 MCG (1000 UT) capsule Take 1 capsule (1,000 Units total) by mouth daily. 90 capsule 2   diclofenac Sodium (VOLTAREN) 1 % GEL Apply 1 application  topically 4 (four) times daily as needed (pain).     etanercept (ENBREL SURECLICK) 50 MG/ML injection Inject 50 mg into the skin every Friday.     folic acid (FOLVITE) 1 MG tablet Take 1 mg by mouth daily.     gabapentin (NEURONTIN) 300 MG capsule Take 300 mg by mouth 2 (two) times daily.     HYDROcodone-acetaminophen (NORCO) 5-325 MG tablet Take 1 tablet by mouth every 6 (six) hours as needed for moderate pain. 8 tablet 0   methotrexate (RHEUMATREX) 2.5 MG tablet Take 5 mg by mouth every Tuesday.     metoprolol succinate (TOPROL-XL) 25 MG 24 hr tablet Take 1 tablet (25 mg total) by mouth daily. 90 tablet 2   rosuvastatin (CRESTOR) 40 MG tablet Take 1 tablet (40 mg total) by mouth daily. 90 tablet 2   Tetrahydrozoline HCl (VISINE OP) Place 1 drop into both eyes daily as needed (dry eyes).     vitamin B-12 (CYANOCOBALAMIN) 1000 MCG tablet Take 1,000 mcg by mouth daily.     nitroGLYCERIN (NITROSTAT) 0.3 MG SL tablet PLACE 1 TABLET UNDER THE TONGUE EVERY 5 MINS AS NEEDED FOR CHEST PAIN (Patient not taking: Reported on 09/20/2021)     No current facility-administered  medications for this visit.    REVIEW OF SYSTEMS:   Review of Systems  Constitutional:  Negative for appetite change and fatigue.  Genitourinary:  Positive for frequency.   Musculoskeletal:  Positive for arthralgias (5/10 hips).  Neurological:  Positive for dizziness.  All other systems reviewed and are negative.    PHYSICAL EXAMINATION: ECOG PERFORMANCE STATUS: 1 - Symptomatic but completely ambulatory  Vitals:   09/20/21 1022  BP: 135/85  Pulse: 72  Resp: 16  SpO2: 93%   Filed Weights   09/20/21 1022  Weight:  203 lb 14.4 oz (92.5 kg)   Physical Exam Vitals reviewed.  Constitutional:      Appearance: Normal appearance.  Cardiovascular:     Rate and Rhythm: Normal rate and regular rhythm.     Pulses: Normal pulses.     Heart sounds: Normal heart sounds.  Pulmonary:     Effort: Pulmonary effort is normal.     Breath sounds: Normal breath sounds.  Abdominal:     Palpations: Abdomen is soft. There is no hepatomegaly, splenomegaly or mass.     Tenderness: There is no abdominal tenderness.  Musculoskeletal:     Right lower leg: 1+ Edema present.     Left lower leg: 1+ Edema present.  Lymphadenopathy:     Cervical: No cervical adenopathy.     Right cervical: No superficial cervical adenopathy.    Left cervical: No superficial cervical adenopathy.     Upper Body:     Right upper body: No supraclavicular or axillary adenopathy.     Left upper body: No supraclavicular or axillary adenopathy.     Lower Body: No right inguinal adenopathy. No left inguinal adenopathy.  Neurological:     General: No focal deficit present.     Mental Status: He is alert and oriented to person, place, and time.  Psychiatric:        Mood and Affect: Mood normal.        Behavior: Behavior normal.      LABORATORY DATA:  I have reviewed the data as listed Recent Results (from the past 2160 hour(s))  CBC with Differential/Platelet     Status: Abnormal   Collection Time: 09/18/21  8:26 AM   Result Value Ref Range   WBC 3.3 (L) 3.4 - 10.8 x10E3/uL   RBC 4.77 4.14 - 5.80 x10E6/uL   Hemoglobin 13.0 13.0 - 17.7 g/dL   Hematocrit 39.3 37.5 - 51.0 %   MCV 82 79 - 97 fL   MCH 27.3 26.6 - 33.0 pg   MCHC 33.1 31.5 - 35.7 g/dL   RDW 16.3 (H) 11.6 - 15.4 %   Platelets 72 (LL) 150 - 450 x10E3/uL    Comment: Actual platelet count may be somewhat higher than reported due to aggregation of platelets in this sample.    Neutrophils 50 Not Estab. %   Lymphs 29 Not Estab. %   Monocytes 21 Not Estab. %   Eos 0 Not Estab. %   Basos 0 Not Estab. %   Neutrophils Absolute 1.7 1.4 - 7.0 x10E3/uL   Lymphocytes Absolute 1.0 0.7 - 3.1 x10E3/uL   Monocytes Absolute 0.7 0.1 - 0.9 x10E3/uL   EOS (ABSOLUTE) 0.0 0.0 - 0.4 x10E3/uL   Basophils Absolute 0.0 0.0 - 0.2 x10E3/uL   Hematology Comments: Note:     Comment: Manual differential was performed.  CMP14+EGFR     Status: Abnormal   Collection Time: 09/18/21  8:26 AM  Result Value Ref Range   Glucose 194 (H) 70 - 99 mg/dL   BUN 12 8 - 27 mg/dL   Creatinine, Ser 1.11 0.76 - 1.27 mg/dL   eGFR 68 >59 mL/min/1.73   BUN/Creatinine Ratio 11 10 - 24   Sodium 141 134 - 144 mmol/L   Potassium 3.7 3.5 - 5.2 mmol/L   Chloride 102 96 - 106 mmol/L   CO2 24 20 - 29 mmol/L   Calcium 10.1 8.6 - 10.2 mg/dL   Total Protein 6.4 6.0 - 8.5 g/dL   Albumin 4.2 3.7 - 4.7 g/dL  Comment:                **Effective September 23, 2021 Albumin reference interval**                  will be changing to:                             Age                  Male          Male                            0 -   7 days       3.6 - 4.9      3.6 - 4.9                            8 -  30 days       3.5 - 4.6      3.5 - 4.6                            1 -   6 months     3.7 - 4.8      3.7 - 4.8                     7 months -   2 years      4.0 - 5.0      4.0 - 5.0                            3 -   5 years      4.1 - 5.0      4.1 - 5.0                            6 -  12 years       4.2 - 5.0      4.2 - 5.0                           13 -  30 years      4.3 - 5.2      4.0 - 5.0                           31 -  50 years      4.1 - 5.1      3.9 - 4.9                           51 -  60 years      3.8 - 4.9      3.8 - 4.9                           61 -  70 years      3.9 - 4.9      3.9 - 4.9  71 -  80 years      3.8 - 4.8      3.8 - 4.$Remov'8                           8 1 'NYSykQ$ -  89 years      3.7 - 4.7      3.7 - 4.7                           90 - 199 years      3.6 - 4.6      3.6 - 4.6    Globulin, Total 2.2 1.5 - 4.5 g/dL   Albumin/Globulin Ratio 1.9 1.2 - 2.2   Bilirubin Total 0.8 0.0 - 1.2 mg/dL   Alkaline Phosphatase 73 44 - 121 IU/L   AST 17 0 - 40 IU/L   ALT 19 0 - 44 IU/L  Lipid panel     Status: Abnormal   Collection Time: 09/18/21  8:26 AM  Result Value Ref Range   Cholesterol, Total 84 (L) 100 - 199 mg/dL   Triglycerides 88 0 - 149 mg/dL   HDL 26 (L) >39 mg/dL   VLDL Cholesterol Cal 18 5 - 40 mg/dL   LDL Chol Calc (NIH) 40 0 - 99 mg/dL   Chol/HDL Ratio 3.2 0.0 - 5.0 ratio    Comment:                                   T. Chol/HDL Ratio                                             Men  Women                               1/2 Avg.Risk  3.4    3.3                                   Avg.Risk  5.0    4.4                                2X Avg.Risk  9.6    7.1                                3X Avg.Risk 23.4   11.0   VITAMIN D 25 Hydroxy (Vit-D Deficiency, Fractures)     Status: None   Collection Time: 09/18/21  8:26 AM  Result Value Ref Range   Vit D, 25-Hydroxy 50.9 30.0 - 100.0 ng/mL    Comment: Vitamin D deficiency has been defined by the [place] practice guideline as a level of serum 25-OH vitamin D less than 20 ng/mL (1,2). The Endocrine Society went on to further define vitamin D insufficiency as a level between 21 and 29 ng/mL (2). 1. IOM (Institute of Medicine). 2010. Dietary reference    intakes  for calcium and D. [place]: The  [employer]. 2. Holick MF, Binkley [place], Bischoff-Ferrari HA, et al.    Evaluation, treatment, and prevention of vitamin D    deficiency: an Endocrine Society clinical practice    guideline. JCEM. 2011 Jul; 96(7):1911-30.   Hgb A1c w/o eAG     Status: None (Preliminary result)   Collection Time: 09/18/21  8:26 AM  Result Value Ref Range   Hgb A1c MFr Bld WILL FOLLOW   Specimen status report     Status: None   Collection Time: 09/18/21  8:26 AM  Result Value Ref Range   specimen status report Comment     Comment: Written Authorization Written Authorization Written Authorization Received. Authorization received from [employer] 09-20-2021 Logged by Leona Carry   CBC with Differential     Status: Abnormal   Collection Time: 09/20/21 11:56 AM  Result Value Ref Range   WBC 3.9 (L) 4.0 - 10.5 K/uL   RBC 4.80 4.22 - 5.81 MIL/uL   Hemoglobin 12.9 (L) 13.0 - 17.0 g/dL   HCT 40.5 39.0 - 52.0 %   MCV 84.4 80.0 - 100.0 fL   MCH 26.9 26.0 - 34.0 pg   MCHC 31.9 30.0 - 36.0 g/dL   RDW 17.3 (H) 11.5 - 15.5 %   Platelets 71 (L) 150 - 400 K/uL    Comment: SPECIMEN CHECKED FOR CLOTS Immature Platelet Fraction may be clinically indicated, consider ordering this additional test [id number] REPEATED TO VERIFY PLATELET COUNT CONFIRMED BY SMEAR    nRBC 0.0 0.0 - 0.2 %   Neutrophils Relative % 40 %   Neutro Abs 1.6 (L) 1.7 - 7.7 K/uL   Lymphocytes Relative 28 %   Lymphs Abs 1.1 0.7 - 4.0 K/uL   Monocytes Relative 29 %   Monocytes Absolute 1.1 (H) 0.1 - 1.0 K/uL   Eosinophils Relative 2 %   Eosinophils Absolute 0.1 0.0 - 0.5 K/uL   Basophils Relative 0 %   Basophils Absolute 0.0 0.0 - 0.1 K/uL   WBC Morphology MORPHOLOGY UNREMARKABLE    RBC Morphology MORPHOLOGY UNREMARKABLE    Smear Review PLATELET COUNT CONFIRMED BY SMEAR    Immature Granulocytes 1 %   Abs Immature Granulocytes 0.05 0.00 - 0.07 K/uL    Comment: Performed at [facility], [address], [place], [place] [address]  Folate     Status: None   Collection Time: 09/20/21 11:56 AM  Result Value Ref Range   Folate 21.0 >5.9 ng/mL    Comment: Performed at [facility], [address] [place], [place] [address]  Vitamin B12     Status: Abnormal   Collection Time: 09/20/21 11:56 AM  Result Value Ref Range   Vitamin B-12 976 (H) 180 - 914 pg/mL    Comment: (NOTE) This assay is not validated for testing neonatal or myeloproliferative syndrome specimens for Vitamin B12 levels. Performed at [facility], [address], [place], [place] [address]   Lactate dehydrogenase     Status: None   Collection Time: 09/20/21 11:56 AM  Result Value Ref Range   LDH 131 98 - 192 U/L    Comment: Performed at [facility], [address], [place], [place] [address]  Reticulocytes     Status: None   Collection Time: 09/20/21 11:56 AM  Result Value Ref Range   Retic Ct Pct 0.9 0.4 - 3.1 %   RBC. 4.75 4.22 - 5.81 MIL/uL   Retic Count, Absolute 42.3 19.0 - 186.0 K/uL   Immature Retic Fract 10.2  2.3 - 15.9 %    Comment: Performed at [facility], [address] [place], [place] [address]    RADIOGRAPHIC STUDIES: I have personally reviewed the radiological images as listed and agreed with the findings in the report. No results found.  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  Last CBC on 09/18/2021 with PLT 72. - Has easy bruising but denies any bleeding.  No B symptoms.   Social/family history: - Walks with the help of cane/walker secondary to rheumatoid arthritis. - He worked as a Advertising copywriter pumps at service stations.  Exposure to gasoline and diesel present.  Quit smoking 45 years ago. - No family history of low platelets.  Sister died of metastatic breast cancer.  Daughter died of non-Hodgkin's lymphoma.  Mother had head and neck cancer.  3.  Rheumatoid arthritis: - Predominantly involves knees and ankles  for the past 4 to 5 years.  Seen by Dr. Lollie Sails at [facility]. - He has been on Enbrel and methotrexate for 2 to 3 years - Methotrexate dose decreased to 5 tablets weekly on 08/05/2021   PLAN:  Thrombocytopenia: - We discussed the differential diagnosis of thrombocytopenia.  Likely bone marrow suppression from methotrexate. - Recommend checking for nutritional deficiencies, infectious etiologies and bone marrow infiltrative process. - We will also check for flow cytometry for LGL leukemia which is seen in rheumatoid arthritis. - RTC 2 to 3 weeks to discuss results.   All questions were answered. The patient knows to call the clinic with any problems, questions or concerns.  Derek Jack, MD 09/20/21 3:14 PM  [place] [phone]   I, Thana Ates, am acting as a scribe for Dr. Derek Jack.  I, Derek Jack MD, have reviewed the above documentation for accuracy and completeness, and I agree with the above.

## 2021-09-20 NOTE — Patient Instructions (Addendum)
[facility] at [facility] Discharge Instructions  You were seen and examined today by Dr. Ellin Saba. Dr. Ellin Saba is a hematologist, meaning that he specializes in blood abnormalities. Dr. Ellin Saba discussed your past medical history, family history of cancers/blood conditions and the events that led to you being here today.  You were referred to Dr. Ellin Saba due to low platelets. Platelets are a blood cell that is responsible for clotting of the blood.  Dr. Ellin Saba has recommended additional labs today to recheck your platelets and additional labs in an attempt to identify the cause of your low platelets. It is possibly caused by medications, but we will rule out other causes.  Follow-up as scheduled.  Thank you for choosing [facility] at [facility] to provide your oncology and hematology care.  To afford each patient quality time with our provider, please arrive at least 15 minutes before your scheduled appointment time.   If you have a lab appointment with the Cancer Center please come in thru the Main Entrance and check in at the main information desk.  You need to re-schedule your appointment should you arrive 10 or more minutes late.  We strive to give you quality time with our providers, and arriving late affects you and other patients whose appointments are after yours.  Also, if you no show three or more times for appointments you may be dismissed from the clinic at the providers discretion.     Again, thank you for choosing [facility].  Our hope is that these requests will decrease the amount of time that you wait before being seen by our physicians.       _____________________________________________________________  Should you have questions after your visit to [facility], please contact our office at [phone] and follow the prompts.  Our office hours are 8:00 a.m. and 4:30 p.m. Monday - Friday.   Please note that voicemails left after 4:00 p.m. may not be returned until the following business day.  We are closed weekends and major holidays.  You do have access to a nurse 24-7, just call the main number to the clinic [phone] and do not press any options, hold on the line and a nurse will answer the phone.    For prescription refill requests, have your pharmacy contact our office and allow 72 hours.

## 2021-09-23 LAB — KAPPA/LAMBDA LIGHT CHAINS
Kappa free light chain: 32.7 mg/L — ABNORMAL HIGH (ref 3.3–19.4)
Kappa, lambda light chain ratio: 1.37 (ref 0.26–1.65)
Lambda free light chains: 23.9 mg/L (ref 5.7–26.3)

## 2021-09-23 LAB — METHYLMALONIC ACID, SERUM: Methylmalonic Acid, Quantitative: 150 nmol/L (ref 0–378)

## 2021-09-24 LAB — PROTEIN ELECTROPHORESIS, SERUM
A/G Ratio: 1.3 (ref 0.7–1.7)
Albumin ELP: 3.8 g/dL (ref 2.9–4.4)
Alpha-1-Globulin: 0.3 g/dL (ref 0.0–0.4)
Alpha-2-Globulin: 0.6 g/dL (ref 0.4–1.0)
Beta Globulin: 0.8 g/dL (ref 0.7–1.3)
Gamma Globulin: 1.2 g/dL (ref 0.4–1.8)
Globulin, Total: 2.9 g/dL (ref 2.2–3.9)
Total Protein ELP: 6.7 g/dL (ref 6.0–8.5)

## 2021-09-24 LAB — COPPER, SERUM: Copper: 107 ug/dL (ref 69–132)

## 2021-09-24 LAB — SURGICAL PATHOLOGY

## 2021-09-30 LAB — FLOW CYTOMETRY

## 2021-10-09 NOTE — Progress Notes (Unsigned)
Austin Cox, [place] [address]   CLINIC:  Medical Oncology/Hematology  PCP:  Baruch Gouty, [place] [address] [phone]   REASON FOR VISIT:  Follow-up for thrombocytopenia  PRIOR THERAPY: None  CURRENT THERAPY: Under work-up  INTERVAL HISTORY:  [name] 76 y.o. male returns for routine follow-up of his thrombocytopenia.  He was seen for initial consultation by Dr. Delton Coombes on 09/20/2021.  At today's visit, he reports feeling fairly well.  He denies any changes in his health status since his initial visit with Dr. Delton Coombes to weeks ago.  No recent hospitalizations, surgeries, or changes in baseline health status.  He denies bleeding issues, and he reports easy bruising. He takes 81 MG aspirin. He denies fevers, night sweats, and weight loss. He walk with a cane or walker due to RA affecting his knees and ankles; he has had RA for 5 years. He has been on Enbrel and Methotrexate for 2-3 years. He denies taking quinine supplements.   He has 75% energy and 100% appetite. He endorses that he is maintaining a stable weight.   REVIEW OF SYSTEMS:    Review of Systems  Constitutional:  Positive for fatigue. Negative for appetite change, chills, diaphoresis, fever and unexpected weight change.  HENT:   Negative for lump/mass and nosebleeds.   Eyes:  Negative for eye problems.  Respiratory:  Negative for cough, hemoptysis and shortness of breath.   Cardiovascular:  Negative for chest pain, leg swelling and palpitations.  Gastrointestinal:  Negative for abdominal pain, blood in stool, constipation, diarrhea, nausea and vomiting.  Genitourinary:  Positive for frequency and nocturia. Negative for hematuria.   Musculoskeletal:  Positive for arthralgias.  Skin: Negative.   Neurological:  Negative for dizziness, headaches and light-headedness.  Hematological:  Does not bruise/bleed easily.      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   Past Surgical History:  Procedure Laterality Date   CATARACT EXTRACTION     ENDARTERECTOMY Left 04/15/2021   Procedure: LEFT CAROTID ENDARTERECTOMY WITH;  Surgeon: Marty Heck, MD;  Location: [facility] OR;  Service: Vascular;  Laterality: Left;   EYE SURGERY     detached retina right eye   FRACTURE SURGERY     right leg   Stent in heart       SOCIAL HISTORY:  Social History   Socioeconomic History   Marital status: Married    Spouse name: Not on file   Number of children: 3   Years of education: Not on file   Highest education level: Not on file  Occupational History   Occupation: retired  Tobacco Use   Smoking status: Former    Packs/day: 0.20    Years: 10.00    Total pack years: 2.00    Types: Cigarettes    Quit date: 07/06/1974    Years since quitting: 47.2   Smokeless tobacco: Never  Vaping Use   Vaping Use: Never used  Substance and Sexual Activity   Alcohol use: Not Currently   Drug use: Never   Sexual activity: Not on file  Other Topics Concern   Not on file  Social History Narrative   3 children - one passed away  Son in [place]   They raised 2 teenage grandchildren after their daughter and son in law both passed away    Social Determinants of Health   Financial Resource Strain: Medium Risk (04/05/2021)   Overall Financial Resource Strain (CARDIA)    Difficulty of Paying Living Expenses: Somewhat hard  Food Insecurity: Food Insecurity Present (04/05/2021)   Hunger Vital Sign    Worried About Running Out of Food in the Last Year: Often true    Ran Out of Food in the Last Year: Sometimes true  Transportation Needs: No Transportation Needs (04/05/2021)   PRAPARE - Hydrologist (Medical): No    Lack of  Transportation (Non-Medical): No  Physical Activity: Insufficiently Active (04/05/2021)   Exercise Vital Sign    Days of Exercise per Week: 6 days    Minutes of Exercise per Session: 20 min  Stress: No Stress Concern Present (04/05/2021)   [place]    Feeling of Stress : Not at all  Social Connections: Moderately Isolated (04/05/2021)   Social Connection and Isolation Panel [NHANES]    Frequency of Communication with Friends and Family: More than three times a week    Frequency of Social Gatherings with Friends and Family: Once a week    Attends Religious Services: Never    Marine scientist or Organizations: No    Attends Archivist Meetings: Never    Marital Status: Married  Human resources officer Violence: Not At Risk (04/05/2021)   Humiliation, Afraid, Rape, and Kick questionnaire    Fear of Current or Ex-Partner: No    Emotionally Abused: No    Physically Abused: No    Sexually Abused: No    FAMILY HISTORY:  No family history on file.  CURRENT MEDICATIONS:  Outpatient Encounter Medications as of 10/10/2021  Medication Sig Note   acetaminophen (TYLENOL) 650 MG CR tablet Take 650 mg by mouth every 8 (eight) hours as needed for pain.    aspirin 81 MG EC tablet Take 81 mg by mouth daily.    Cholecalciferol 25 MCG (1000 UT) capsule Take 1 capsule (1,000 Units total) by mouth daily.    diclofenac Sodium (VOLTAREN) 1 % GEL Apply 1 application  topically 4 (four) times daily as needed (pain).    etanercept (ENBREL SURECLICK) 50 MG/ML injection Inject 50 mg into the skin every Friday.    folic acid (FOLVITE) 1 MG tablet Take 1 mg by mouth daily.    gabapentin (NEURONTIN) 300 MG capsule Take 300 mg by mouth 2 (two) times daily.    HYDROcodone-acetaminophen (NORCO) 5-325 MG tablet Take 1 tablet by mouth every 6 (six) hours as needed for moderate pain.    methotrexate (RHEUMATREX) 2.5 MG tablet Take 5 mg by mouth  every Tuesday.    metoprolol succinate (TOPROL-XL) 25 MG 24 hr tablet Take 1 tablet (25 mg total) by mouth daily.    nitroGLYCERIN (NITROSTAT) 0.3 MG SL tablet PLACE 1 TABLET UNDER THE TONGUE EVERY 5 MINS AS NEEDED FOR CHEST PAIN (Patient not taking: Reported on 09/20/2021)    rosuvastatin (CRESTOR) 40 MG tablet Take 1 tablet (40 mg total) by mouth daily. 04/12/2021: Takes at bedtime   Tetrahydrozoline HCl (VISINE OP) Place 1 drop into both eyes daily as needed (dry eyes).    vitamin B-12 (CYANOCOBALAMIN) 1000 MCG tablet Take 1,000 mcg by mouth daily.    No facility-administered encounter medications on file as of 10/10/2021.  ALLERGIES:  No Known Allergies   PHYSICAL EXAM:    ECOG PERFORMANCE STATUS: 2 - Symptomatic, <50% confined to bed  There were no vitals filed for this visit. There were no vitals filed for this visit. Physical Exam Constitutional:      Appearance: Normal appearance. He is obese.  HENT:     Head: Normocephalic and atraumatic.     Mouth/Throat:     Mouth: Mucous membranes are moist.  Eyes:     Extraocular Movements: Extraocular movements intact.     Pupils: Pupils are equal, round, and reactive to light.  Cardiovascular:     Rate and Rhythm: Normal rate and regular rhythm.     Pulses: Normal pulses.     Heart sounds: Normal heart sounds.  Pulmonary:     Effort: Pulmonary effort is normal.     Breath sounds: Normal breath sounds. Decreased air movement present.  Abdominal:     General: Bowel sounds are normal.     Palpations: Abdomen is soft.     Tenderness: There is no abdominal tenderness.  Musculoskeletal:        General: No swelling.     Right lower leg: No edema.     Left lower leg: No edema.  Lymphadenopathy:     Cervical: No cervical adenopathy.  Skin:    General: Skin is warm and dry.  Neurological:     General: No focal deficit present.     Mental Status: He is alert and oriented to person, place, and time.  Psychiatric:        Mood and  Affect: Mood normal.        Behavior: Behavior normal.      LABORATORY DATA:  I have reviewed the labs as listed.  CBC    Component Value Date/Time   WBC 3.9 (L) 09/20/2021 1156   RBC 4.80 09/20/2021 1156   RBC 4.75 09/20/2021 1156   HGB 12.9 (L) 09/20/2021 1156   HGB 13.0 09/18/2021 0826   HCT 40.5 09/20/2021 1156   HCT 39.3 09/18/2021 0826   PLT 71 (L) 09/20/2021 1156   PLT 72 (LL) 09/18/2021 0826   MCV 84.4 09/20/2021 1156   MCV 82 09/18/2021 0826   MCH 26.9 09/20/2021 1156   MCHC 31.9 09/20/2021 1156   RDW 17.3 (H) 09/20/2021 1156   RDW 16.3 (H) 09/18/2021 0826   LYMPHSABS 1.1 09/20/2021 1156   LYMPHSABS 1.0 09/18/2021 0826   MONOABS 1.1 (H) 09/20/2021 1156   EOSABS 0.1 09/20/2021 1156   EOSABS 0.0 09/18/2021 0826   BASOSABS 0.0 09/20/2021 1156   BASOSABS 0.0 09/18/2021 0826      Latest Ref Rng & Units 09/18/2021    8:26 AM 04/16/2021    5:00 AM 03/19/2021   11:49 AM  CMP  Glucose 70 - 99 mg/dL 562  130  95   BUN 8 - 27 mg/dL 12  13  9    Creatinine 0.76 - 1.27 mg/dL  8.65  7.84   Sodium 134 - 144 mmol/L 141  136  141   Potassium 3.5 - 5.2 mmol/L 3.7  4.1  4.3   Chloride 96 - 106 mmol/L 102  107  102   CO2 20 - 29 mmol/L 24  23  25    Calcium 8.6 - 10.2 mg/dL 6.96  9.0  9.3   Total Protein 6.0 - 8.5 g/dL 6.4   6.3   Total Bilirubin 0.0 - 1.2 mg/dL 0.8   0.6   Alkaline  Phos 44 - 121 IU/L 73   70   AST 0 - 40 IU/L 17   13   ALT 0 - 44 IU/L 19   9     DIAGNOSTIC IMAGING:  I have independently reviewed the relevant imaging and discussed with the patient.  ASSESSMENT & PLAN: 1.  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  Limited data prior to 2016 with normal platelets noted in 2009. - For the most part, thrombocytopenia from 2016-2022 has been mild, with platelets 100-150. - Downtrending platelets since May 2022 with platelets ranging  between 70-90 - Diagnosed with rheumatoid arthritis around 2018 - Has been on methotrexate since about 2019, recently had methotrexate dose decreased to 5 tablets weekly on 08/05/2021.  Reduced to 4 tablets weekly as of 10/03/2021. - He has been on Enbrel since about 2020 - Hematology work-up (09/20/2021): Flow cytometry: No monoclonal B-cell population or significant T-cell abnormalities identified.  (Negative for LGL leukemia, which can be seen in rheumatoid arthritis) Negative hepatitis C and hepatitis B Normal copper, folate, B12, methylmalonic acid SPEP negative.  Mildly elevated kappa free light chain 32.7 with normal lambda free light chain and light chain ratio.  Normal LDH. Reticulocyte 0.9% - Most recent CBC (09/20/2021): Platelets 71, WBC 3.9/ANC 1.6, Hgb 12.9/MCV 84.4 - Has easy bruising but denies any bleeding.  No B symptoms.   - Abdominal [facility] (08/13/2017 at [facility]): Hepatomegaly and hepatic steatosis with normal size spleen - We discussed the differential diagnosis of moderate thrombocytopenia with mild pancytopenia.  Likely bone marrow suppression from methotrexate.  Differential diagnosis would also include immune mediated thrombocytopenia or early signs of MDS. - Recommend checking for nutritional deficiencies, infectious etiologies and bone marrow infiltrative process. - PLAN: We will check abdominal ultrasound to rule out any occult splenomegaly or progression of fatty liver disease.  Will call with results.  - No urgent indication for treatment at this time.  We will continue to monitor closely with repeat labs and follow-up in 4 months.   -- Recently had dose of methotrexate reduced, but may still have some prolonged myelosuppressive effects.  If persistent or worsening thrombocytopenia at follow up in 4 months, we will reach out to rheumatology to discuss further dose reduction of MTX   2.  Rheumatoid arthritis - Predominantly involves knees and ankles for the past  4 to 5 years.  Seen by Dr. Lollie Sails at [facility]. - He has been on Enbrel and methotrexate for 2 to 3 years - Methotrexate dose decreased to 4 tablets weekly on 10/03/2021  3.  Other history - PMH: Rheumatoid arthritis, hypertension, hyperlipidemia, carotid artery stenosis, small vessel cerebrovascular disease - Walks with the help of cane/walker secondary to rheumatoid arthritis. - He worked as a Advertising copywriter pumps at service stations.  Exposure to gasoline and diesel present.  Quit smoking 45 years ago. - No family history of low platelets.  Sister died of metastatic breast cancer.  Daughter died of non-Hodgkin's lymphoma.  Mother had head and neck cancer.   All questions were answered. The patient knows to call the clinic with any problems, questions or concerns.  Medical decision making: Moderate  Time spent on visit: I spent 20 minutes counseling the patient face to face. The total time spent in the appointment was 30 minutes and more than 50% was on counseling.   Harriett Rush, PA-C  10/10/21 9:16 AM

## 2021-10-10 ENCOUNTER — Encounter (HOSPITAL_COMMUNITY): Payer: Self-pay | Admitting: Physician Assistant

## 2021-10-10 ENCOUNTER — Inpatient Hospital Stay (HOSPITAL_BASED_OUTPATIENT_CLINIC_OR_DEPARTMENT_OTHER): Payer: Medicare Other | Admitting: Physician Assistant

## 2021-10-10 VITALS — BP 123/93 | HR 75 | Temp 98.7°F | Resp 16 | Wt 204.6 lb

## 2021-10-10 DIAGNOSIS — Z808 Family history of malignant neoplasm of other organs or systems: Secondary | ICD-10-CM | POA: Diagnosis not present

## 2021-10-10 DIAGNOSIS — D696 Thrombocytopenia, unspecified: Secondary | ICD-10-CM | POA: Diagnosis not present

## 2021-10-10 DIAGNOSIS — Z807 Family history of other malignant neoplasms of lymphoid, hematopoietic and related tissues: Secondary | ICD-10-CM | POA: Diagnosis not present

## 2021-10-10 DIAGNOSIS — Z79899 Other long term (current) drug therapy: Secondary | ICD-10-CM | POA: Diagnosis not present

## 2021-10-10 DIAGNOSIS — Z87891 Personal history of nicotine dependence: Secondary | ICD-10-CM | POA: Diagnosis not present

## 2021-10-10 DIAGNOSIS — M069 Rheumatoid arthritis, unspecified: Secondary | ICD-10-CM | POA: Diagnosis not present

## 2021-10-10 DIAGNOSIS — Z803 Family history of malignant neoplasm of breast: Secondary | ICD-10-CM | POA: Diagnosis not present

## 2021-10-10 NOTE — Patient Instructions (Signed)
[facility] at [facility] Discharge Instructions  You were seen today by Rojelio Brenner PA-C for your low platelets.  Your low platelets are most likely due to your methotrexate, which is why your rheumatologist has decreased the dose of your methotrexate.  You may also have some immune system dysfunction where your immune system is attacking your platelets.  We would also like to check ABDOMINAL ULTRASOUND to see if you have any liver or spleen abnormalities that could cause low platelets.  We will check your labs and see you for follow-up visit again in 4 months.   - - - - - - - - - - - - - - - - - -    Thank you for choosing [facility] at [facility] to provide your oncology and hematology care.  To afford each patient quality time with our provider, please arrive at least 15 minutes before your scheduled appointment time.   If you have a lab appointment with the Cancer Center please come in thru the Main Entrance and check in at the main information desk.  You need to re-schedule your appointment should you arrive 10 or more minutes late.  We strive to give you quality time with our providers, and arriving late affects you and other patients whose appointments are after yours.  Also, if you no show three or more times for appointments you may be dismissed from the clinic at the providers discretion.     Again, thank you for choosing [facility]  Our hope is that these requests will decrease the amount of time that you wait before being seen by our physicians.       _____________________________________________________________  Should you have questions after your visit to [facility], please contact our office at [phone] and follow the prompts.  Our office hours are 8:00 a.m. and 4:30 p.m. Monday - Friday.  Please note that voicemails left after 4:00 p.m. may not be returned until the following business  day.  We are closed weekends and major holidays.  You do have access to a nurse 24-7, just call the main number to the clinic [phone] and do not press any options, hold on the line and a nurse will answer the phone.    For prescription refill requests, have your pharmacy contact our office and allow 72 hours.    Due to Covid, you will need to wear a mask upon entering the hospital. If you do not have a mask, a mask will be given to you at the Main Entrance upon arrival. For doctor visits, patients may have 1 support person age 59 or older with them. For treatment visits, patients can not have anyone with them due to social distancing guidelines and our immunocompromised population.

## 2021-10-14 ENCOUNTER — Other Ambulatory Visit: Payer: Self-pay | Admitting: Family Medicine

## 2021-10-14 DIAGNOSIS — N401 Enlarged prostate with lower urinary tract symptoms: Secondary | ICD-10-CM

## 2021-10-23 ENCOUNTER — Ambulatory Visit (HOSPITAL_COMMUNITY)
Admission: RE | Admit: 2021-10-23 | Discharge: 2021-10-23 | Disposition: A | Discharge status: Home / Self Care | Payer: Medicare Other | Source: Ambulatory Visit | Attending: Physician Assistant | Admitting: Physician Assistant

## 2021-10-23 DIAGNOSIS — D696 Thrombocytopenia, unspecified: Secondary | ICD-10-CM | POA: Diagnosis not present

## 2021-10-23 DIAGNOSIS — I714 Abdominal aortic aneurysm, without rupture, unspecified: Secondary | ICD-10-CM | POA: Diagnosis not present

## 2021-10-23 DIAGNOSIS — R161 Splenomegaly, not elsewhere classified: Secondary | ICD-10-CM | POA: Diagnosis not present

## 2021-11-11 ENCOUNTER — Other Ambulatory Visit: Payer: Self-pay | Admitting: Family Medicine

## 2021-11-11 DIAGNOSIS — E7800 Pure hypercholesterolemia, unspecified: Secondary | ICD-10-CM

## 2021-11-11 DIAGNOSIS — I10 Essential (primary) hypertension: Secondary | ICD-10-CM

## 2021-11-11 DIAGNOSIS — I2510 Atherosclerotic heart disease of native coronary artery without angina pectoris: Secondary | ICD-10-CM

## 2021-11-13 NOTE — Progress Notes (Unsigned)
[place] [place], [place] [address]   CLINIC:  Medical Oncology/Hematology  PCP:  Baruch Gouty, [address] [place] [id number] [phone]   REASON FOR VISIT:  Follow-up for thrombocytopenia  PRIOR THERAPY: None   CURRENT THERAPY: Under work-up  INTERVAL HISTORY:  Mr. Beach 76 y.o. male returns for routine follow-up of thrombocytopenia.  He was last seen by Tarri Abernethy PA-C on 10/10/2021 and returns today to discuss results from that work-up.  At today's visit, he reports feeling fairly well.  No recent hospitalizations, surgeries, or changes in baseline health status.  He denies any B symptoms.  Chronic fatigue is at baseline.  He has easy bruising, takes aspirin 81 mg daily.  He denies any major bleeding events, recurrent infections, masses, or lymphadenopathy.  He has 50% energy and 100% appetite. He endorses that he is maintaining a stable weight.    REVIEW OF SYSTEMS:    Review of Systems  Constitutional:  Positive for fatigue. Negative for appetite change, chills, diaphoresis, fever and unexpected weight change.  HENT:   Negative for lump/mass and nosebleeds.   Eyes:  Negative for eye problems.  Respiratory:  Negative for cough, hemoptysis and shortness of breath.   Cardiovascular:  Negative for chest pain, leg swelling and palpitations.  Gastrointestinal:  Negative for abdominal pain, blood in stool, constipation, diarrhea, nausea and vomiting.  Genitourinary:  Negative for hematuria.   Skin: Negative.   Neurological:  Negative for dizziness, headaches and light-headedness.  Hematological:  Does not bruise/bleed easily.      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   Past Surgical History:  Procedure Laterality  Date   CATARACT EXTRACTION     ENDARTERECTOMY Left 04/15/2021   Procedure: LEFT CAROTID ENDARTERECTOMY WITH;  Surgeon: Marty Heck, MD;  Location: [facility] OR;  Service: Vascular;  Laterality: Left;   EYE SURGERY     detached retina right eye   FRACTURE SURGERY     right leg   Stent in heart       SOCIAL HISTORY:  Social History   Socioeconomic History   Marital status: Married    Spouse name: Not on file   Number of children: 3   Years of education: Not on file   Highest education level: Not on file  Occupational History   Occupation: retired  Tobacco Use   Smoking status: Former    Packs/day: 0.20    Years: 10.00    Total pack years: 2.00    Types: Cigarettes    Quit date: 07/06/1974    Years since quitting: 47.3   Smokeless tobacco: Never  Vaping Use   Vaping Use: Never used  Substance and Sexual Activity   Alcohol use: Not Currently   Drug use: Never   Sexual activity: Not on file  Other Topics Concern   Not on file  Social History Narrative   3 children - one passed away   Son in [place]   They raised 2 teenage grandchildren after their daughter and son in law both passed away    Social Determinants of Health   Financial Resource Strain: Medium Risk (04/05/2021)   Overall Financial Resource Strain (CARDIA)    Difficulty of  Paying Living Expenses: Somewhat hard  Food Insecurity: Food Insecurity Present (04/05/2021)   Hunger Vital Sign    Worried About Running Out of Food in the Last Year: Often true    Ran Out of Food in the Last Year: Sometimes true  Transportation Needs: No Transportation Needs (04/05/2021)   PRAPARE - Hydrologist (Medical): No    Lack of Transportation (Non-Medical): No  Physical Activity: Insufficiently Active (04/05/2021)   Exercise Vital Sign    Days of Exercise per Week: 6 days    Minutes of Exercise per Session: 20 min  Stress: No Stress Concern Present (04/05/2021)   [place]    Feeling of Stress : Not at all  Social Connections: Moderately Isolated (04/05/2021)   Social Connection and Isolation Panel [NHANES]    Frequency of Communication with Friends and Family: More than three times a week    Frequency of Social Gatherings with Friends and Family: Once a week    Attends Religious Services: Never    Marine scientist or Organizations: No    Attends Archivist Meetings: Never    Marital Status: Married  Human resources officer Violence: Not At Risk (04/05/2021)   Humiliation, Afraid, Rape, and Kick questionnaire    Fear of Current or Ex-Partner: No    Emotionally Abused: No    Physically Abused: No    Sexually Abused: No    FAMILY HISTORY:  No family history on file.  CURRENT MEDICATIONS:  Outpatient Encounter Medications as of 11/14/2021  Medication Sig   acetaminophen (TYLENOL) 650 MG CR tablet Take 650 mg by mouth every 8 (eight) hours as needed for pain.   aspirin 81 MG EC tablet Take 81 mg by mouth daily.   baclofen (LIORESAL) 10 MG tablet Take 10 mg by mouth 2 (two) times daily.   Cholecalciferol 25 MCG (1000 UT) capsule Take 1 capsule (1,000 Units total) by mouth daily.   diclofenac Sodium (VOLTAREN) 1 % GEL Apply 1 application  topically 4 (four) times daily as needed (pain).   etanercept (ENBREL SURECLICK) 50 MG/ML injection Inject 50 mg into the skin every Friday.   folic acid (FOLVITE) 1 MG tablet Take 1 mg by mouth daily.   gabapentin (NEURONTIN) 300 MG capsule Take 300 mg by mouth 2 (two) times daily.   HYDROcodone-acetaminophen (NORCO) 5-325 MG tablet Take 1 tablet by mouth every 6 (six) hours as needed for moderate pain.   methotrexate (RHEUMATREX) 2.5 MG tablet Take 5 mg by mouth every Tuesday.   metoprolol succinate (TOPROL-XL) 25 MG 24 hr tablet TAKE 1 TABLET BY MOUTH DAILY   nitroGLYCERIN (NITROSTAT) 0.3 MG SL tablet    rosuvastatin (CRESTOR) 40 MG tablet TAKE 1 TABLET BY  MOUTH DAILY   Tetrahydrozoline HCl (VISINE OP) Place 1 drop into both eyes daily as needed (dry eyes).   vitamin B-12 (CYANOCOBALAMIN) 1000 MCG tablet Take 1,000 mcg by mouth daily.   No facility-administered encounter medications on file as of 11/14/2021.    ALLERGIES:  No Known Allergies   PHYSICAL EXAM:    ECOG PERFORMANCE STATUS: 2 - Symptomatic, <50% confined to bed  There were no vitals filed for this visit. There were no vitals filed for this visit. Physical Exam Constitutional:      Appearance: Normal appearance. He is obese.  HENT:     Head: Normocephalic and atraumatic.     Mouth/Throat:  Mouth: Mucous membranes are moist.  Eyes:     Extraocular Movements: Extraocular movements intact.     Pupils: Pupils are equal, round, and reactive to light.  Cardiovascular:     Rate and Rhythm: Normal rate and regular rhythm.     Pulses: Normal pulses.     Heart sounds: Normal heart sounds.  Pulmonary:     Effort: Pulmonary effort is normal.     Breath sounds: Normal breath sounds. Decreased air movement present.  Abdominal:     General: Bowel sounds are normal.     Palpations: Abdomen is soft.     Tenderness: There is no abdominal tenderness.  Musculoskeletal:        General: No swelling.     Right lower leg: No edema.     Left lower leg: No edema.  Lymphadenopathy:     Cervical: No cervical adenopathy.  Skin:    General: Skin is warm and dry.  Neurological:     General: No focal deficit present.     Mental Status: He is alert and oriented to person, place, and time.  Psychiatric:        Mood and Affect: Mood normal.        Behavior: Behavior normal.      LABORATORY DATA:  I have reviewed the labs as listed.  CBC    Component Value Date/Time   WBC 3.9 (L) 09/20/2021 1156   RBC 4.80 09/20/2021 1156   RBC 4.75 09/20/2021 1156   HGB 12.9 (L) 09/20/2021 1156   HGB 13.0 09/18/2021 0826   HCT 40.5 09/20/2021 1156   HCT 39.3 09/18/2021 0826   PLT 71 (L)  09/20/2021 1156   PLT 72 (LL) 09/18/2021 0826   MCV 84.4 09/20/2021 1156   MCV 82 09/18/2021 0826   MCH 26.9 09/20/2021 1156   MCHC 31.9 09/20/2021 1156   RDW 17.3 (H) 09/20/2021 1156   RDW 16.3 (H) 09/18/2021 0826   LYMPHSABS 1.1 09/20/2021 1156   LYMPHSABS 1.0 09/18/2021 0826   MONOABS 1.1 (H) 09/20/2021 1156   EOSABS 0.1 09/20/2021 1156   EOSABS 0.0 09/18/2021 0826   BASOSABS 0.0 09/20/2021 1156   BASOSABS 0.0 09/18/2021 0826      Latest Ref Rng & Units 09/18/2021    8:26 AM 04/16/2021    5:00 AM 03/19/2021   11:49 AM  CMP  Glucose 70 - 99 mg/dL 194  100  95   BUN 8 - 27 mg/dL $Remove'12  13  9   'kTFbjiW$ Creatinine 0.76 - 1.27 mg/dL 1.11  1.00  1.04   Sodium 134 - 144 mmol/L 141  136  141   Potassium 3.5 - 5.2 mmol/L 3.7  4.1  4.3   Chloride 96 - 106 mmol/L 102  107  102   CO2 20 - 29 mmol/L $RemoveB'24  23  25   'toapeaUi$ Calcium 8.6 - 10.2 mg/dL 10.1  9.0  9.3   Total Protein 6.0 - 8.5 g/dL 6.4   6.3   Total Bilirubin 0.0 - 1.2 mg/dL 0.8   0.6   Alkaline Phos 44 - 121 IU/L 73   70   AST 0 - 40 IU/L 17   13   ALT 0 - 44 IU/L 19   9     DIAGNOSTIC IMAGING:  I have independently reviewed the relevant imaging and discussed with the patient.  ASSESSMENT & PLAN: 1.  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  Limited data prior to 2016 with normal platelets noted in 2009. - For the most part, thrombocytopenia from 2016-2022 has been mild, with platelets 100-150. - Downtrending platelets since May 2022 with platelets ranging between 70-90 - Diagnosed with rheumatoid arthritis around 2018 - Has been on methotrexate since about 2019, recently had methotrexate dose decreased to 5 tablets weekly on 08/05/2021.  Reduced to 4 tablets weekly as of 10/03/2021. - He has been on Enbrel since about 2020 - Hematology work-up (09/20/2021): Flow cytometry: No monoclonal B-cell population or significant T-cell  abnormalities identified.  (Negative for LGL leukemia, which can be seen in rheumatoid arthritis) Negative hepatitis C and hepatitis B Normal copper, folate, B12, methylmalonic acid SPEP negative.  Mildly elevated kappa free light chain 32.7 with normal lambda free light chain and light chain ratio.  Normal LDH. Reticulocyte 0.9% - Abdominal ultrasound (10/23/2021): Splenomegaly measuring 14.9 cm diameter, normal-appearing liver. - Most recent CBC (09/20/2021): Platelets 71, WBC 3.9/ANC 1.6, Hgb 12.9/MCV 84.4 - Reports easy bruising.  Denies any bleeding, B symptoms, recurrent infections, or lymphadenopathy. - Differential diagnosis includes bone marrow infiltrative process (lymphoma), Felty's syndrome, myelosuppression from methotrexate, ITP, or early MDS. - PLAN: Due to pancytopenia with unexplained splenomegaly, we will check PET scan. - RTC with same-day labs after PET scan to discuss results.  If positive PET scan, would consider bone marrow biopsy.   2.  INCIDENTAL FINDING: Abdominal aortic aneurysm   - Abdominal ultrasound (10/23/2021) with incidental finding of aneurysmal dilation of abdominal aorta with proximal abdominal aorta measuring 5.1 cm in diameter - PLAN: Patient informed of results.  Referral sent to vascular surgery.  - Education provided on risks and acute presentation of aneurysmal rupture  3.  Rheumatoid arthritis - Predominantly involves knees and ankles for the past 4 to 5 years.  Seen by Dr. Lollie Sails at [facility]. - He has been on Enbrel and methotrexate for 2 to 3 years - Methotrexate dose decreased to 4 tablets weekly on 10/03/2021   4.  Other history - PMH: Rheumatoid arthritis, hypertension, hyperlipidemia, carotid artery stenosis, small vessel cerebrovascular disease - Walks with the help of cane/walker secondary to rheumatoid arthritis. - He worked as a Advertising copywriter pumps at service stations.  Exposure to gasoline and diesel present.  Quit smoking 45  years ago. - No family history of low platelets.  Sister died of metastatic breast cancer.  Daughter died of non-Hodgkin's lymphoma.  Mother had head and neck cancer.    All questions were answered. The patient knows to call the clinic with any problems, questions or concerns.  Medical decision making: Moderate  Time spent on visit: I spent 20 minutes counseling the patient face to face. The total time spent in the appointment was 30 minutes and more than 50% was on counseling.   Harriett Rush, PA-C  11/14/2021 9:57 AM

## 2021-11-14 ENCOUNTER — Encounter: Payer: Self-pay | Admitting: Family Medicine

## 2021-11-14 ENCOUNTER — Inpatient Hospital Stay: Payer: Medicare Other | Attending: Physician Assistant | Admitting: Physician Assistant

## 2021-11-14 VITALS — BP 113/75 | HR 75 | Temp 97.6°F | Resp 18 | Wt 208.1 lb

## 2021-11-14 DIAGNOSIS — Z87891 Personal history of nicotine dependence: Secondary | ICD-10-CM | POA: Insufficient documentation

## 2021-11-14 DIAGNOSIS — Z79899 Other long term (current) drug therapy: Secondary | ICD-10-CM | POA: Insufficient documentation

## 2021-11-14 DIAGNOSIS — I714 Abdominal aortic aneurysm, without rupture, unspecified: Secondary | ICD-10-CM | POA: Diagnosis not present

## 2021-11-14 DIAGNOSIS — D61818 Other pancytopenia: Secondary | ICD-10-CM

## 2021-11-14 DIAGNOSIS — D696 Thrombocytopenia, unspecified: Secondary | ICD-10-CM | POA: Diagnosis not present

## 2021-11-14 DIAGNOSIS — Z7982 Long term (current) use of aspirin: Secondary | ICD-10-CM | POA: Insufficient documentation

## 2021-11-14 DIAGNOSIS — R161 Splenomegaly, not elsewhere classified: Secondary | ICD-10-CM

## 2021-11-14 NOTE — Patient Instructions (Signed)
[facility] at [facility] **VISIT SUMMARY & IMPORTANT INSTRUCTIONS **   You were seen today by Rojelio Brenner PA-C for your follow-up visit to discuss results of your abdominal ultrasound.    ENLARGED SPLEEN Due to your enlarged spleen and low blood counts, we will check a PET scan to help determine if these problems are coming from a cancerous cause or a benign cause.  ABDOMINAL AORTIC ANEURYSM As we discussed, this bulge in the main blood vessel in your body places you at risk for aortic rupture which can lead to life-threatening bleeding. We will send referral to your vascular surgeon (Dr. Chestine Spore) for further follow-up and treatment.  UPCOMING TESTS: PET scan  MEDICATIONS: No changes to home medications  FOLLOW-UP APPOINTMENT: Same-day labs and office visit after PET scan (in about 1 month)  ** Thank you for trusting me with your healthcare!  I strive to provide all of my patients with quality care at each visit.  If you receive a survey for this visit, I would be so grateful to you for taking the time to provide feedback.  Thank you in advance!  ~ Avionna Bower                   Dr. Doreatha Massed   &   Rojelio Brenner, PA-C   - - - - - - - - - - - - - - - - - -    Thank you for choosing [facility] at [facility] to provide your oncology and hematology care.  To afford each patient quality time with our provider, please arrive at least 15 minutes before your scheduled appointment time.   If you have a lab appointment with the Cancer Center please come in thru the Main Entrance and check in at the main information desk.  You need to re-schedule your appointment should you arrive 10 or more minutes late.  We strive to give you quality time with our providers, and arriving late affects you and other patients whose appointments are after yours.  Also, if you no show three or more times for appointments you may be dismissed from the  clinic at the providers discretion.     Again, thank you for choosing [facility].  Our hope is that these requests will decrease the amount of time that you wait before being seen by our physicians.       _____________________________________________________________  Should you have questions after your visit to [facility], please contact our office at [phone] and follow the prompts.  Our office hours are 8:00 a.m. and 4:30 p.m. Monday - Friday.  Please note that voicemails left after 4:00 p.m. may not be returned until the following business day.  We are closed weekends and major holidays.  You do have access to a nurse 24-7, just call the main number to the clinic [phone] and do not press any options, hold on the line and a nurse will answer the phone.    For prescription refill requests, have your pharmacy contact our office and allow 72 hours.

## 2021-11-19 ENCOUNTER — Encounter: Payer: Self-pay | Admitting: Vascular Surgery

## 2021-11-19 ENCOUNTER — Ambulatory Visit (INDEPENDENT_AMBULATORY_CARE_PROVIDER_SITE_OTHER): Payer: Medicare Other | Admitting: Vascular Surgery

## 2021-11-19 VITALS — BP 117/77 | HR 82 | Temp 97.9°F | Resp 18 | Ht 72.0 in | Wt 206.0 lb

## 2021-11-19 DIAGNOSIS — I7143 Infrarenal abdominal aortic aneurysm, without rupture: Secondary | ICD-10-CM | POA: Diagnosis not present

## 2021-11-19 NOTE — Progress Notes (Signed)
Patient name: Austin Cox MRN: [id number] DOB: 1945-12-12 Sex: male  REASON FOR VISIT: Evaluate AAA  HPI: Austin Cox is a 76 y.o. male with history of coronary artery disease status post PCI, rheumatoid arthritis, carotid stenosis presents for evaluation of abdominal aortic aneurysm.  Patient got a ultrasound on 10/23/2021 showing a 5.1 cm abdominal aortic aneurysm ordered by Rojelio Brenner.  States he had no prior knowledge of the aneurysm.  Patient has chronic back pain but otherwise no new symptoms including abdominal pain.  Is well-known to our practice.  He had a left carotid endarterectomy on 04/15/2021 for an asymptomatic high-grade stenosis with string sign.  Postop course was complicated by a hematoma that was managed with observation.  He does not smoke and quit in the 1980's.  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 No pertinent family history.  SOCIAL HISTORY: Social History   Tobacco Use   Smoking status: Former    Packs/day: 0.20    Years: 10.00    Total pack years: 2.00    Types: Cigarettes    Quit date: 07/06/1974    Years since quitting: 47.4   Smokeless tobacco: Never  Substance Use Topics   Alcohol use: Not Currently    No Known Allergies  Current Outpatient Medications  Medication Sig Dispense Refill   acetaminophen (TYLENOL) 650 MG CR tablet Take 650 mg by mouth every 8 (eight) hours as needed for pain.     aspirin  81 MG EC tablet Take 81 mg by mouth daily.     baclofen (LIORESAL) 10 MG tablet Take 10 mg by mouth 2 (two) times daily.     Cholecalciferol 25 MCG (1000 UT) capsule Take 1 capsule (1,000 Units total) by mouth daily. 90 capsule 2   diclofenac Sodium (VOLTAREN) 1 % GEL Apply 1 application  topically 4 (four) times daily as needed (pain).     etanercept (ENBREL SURECLICK) 50 MG/ML injection Inject 50 mg into the skin every Friday.     folic acid (FOLVITE) 1 MG tablet Take 1 mg by mouth daily.     gabapentin (NEURONTIN) 300 MG capsule Take 300 mg by mouth 2 (two) times daily.     methotrexate (RHEUMATREX) 2.5 MG tablet Take 4 mg by mouth every Tuesday.     metoprolol succinate (TOPROL-XL) 25 MG 24 hr tablet TAKE 1 TABLET BY MOUTH DAILY 100 tablet 0   nitroGLYCERIN (NITROSTAT) 0.3 MG SL tablet      rosuvastatin (CRESTOR) 40 MG tablet TAKE 1 TABLET BY MOUTH DAILY 100 tablet 0   Tetrahydrozoline HCl (VISINE OP) Place 1 drop into both eyes daily as needed (dry eyes).     vitamin B-12 (CYANOCOBALAMIN) 1000 MCG tablet Take 1,000 mcg by mouth daily.     HYDROcodone-acetaminophen (NORCO) 5-325 MG tablet Take 1 tablet  by mouth every 6 (six) hours as needed for moderate pain. (Patient not taking: Reported on 11/19/2021) 8 tablet 0   No current facility-administered medications for this visit.    REVIEW OF SYSTEMS:  [X]  denotes positive finding, [ ]  denotes negative finding Cardiac  Comments:  Chest pain or chest pressure:    Shortness of breath upon exertion:    Short of breath when lying flat:    Irregular heart rhythm:        Vascular    Pain in calf, thigh, or hip brought on by ambulation:    Pain in feet at night that wakes you up from your sleep:     Blood clot in your veins:    Leg swelling:         Pulmonary    Oxygen at home:    Productive cough:     Wheezing:         Neurologic    Sudden weakness in arms or legs:     Sudden numbness in arms or legs:     Sudden onset of difficulty  speaking or slurred speech:    Temporary loss of vision in one eye:     Problems with dizziness:         Gastrointestinal    Blood in stool:     Vomited blood:         Genitourinary    Burning when urinating:     Blood in urine:        Psychiatric    Major depression:         Hematologic    Bleeding problems:    Problems with blood clotting too easily:        Skin    Rashes or ulcers:        Constitutional    Fever or chills:      PHYSICAL EXAM: Vitals:   11/19/21 1404  BP: 117/77  Pulse: 82  Resp: 18  Temp: 97.9 F (36.6 C)  TempSrc: Temporal  SpO2: 91%  Weight: 206 lb (93.4 kg)  Height: 6' (1.829 m)    GENERAL: The patient is a well-nourished male, in no acute distress. The vital signs are documented above. CARDIAC: There is a regular rate and rhythm.  VASCULAR:  Left neck incision well-healed from previous carotid endarterectomy PULMONARY: No respiratory distress. ABDOMEN: Soft and non-tender.  No pain with deep palpation. MUSCULOSKELETAL: There are no major deformities or cyanosis. NEUROLOGIC: No focal weakness or paresthesias are detected.  Cranial nerves II through XII grossly intact SKIN: There are no ulcers or rashes noted. PSYCHIATRIC: The patient has a normal affect.  DATA:   01/19/22 abdomen 10/23/2021:  5.1 cm AAA  Assessment/Plan:  76 year old male well-known to our practice that has previously undergone a left carotid endarterectomy on 04/15/2021 for an asymptomatic high-grade stenosis.  He is referred back for a new finding of a 5.1 cm abdominal aortic aneurysm.  I reviewed his ultrasound and discussed the findings in detail with the patient.  Discussed current recommendations are to repair AAA in men at greater than 5.5 cm.  There is no indication for surgical intervention at this time.  Discussed we will follow this with another AAA duplex here in the office in 6 months.  Discussed if greater than 5.5 cm options would be endovascular stent graft  repair versus open repair.  I will also get a ultrasound of his carotid in 6 months as he will be due for surveillance.  Marty Heck, MD [facility] Office: [phone]

## 2021-11-20 DIAGNOSIS — M545 Low back pain, unspecified: Secondary | ICD-10-CM | POA: Diagnosis not present

## 2021-11-20 DIAGNOSIS — G8929 Other chronic pain: Secondary | ICD-10-CM | POA: Diagnosis not present

## 2021-11-20 DIAGNOSIS — M542 Cervicalgia: Secondary | ICD-10-CM | POA: Diagnosis not present

## 2021-11-20 DIAGNOSIS — Z79899 Other long term (current) drug therapy: Secondary | ICD-10-CM | POA: Diagnosis not present

## 2021-11-20 DIAGNOSIS — M0579 Rheumatoid arthritis with rheumatoid factor of multiple sites without organ or systems involvement: Secondary | ICD-10-CM | POA: Diagnosis not present

## 2021-11-21 ENCOUNTER — Encounter (HOSPITAL_COMMUNITY): Payer: Medicare Other

## 2021-11-21 ENCOUNTER — Other Ambulatory Visit: Payer: Self-pay

## 2021-11-21 DIAGNOSIS — I7143 Infrarenal abdominal aortic aneurysm, without rupture: Secondary | ICD-10-CM

## 2021-11-21 DIAGNOSIS — I6522 Occlusion and stenosis of left carotid artery: Secondary | ICD-10-CM

## 2021-11-27 ENCOUNTER — Inpatient Hospital Stay: Payer: Medicare Other | Admitting: Physician Assistant

## 2021-11-27 ENCOUNTER — Other Ambulatory Visit: Payer: Self-pay | Admitting: Radiology

## 2021-11-27 ENCOUNTER — Inpatient Hospital Stay: Payer: Medicare Other

## 2021-11-27 ENCOUNTER — Other Ambulatory Visit: Payer: Self-pay

## 2021-11-27 DIAGNOSIS — D696 Thrombocytopenia, unspecified: Secondary | ICD-10-CM

## 2021-11-27 DIAGNOSIS — R161 Splenomegaly, not elsewhere classified: Secondary | ICD-10-CM

## 2021-11-27 NOTE — Progress Notes (Signed)
Orders placed for BMBx per Rojelio Brenner, PA. Patient's wife has been made aware of BMBx request and that the insurance has denied PET scan at this time. Schedule pending.

## 2021-11-28 ENCOUNTER — Ambulatory Visit (HOSPITAL_COMMUNITY): Payer: Medicare Other

## 2021-11-28 ENCOUNTER — Ambulatory Visit (HOSPITAL_COMMUNITY)
Admission: RE | Admit: 2021-11-28 | Discharge: 2021-11-28 | Disposition: A | Discharge status: Home / Self Care | Payer: Medicare Other | Source: Ambulatory Visit | Attending: Physician Assistant | Admitting: Physician Assistant

## 2021-12-11 ENCOUNTER — Other Ambulatory Visit: Payer: Self-pay | Admitting: Radiology

## 2021-12-12 ENCOUNTER — Other Ambulatory Visit: Payer: Self-pay

## 2021-12-12 ENCOUNTER — Ambulatory Visit (HOSPITAL_COMMUNITY)
Admission: RE | Admit: 2021-12-12 | Discharge: 2021-12-12 | Disposition: A | Discharge status: Home / Self Care | Payer: Medicare Other | Source: Ambulatory Visit | Attending: Physician Assistant | Admitting: Physician Assistant

## 2021-12-12 ENCOUNTER — Encounter (HOSPITAL_COMMUNITY): Payer: Self-pay

## 2021-12-12 DIAGNOSIS — Z1379 Encounter for other screening for genetic and chromosomal anomalies: Secondary | ICD-10-CM | POA: Insufficient documentation

## 2021-12-12 DIAGNOSIS — D696 Thrombocytopenia, unspecified: Secondary | ICD-10-CM | POA: Insufficient documentation

## 2021-12-12 DIAGNOSIS — D72821 Monocytosis (symptomatic): Secondary | ICD-10-CM | POA: Diagnosis not present

## 2021-12-12 DIAGNOSIS — Z0389 Encounter for observation for other suspected diseases and conditions ruled out: Secondary | ICD-10-CM | POA: Diagnosis not present

## 2021-12-12 DIAGNOSIS — R161 Splenomegaly, not elsewhere classified: Secondary | ICD-10-CM | POA: Diagnosis not present

## 2021-12-12 LAB — CBC WITH DIFFERENTIAL/PLATELET
Abs Immature Granulocytes: 0.17 10*3/uL — ABNORMAL HIGH (ref 0.00–0.07)
Basophils Absolute: 0 10*3/uL (ref 0.0–0.1)
Basophils Relative: 0 %
Eosinophils Absolute: 0 10*3/uL (ref 0.0–0.5)
Eosinophils Relative: 1 %
HCT: 40.8 % (ref 39.0–52.0)
Hemoglobin: 13.1 g/dL (ref 13.0–17.0)
Immature Granulocytes: 3 %
Lymphocytes Relative: 18 %
Lymphs Abs: 1.2 10*3/uL (ref 0.7–4.0)
MCH: 27 pg (ref 26.0–34.0)
MCHC: 32.1 g/dL (ref 30.0–36.0)
MCV: 84.1 fL (ref 80.0–100.0)
Monocytes Absolute: 2.2 10*3/uL — ABNORMAL HIGH (ref 0.1–1.0)
Monocytes Relative: 34 %
Neutro Abs: 2.8 10*3/uL (ref 1.7–7.7)
Neutrophils Relative %: 44 %
Platelets: 77 10*3/uL — ABNORMAL LOW (ref 150–400)
RBC: 4.85 MIL/uL (ref 4.22–5.81)
RDW: 17.4 % — ABNORMAL HIGH (ref 11.5–15.5)
WBC: 6.4 10*3/uL (ref 4.0–10.5)
nRBC: 0 % (ref 0.0–0.2)

## 2021-12-12 MED ORDER — MIDAZOLAM HCL 2 MG/2ML IJ SOLN
INTRAMUSCULAR | Status: AC | PRN
  Administered 2021-12-12 (×2): 1 mg via INTRAVENOUS

## 2021-12-12 MED ORDER — MIDAZOLAM HCL 2 MG/2ML IJ SOLN
INTRAMUSCULAR | Status: AC
  Filled 2021-12-12: qty 4

## 2021-12-12 MED ORDER — SODIUM CHLORIDE 0.9 % IV SOLN: INTRAVENOUS | Status: DC

## 2021-12-12 MED ORDER — FENTANYL CITRATE (PF) 100 MCG/2ML IJ SOLN
INTRAMUSCULAR | Status: AC | PRN
  Administered 2021-12-12 (×2): 50 ug via INTRAVENOUS

## 2021-12-12 MED ORDER — FENTANYL CITRATE (PF) 100 MCG/2ML IJ SOLN
INTRAMUSCULAR | Status: AC
  Filled 2021-12-12: qty 4

## 2021-12-12 NOTE — Consult Note (Signed)
Chief Complaint: Patient was seen in consultation today for  CT guided bone marrow biopsy  Referring Physician(s): Katragadda,S  Supervising Physician: Corrie Mckusick  Patient Status: [facility] - Out-pt  History of Present Illness: Austin Cox is a 76 y.o. male with past medical history of rheumatoid arthritis, hypertension, hyperlipidemia, AAA, carotid artery stenosis, cerebrovascular disease, coronary artery disease with prior stenting who presents now with pancytopenia and splenomegaly of uncertain etiology.  He is scheduled today for CT-guided bone marrow biopsy for further evaluation.  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  Medications: Prior to Admission medications   Medication Sig Start Date End Date Taking? Authorizing Provider  acetaminophen (TYLENOL) 650 MG CR tablet Take 650 mg by mouth every 8 (eight) hours as needed for pain.   Yes [provider]  Cholecalciferol 25 MCG (1000 UT) capsule Take 1 capsule (1,000 Units total) by mouth daily. 03/28/21  Yes Rakes, Connye Burkitt, FNP  folic acid (FOLVITE) 1 MG tablet Take 1 mg by mouth daily. 09/23/19  Yes [provider]  gabapentin (NEURONTIN) 300 MG capsule Take 300 mg by mouth 2 (two) times daily. 09/06/13  Yes [provider]  metoprolol succinate (TOPROL-XL) 25 MG 24 hr tablet TAKE 1 TABLET BY MOUTH DAILY 11/11/21  Yes Rakes, Connye Burkitt, FNP   rosuvastatin (CRESTOR) 40 MG tablet TAKE 1 TABLET BY MOUTH DAILY 11/11/21  Yes Rakes, Connye Burkitt, FNP  Tetrahydrozoline HCl (VISINE OP) Place 1 drop into both eyes daily as needed (dry eyes).   Yes [provider]  vitamin B-12 (CYANOCOBALAMIN) 1000 MCG tablet Take 1,000 mcg by mouth daily.   Yes [provider]  aspirin 81 MG EC tablet Take 81 mg by mouth daily.    [provider]  baclofen (LIORESAL) 10 MG tablet Take 10 mg by mouth 2 (two) times daily. 09/26/21   [provider]  diclofenac Sodium (VOLTAREN) 1 % GEL Apply 1 application  topically 4 (four) times daily as needed (pain). 11/26/20   [provider]  etanercept (ENBREL SURECLICK) 50 MG/ML injection Inject 50 mg into the skin every Friday. 11/08/19   [provider]  HYDROcodone-acetaminophen (NORCO) 5-325 MG tablet Take 1 tablet by mouth every 6 (six) hours as needed for moderate pain. Patient not taking: Reported on 11/19/2021 04/16/21   Karoline Caldwell, PA-C  methotrexate (RHEUMATREX) 2.5 MG tablet Take 4 mg by mouth every Tuesday. 11/08/19   [provider]  nitroGLYCERIN (NITROSTAT) 0.3 MG SL tablet  05/14/20   [provider]     History reviewed. No pertinent family history.  Social History   Socioeconomic History   Marital status: Married    Spouse name: Not on file   Number of children: 3   Years of education: Not on file  Highest education level: Not on file  Occupational History   Occupation: retired  Tobacco Use   Smoking status: Former    Packs/day: 0.20    Years: 10.00    Total pack years: 2.00    Types: Cigarettes    Quit date: 07/06/1974    Years since quitting: 47.4   Smokeless tobacco: Never  Vaping Use   Vaping Use: Never used  Substance and Sexual Activity   Alcohol use: Not Currently    Alcohol/week: 1.0 standard drink of alcohol    Types: 1 Cans of beer per week   Drug use: Never   Sexual activity: Not on file  Other Topics  Concern   Not on file  Social History Narrative   3 children - one passed away   Son in [place]   They raised 2 teenage grandchildren after their daughter and son in law both passed away    Social Determinants of Health   Financial Resource Strain: Medium Risk (04/05/2021)   Overall Financial Resource Strain (CARDIA)    Difficulty of Paying Living Expenses: Somewhat hard  Food Insecurity: Food Insecurity Present (04/05/2021)   Hunger Vital Sign    Worried About [place] in the Last Year: Often true    Ran Out of Food in the Last Year: Sometimes true  Transportation Needs: No Transportation Needs (04/05/2021)   PRAPARE - Hydrologist (Medical): No    Lack of Transportation (Non-Medical): No  Physical Activity: Insufficiently Active (04/05/2021)   Exercise Vital Sign    Days of Exercise per Week: 6 days    Minutes of Exercise per Session: 20 min  Stress: No Stress Concern Present (04/05/2021)   [place]    Feeling of Stress : Not at all  Social Connections: Moderately Isolated (04/05/2021)   Social Connection and Isolation Panel [NHANES]    Frequency of Communication with Friends and Family: More than three times a week    Frequency of Social Gatherings with Friends and Family: Once a week    Attends Religious Services: Never    Marine scientist or Organizations: No    Attends Music therapist: Never    Marital Status: Married      Review of Systems currently denies fever, headache, chest pain, dyspnea, cough, abdominal/back pain, nausea, vomiting or bleeding.  He does have fatigue.  He is hard of hearing.  Vital Signs: BP 124/83   Pulse 73   Temp 98.2 F (36.8 C) (Oral)   Resp 18   Ht 6' (1.829 m)   Wt 208 lb (94.3 kg)   SpO2 97%   BMI 28.21 kg/m      Physical Exam awake, alert.  Chest with distant but clear breath sounds bilaterally.  Heart  with regular rate and rhythm.  Abdomen soft, positive bowel sounds, nontender.  Trace pretibial edema bilaterally.  Imaging: No results found.  Labs:  CBC: Recent Labs    04/16/21 0500 05/24/21 1253 09/18/21 0826 09/20/21 1156  WBC 13.1* 13.3* 3.3* 3.9*  HGB 10.2* 12.4* 13.0 12.9*  HCT 31.7* 37.2* 39.3 40.5  PLT 75* 85* 72* 71*    COAGS: Recent Labs    04/15/21 0550  INR 1.1  APTT 35    BMP: Recent Labs    03/19/21 1149 04/16/21 0500 09/18/21 0826  NA 141 136 141  K 4.3 4.1 3.7  CL 102 107 102  CO2  $'25 23 24  'H$ GLUCOSE 95 100* 194*  BUN $Re'9 13 12  'ghz$ CALCIUM 9.3 9.0 10.1  CREATININE 1.04 1.00 1.11  GFRNONAA  --  >60  --     LIVER FUNCTION TESTS: Recent Labs    03/19/21 1149 09/18/21 0826  BILITOT 0.6 0.8  AST 13 17  ALT 9 19  ALKPHOS 70 73  PROT 6.3 6.4  ALBUMIN 4.3 4.2    TUMOR MARKERS: No results for input(s): "AFPTM", "CEA", "CA199", "CHROMGRNA" in the last 8760 hours.  Assessment/Plan:  76 y.o. male with past medical history of rheumatoid arthritis, hypertension, hyperlipidemia, AAA, carotid artery stenosis, cerebrovascular disease, coronary artery disease with prior stenting who presents now with pancytopenia and splenomegaly of uncertain etiology.  He is scheduled today for CT-guided bone marrow biopsy for further evaluation.Risks and benefits of procedure was discussed with the patient  including, but not limited to bleeding, infection, damage to adjacent structures or low yield requiring additional tests.  All of the questions were answered and there is agreement to proceed.  Consent signed and in chart.     Thank you for this interesting consult.  I greatly enjoyed meeting Austin Cox and look forward to participating in their care.  A copy of this report was sent to the requesting provider on this date.  Electronically Signed: D. Rowe Robert, PA-C 12/12/2021, 8:40 AM   I spent a total of  15 Minutes   in face to face in clinical  consultation, greater than 50% of which was counseling/coordinating care for CT-guided bone marrow biopsy

## 2021-12-12 NOTE — Discharge Instructions (Signed)
Moderate Conscious Sedation, Adult, Care After This sheet gives you information about how to care for yourself after your procedure. Your health care provider may also give you more specific instructions. If you have problems or questions, contact your health careprovider. What can I expect after the procedure? After the procedure, it is common to have: Sleepiness for several hours. Impaired judgment for several hours. Difficulty with balance. Vomiting if you eat too soon. Follow these instructions at home: For the time period you were told by your health care provider: Rest. Do not participate in activities where you could fall or become injured. Do not drive or use machinery. Do not drink alcohol. Do not take sleeping pills or medicines that cause drowsiness. Do not make important decisions or sign legal documents. Do not take care of children on your own. Eating and drinking  Follow the diet recommended by your health care provider. Drink enough fluid to keep your urine pale yellow. If you vomit: Drink water, juice, or soup when you can drink without vomiting. Make sure you have little or no nausea before eating solid foods.  General instructions Take over-the-counter and prescription medicines only as told by your health care provider. Have a responsible adult stay with you for the time you are told. It is important to have someone help care for you until you are awake and alert. Do not smoke. Keep all follow-up visits as told by your health care provider. This is important. Contact a health care provider if: You are still sleepy or having trouble with balance after 24 hours. You feel light-headed. You keep feeling nauseous or you keep vomiting. You develop a rash. You have a fever. You have redness or swelling around the IV site. Get help right away if: You have trouble breathing. You have new-onset confusion at home. Summary After the procedure, it is common to feel  sleepy, have impaired judgment, or feel nauseous if you eat too soon. Rest after you get home. Know the things you should not do after the procedure. Follow the diet recommended by your health care provider and drink enough fluid to keep your urine pale yellow. Get help right away if you have trouble breathing or new-onset confusion at home. This information is not intended to replace advice given to you by your health care provider. Make sure you discuss any questions you have with your healthcare provider. Document Revised: 07/01/2019 Document Reviewed: 01/27/2019 Elsevier Patient Education  2022 Elsevier Inc.Bone Marrow Aspiration and Bone Marrow Biopsy, Adult, Care After This sheet gives you information about how to care for yourself after your procedure. Your health care provider may also give you more specific instructions. If you have problems or questions, contact your health careprovider. What can I expect after the procedure? After the procedure, it is common to have: Mild pain and tenderness. Swelling. Bruising. Follow these instructions at home: Puncture site care Follow instructions from your health care provider about how to take care of the puncture site. Make sure you: Wash your hands with soap and water before and after you change your bandage (dressing). If soap and water are not available, use hand sanitizer. Change your dressing as told by your health care provider. Check your puncture site every day for signs of infection. Check for: More redness, swelling, or pain. Fluid or blood. Warmth. Pus or a bad smell.  Activity Return to your normal activities as told by your health care provider. Ask your health care provider what activities are safe   for you. Do not lift anything that is heavier than 10 lb (4.5 kg), or the limit that you are told, until your health care provider says that it is safe. Do not drive for 24 hours if you were given a sedative during your  procedure. General instructions Take over-the-counter and prescription medicines only as told by your health care provider. Do not take baths, swim, or use a hot tub until your health care provider approves. Ask your health care provider if you may take showers. You may only be allowed to take sponge baths. If directed, put ice on the affected area. To do this: Put ice in a plastic bag. Place a towel between your skin and the bag. Leave the ice on for 20 minutes, 2-3 times a day. Keep all follow-up visits as told by your health care provider. This is important.  Contact a health care provider if: Your pain is not controlled with medicine. You have a fever. You have more redness, swelling, or pain around the puncture site. You have fluid or blood coming from the puncture site. Your puncture site feels warm to the touch. You have pus or a bad smell coming from the puncture site. Summary After the procedure, it is common to have mild pain, tenderness, swelling, and bruising. Follow instructions from your health care provider about how to take care of the puncture site and what activities are safe for you. Take over-the-counter and prescription medicines only as told by your health care provider. Contact a health care provider if you have any signs of infection, such as fluid or blood coming from the puncture site. This information is not intended to replace advice given to you by your health care provider. Make sure you discuss any questions you have with your healthcare provider. Document Revised: 07/20/2018 Document Reviewed: 07/20/2018 Elsevier Patient Education  2022 Elsevier Inc.  Please call Interventional Radiology clinic [phone] with any questions or concerns.  You may remove your dressing and shower tomorrow. 

## 2021-12-12 NOTE — Procedures (Signed)
Interventional Radiology Procedure Note  Procedure: CT guided aspirate and core biopsy of right posterior iliac bone Complications: None Recommendations: - Bedrest supine x 1 hrs - OTC's PRN  Pain - Follow biopsy results  Signed,  Tallon Gertz S. Demir Titsworth, DO    

## 2021-12-13 LAB — SURGICAL PATHOLOGY

## 2021-12-19 ENCOUNTER — Ambulatory Visit: Payer: Medicare Other | Admitting: Physician Assistant

## 2021-12-19 ENCOUNTER — Other Ambulatory Visit: Payer: Medicare Other

## 2021-12-20 ENCOUNTER — Encounter (HOSPITAL_COMMUNITY): Payer: Self-pay | Admitting: Physician Assistant

## 2021-12-20 LAB — SURGICAL PATHOLOGY

## 2021-12-24 ENCOUNTER — Inpatient Hospital Stay: Payer: Medicare Other | Admitting: Physician Assistant

## 2021-12-25 ENCOUNTER — Ambulatory Visit (INDEPENDENT_AMBULATORY_CARE_PROVIDER_SITE_OTHER): Payer: Medicare Other

## 2021-12-25 DIAGNOSIS — Z23 Encounter for immunization: Secondary | ICD-10-CM | POA: Diagnosis not present

## 2022-01-02 ENCOUNTER — Ambulatory Visit (HOSPITAL_COMMUNITY): Payer: Medicare Other

## 2022-01-08 ENCOUNTER — Other Ambulatory Visit: Payer: Medicare Other

## 2022-01-08 DIAGNOSIS — Z79899 Other long term (current) drug therapy: Secondary | ICD-10-CM | POA: Diagnosis not present

## 2022-01-08 DIAGNOSIS — D696 Thrombocytopenia, unspecified: Secondary | ICD-10-CM | POA: Diagnosis not present

## 2022-01-09 ENCOUNTER — Encounter (HOSPITAL_COMMUNITY)
Admission: RE | Admit: 2022-01-09 | Discharge: 2022-01-09 | Disposition: A | Discharge status: Home / Self Care | Payer: Medicare Other | Source: Ambulatory Visit | Attending: Physician Assistant | Admitting: Physician Assistant

## 2022-01-09 DIAGNOSIS — I712 Thoracic aortic aneurysm, without rupture, unspecified: Secondary | ICD-10-CM | POA: Diagnosis not present

## 2022-01-09 DIAGNOSIS — R933 Abnormal findings on diagnostic imaging of other parts of digestive tract: Secondary | ICD-10-CM | POA: Diagnosis not present

## 2022-01-09 DIAGNOSIS — R161 Splenomegaly, not elsewhere classified: Secondary | ICD-10-CM | POA: Diagnosis not present

## 2022-01-09 DIAGNOSIS — I7121 Aneurysm of the ascending aorta, without rupture: Secondary | ICD-10-CM | POA: Diagnosis not present

## 2022-01-09 DIAGNOSIS — N433 Hydrocele, unspecified: Secondary | ICD-10-CM | POA: Diagnosis not present

## 2022-01-09 DIAGNOSIS — D696 Thrombocytopenia, unspecified: Secondary | ICD-10-CM | POA: Diagnosis not present

## 2022-01-09 DIAGNOSIS — D61818 Other pancytopenia: Secondary | ICD-10-CM | POA: Insufficient documentation

## 2022-01-09 MED ORDER — FLUDEOXYGLUCOSE F - 18 (FDG) INJECTION
11.2300 | Freq: Once | INTRAVENOUS | Status: AC | PRN
  Administered 2022-01-09: 11.23 via INTRAVENOUS

## 2022-01-12 NOTE — Progress Notes (Unsigned)
Austin Cox, [place] [address]   CLINIC:  Medical Oncology/Hematology  PCP:  Baruch Gouty, [place] [address] [phone]   REASON FOR VISIT:  Follow-up for thrombocytopenia  PRIOR THERAPY: None   CURRENT THERAPY: Under work-up  INTERVAL HISTORY:  Austin Cox 76 y.o. male returns for routine follow-up of thrombocytopenia.  He was last seen by Tarri Abernethy PA-C on 11/14/2021 and returns today to discuss results from that work-up.  At today's visit, he reports feeling fairly well. *** No recent hospitalizations, surgeries, or changes in baseline health status.  He denies any B symptoms.  *** Chronic fatigue is at baseline.  *** He has easy bruising, takes aspirin 81 mg daily.  *** He denies any major bleeding events, recurrent infections, masses, or lymphadenopathy.  He has *** % energy and *** % appetite. He endorses that he is maintaining a stable weight.   REVIEW OF SYSTEMS:   ***  Review of Systems  Constitutional:  Positive for fatigue. Negative for appetite change, chills, diaphoresis, fever and unexpected weight change.  HENT:   Negative for lump/mass and nosebleeds.   Eyes:  Negative for eye problems.  Respiratory:  Negative for cough, hemoptysis and shortness of breath.   Cardiovascular:  Negative for chest pain, leg swelling and palpitations.  Gastrointestinal:  Negative for abdominal pain, blood in stool, constipation, diarrhea, nausea and vomiting.  Genitourinary:  Negative for hematuria.   Skin: Negative.   Neurological:  Negative for dizziness, headaches and light-headedness.  Hematological:  Does not bruise/bleed easily.      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   Past Surgical History:   Procedure Laterality Date   CATARACT EXTRACTION     ENDARTERECTOMY Left 04/15/2021   Procedure: LEFT CAROTID ENDARTERECTOMY WITH;  Surgeon: Marty Heck, MD;  Location: [facility] OR;  Service: Vascular;  Laterality: Left;   EYE SURGERY     detached retina right eye   FRACTURE SURGERY     right leg   Stent in heart       SOCIAL HISTORY:  Social History   Socioeconomic History   Marital status: Married    Spouse name: Not on file   Number of children: 3   Years of education: Not on file   Highest education level: Not on file  Occupational History   Occupation: retired  Tobacco Use   Smoking status: Former    Packs/day: 0.20    Years: 10.00    Total pack years: 2.00    Types: Cigarettes    Quit date: 07/06/1974    Years since quitting: 47.5   Smokeless tobacco: Never  Vaping Use   Vaping Use: Never used  Substance and Sexual Activity   Alcohol use: Not Currently    Alcohol/week: 1.0 standard drink of alcohol    Types: 1 Cans of beer per week   Drug use: Never   Sexual activity: Not on file  Other Topics Concern   Not on file  Social History Narrative   3 children - one passed away   Son in [place]   They raised 2 teenage grandchildren after their daughter and son in law both passed away  Social Determinants of Health   Financial Resource Strain: Medium Risk (04/05/2021)   Overall Financial Resource Strain (CARDIA)    Difficulty of Paying Living Expenses: Somewhat hard  Food Insecurity: Food Insecurity Present (04/05/2021)   Hunger Vital Sign    Worried About Running Out of Food in the Last Year: Often true    Ran Out of Food in the Last Year: Sometimes true  Transportation Needs: No Transportation Needs (04/05/2021)   PRAPARE - Hydrologist (Medical): No    Lack of Transportation (Non-Medical): No  Physical Activity: Insufficiently Active (04/05/2021)   Exercise Vital Sign    Days of Exercise per Week: 6 days    Minutes of  Exercise per Session: 20 min  Stress: No Stress Concern Present (04/05/2021)   [place]    Feeling of Stress : Not at all  Social Connections: Moderately Isolated (04/05/2021)   Social Connection and Isolation Panel [NHANES]    Frequency of Communication with Friends and Family: More than three times a week    Frequency of Social Gatherings with Friends and Family: Once a week    Attends Religious Services: Never    Marine scientist or Organizations: No    Attends Archivist Meetings: Never    Marital Status: Married  Human resources officer Violence: Not At Risk (04/05/2021)   Humiliation, Afraid, Rape, and Kick questionnaire    Fear of Current or Ex-Partner: No    Emotionally Abused: No    Physically Abused: No    Sexually Abused: No    FAMILY HISTORY:  No family history on file.  CURRENT MEDICATIONS:  Outpatient Encounter Medications as of 01/13/2022  Medication Sig   acetaminophen (TYLENOL) 650 MG CR tablet Take 650 mg by mouth every 8 (eight) hours as needed for pain.   aspirin 81 MG EC tablet Take 81 mg by mouth daily.   baclofen (LIORESAL) 10 MG tablet Take 10 mg by mouth 2 (two) times daily.   Cholecalciferol 25 MCG (1000 UT) capsule Take 1 capsule (1,000 Units total) by mouth daily.   diclofenac Sodium (VOLTAREN) 1 % GEL Apply 1 application  topically 4 (four) times daily as needed (pain).   etanercept (ENBREL SURECLICK) 50 MG/ML injection Inject 50 mg into the skin every Friday.   folic acid (FOLVITE) 1 MG tablet Take 1 mg by mouth daily.   gabapentin (NEURONTIN) 300 MG capsule Take 300 mg by mouth 2 (two) times daily.   HYDROcodone-acetaminophen (NORCO) 5-325 MG tablet Take 1 tablet by mouth every 6 (six) hours as needed for moderate pain. (Patient not taking: Reported on 11/19/2021)   methotrexate (RHEUMATREX) 2.5 MG tablet Take 4 mg by mouth every Tuesday.   metoprolol succinate (TOPROL-XL) 25 MG  24 hr tablet TAKE 1 TABLET BY MOUTH DAILY   nitroGLYCERIN (NITROSTAT) 0.3 MG SL tablet    rosuvastatin (CRESTOR) 40 MG tablet TAKE 1 TABLET BY MOUTH DAILY   Tetrahydrozoline HCl (VISINE OP) Place 1 drop into both eyes daily as needed (dry eyes).   vitamin B-12 (CYANOCOBALAMIN) 1000 MCG tablet Take 1,000 mcg by mouth daily.   No facility-administered encounter medications on file as of 01/13/2022.    ALLERGIES:  No Known Allergies   PHYSICAL EXAM:   ***  ECOG PERFORMANCE STATUS: 2 - Symptomatic, <50% confined to bed  There were no vitals filed for this visit. There were no vitals filed for this visit. Physical Exam  Constitutional:      Appearance: Normal appearance. He is obese.  HENT:     Head: Normocephalic and atraumatic.     Mouth/Throat:     Mouth: Mucous membranes are moist.  Eyes:     Extraocular Movements: Extraocular movements intact.     Pupils: Pupils are equal, round, and reactive to light.  Cardiovascular:     Rate and Rhythm: Normal rate and regular rhythm.     Pulses: Normal pulses.     Heart sounds: Normal heart sounds.  Pulmonary:     Effort: Pulmonary effort is normal.     Breath sounds: Normal breath sounds. Decreased air movement present.  Abdominal:     General: Bowel sounds are normal.     Palpations: Abdomen is soft.     Tenderness: There is no abdominal tenderness.  Musculoskeletal:        General: No swelling.     Right lower leg: No edema.     Left lower leg: No edema.  Lymphadenopathy:     Cervical: No cervical adenopathy.  Skin:    General: Skin is warm and dry.  Neurological:     General: No focal deficit present.     Mental Status: He is alert and oriented to person, place, and time.  Psychiatric:        Mood and Affect: Mood normal.        Behavior: Behavior normal.     LABORATORY DATA:  I have reviewed the labs as listed.  CBC    Component Value Date/Time   WBC 6.4 12/12/2021 0831   RBC 4.85 12/12/2021 0831   HGB 13.1  12/12/2021 0831   HGB 13.0 09/18/2021 0826   HCT 40.8 12/12/2021 0831   HCT 39.3 09/18/2021 0826   PLT 77 (L) 12/12/2021 0831   PLT 72 (LL) 09/18/2021 0826   MCV 84.1 12/12/2021 0831   MCV 82 09/18/2021 0826   MCH 27.0 12/12/2021 0831   MCHC 32.1 12/12/2021 0831   RDW 17.4 (H) 12/12/2021 0831   RDW 16.3 (H) 09/18/2021 0826   LYMPHSABS 1.2 12/12/2021 0831   LYMPHSABS 1.0 09/18/2021 0826   MONOABS 2.2 (H) 12/12/2021 0831   EOSABS 0.0 12/12/2021 0831   EOSABS 0.0 09/18/2021 0826   BASOSABS 0.0 12/12/2021 0831   BASOSABS 0.0 09/18/2021 0826      Latest Ref Rng & Units 09/18/2021    8:26 AM 04/16/2021    5:00 AM 03/19/2021   11:49 AM  CMP  Glucose 70 - 99 mg/dL 194  100  95   BUN 8 - 27 mg/dL _0 Creatinine 0.76 - 1.27 mg/dL 1.11  1.00  1.04   Sodium 134 - 144 mmol/L 141  136  141   Potassium 3.5 - 5.2 mmol/L 3.7  4.1  4.3   Chloride 96 - 106 mmol/L 102  107  102   CO2 20 - 29 mmol/L _1 Calcium 8.6 - 10.2 mg/dL 10.1  9.0  9.3   Total Protein 6.0 - 8.5 g/dL 6.4   6.3   Total Bilirubin 0.0 - 1.2 mg/dL 0.8   0.6   Alkaline Phos 44 - 121 IU/L 73   70   AST 0 - 40 IU/L 17   13   ALT 0 - 44 IU/L 19   9     DIAGNOSTIC IMAGING:  I have independently reviewed the relevant imaging and discussed with the patient.  ASSESSMENT & PLAN: 1.  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  Limited data prior to 2016 with normal platelets noted in 2009. - For the most part, thrombocytopenia from 2016-2022 has been mild, with platelets 100-150. - Downtrending platelets since May 2022 with platelets ranging between 70-90 - Diagnosed with rheumatoid arthritis around 2018 - Has been on methotrexate since about 2019, recently had methotrexate dose decreased to 5 tablets weekly on 08/05/2021.  Reduced to 4 tablets weekly as of 10/03/2021. - He has been on Enbrel since  about 2020 - Hematology work-up (09/20/2021): Flow cytometry: No monoclonal B-cell population or significant T-cell abnormalities identified.  (Negative for LGL leukemia, which can be seen in rheumatoid arthritis) Negative hepatitis C and hepatitis B Normal copper, folate, B12, methylmalonic acid SPEP negative.  Mildly elevated kappa free light chain 32.7 with normal lambda free light chain and light chain ratio.  Normal LDH. Reticulocyte 0.9% - Abdominal ultrasound (10/23/2021): Splenomegaly measuring 14.9 cm diameter, normal-appearing liver. - Bone marrow biopsy (12/12/2021): Hypercellular bone marrow with trilineage hematopoiesis.  Adequate number of megakaryocytes.  Mild myeloid changes present with no increase in blastic cells.  Overall features nonspecific/nondiagnostic, likely secondary in nature due to patient's known history of rheumatoid arthritis and methotrexate treatment, as well as possibly due to hypersplenism.  No evidence of lymphoproliferative process. - Cytogenetics (12/12/2021): 45,X,-Y[20].  Loss of Y chromosome noted, which is nonspecific; can happen in older men, but also can be seen in some hematologic malignancies and MDS in setting of clinical correlation. - PET scan *** - Most recent CBC (12/12/2021): Platelets 77, WBC 6.4, elevated monocytes 2.2, Hgb 13.1/MCV 84.1 - Reports easy bruising.  Denies any bleeding, B symptoms, recurrent infections, or lymphadenopathy. ***  - Differential diagnosis includes bone marrow infiltrative process (lymphoma), Felty's syndrome, myelosuppression from methotrexate, ITP, or early MDS. ***  - PLAN: ***   2.  INCIDENTAL FINDING: Abdominal aortic aneurysm   - Abdominal ultrasound (10/23/2021) with incidental finding of aneurysmal dilation of abdominal aorta with proximal abdominal aorta measuring 5.1 cm in diameter - PLAN: Patient informed of results.  Referral sent to vascular surgery, patient seen by Dr. Monica Martinez.  3.  Rheumatoid  arthritis - Predominantly involves knees and ankles for the past 4 to 5 years.  Seen by [facility] Lollie Sails at [facility]. - He has been on Enbrel and methotrexate for 2 to 3 years - Methotrexate dose decreased to 4 tablets weekly on 10/03/2021   4.  Other history - PMH: Rheumatoid arthritis, hypertension, hyperlipidemia, carotid artery stenosis, small vessel cerebrovascular disease - Walks with the help of cane/walker secondary to rheumatoid arthritis. - He worked as a Advertising copywriter pumps at service stations.  Exposure to gasoline and diesel present.  Quit smoking 45 years ago. - No family history of low platelets.  Sister died of metastatic breast cancer.  Daughter died of non-Hodgkin's lymphoma.  Mother had head and neck cancer.   All questions were answered. The patient knows to call the clinic with any problems, questions or concerns.  Medical decision making: Moderate ***   Time spent on visit: I spent *** minutes counseling the patient face to face. The total time spent in the appointment was *** minutes and more than 50% was on counseling.   Harriett Rush, PA-C  ***

## 2022-01-13 ENCOUNTER — Inpatient Hospital Stay: Payer: Medicare Other | Attending: Physician Assistant | Admitting: Physician Assistant

## 2022-01-13 ENCOUNTER — Encounter: Payer: Self-pay | Admitting: Internal Medicine

## 2022-01-13 VITALS — BP 108/71 | HR 73 | Temp 98.2°F | Resp 18 | Ht 72.0 in | Wt 206.1 lb

## 2022-01-13 DIAGNOSIS — R948 Abnormal results of function studies of other organs and systems: Secondary | ICD-10-CM | POA: Diagnosis not present

## 2022-01-13 DIAGNOSIS — D61818 Other pancytopenia: Secondary | ICD-10-CM | POA: Insufficient documentation

## 2022-01-13 DIAGNOSIS — D696 Thrombocytopenia, unspecified: Secondary | ICD-10-CM

## 2022-01-13 DIAGNOSIS — Z87891 Personal history of nicotine dependence: Secondary | ICD-10-CM | POA: Insufficient documentation

## 2022-01-13 DIAGNOSIS — M069 Rheumatoid arthritis, unspecified: Secondary | ICD-10-CM | POA: Diagnosis not present

## 2022-01-13 DIAGNOSIS — R161 Splenomegaly, not elsewhere classified: Secondary | ICD-10-CM | POA: Diagnosis not present

## 2022-01-13 NOTE — Patient Instructions (Addendum)
[facility] [place] **   You were seen today by Tarri Abernethy PA-C for your low platelets.    LOW PLATELETS Your bone marrow biopsy and PET scan did not show any sign of blood cancer or lymphoma. Your enlarged spleen is most likely related to your autoimmune disease (rheumatoid arthritis). Your low platelets may be caused by your enlarged spleen, or may be caused by your overactive immune system which can also attack your platelets. Regardless, you do not need any treatment for your low platelets at this time.  We will continue to monitor your platelets closely at follow-up visits.  ABNORMAL FINDING ON PET SCAN: Your PET scan did show an abnormality in your colon. We do not yet know what caused this abnormality, but it is very important that we refer you for colonoscopy to make sure you do not have any signs of colon cancer.  FOLLOW-UP APPOINTMENT: Office visit in 4 months to follow-up on platelets.  We will schedule you for an earlier visit if needed based on the results from your colonoscopy.  ** Thank you for trusting me with your healthcare!  I strive to provide all of my patients with quality care at each visit.  If you receive a survey for this visit, I would be so grateful to you for taking the time to provide feedback.  Thank you in advance!  ~ Lennox Leikam                   Dr. Derek Jack   &   Tarri Abernethy, PA-C   - - - - - - - - - - - - - - - - - -    Thank you for choosing [facility] at [facility] to provide your oncology and hematology care.  To afford each patient quality time with our provider, please arrive at least 15 minutes before your scheduled appointment time.   If you have a lab appointment with the [facility] please come in thru the Main Entrance and check in at the main information desk.  You need to re-schedule your appointment should you arrive 10 or  more minutes late.  We strive to give you quality time with our providers, and arriving late affects you and other patients whose appointments are after yours.  Also, if you no show three or more times for appointments you may be dismissed from the clinic at the providers discretion.     Again, thank you for choosing [facility].  Our hope is that these requests will decrease the amount of time that you wait before being seen by our physicians.       _____________________________________________________________  Should you have questions after your visit to [facility], please contact our office at [phone] and follow the prompts.  Our office hours are 8:00 a.m. and 4:30 p.m. Monday - Friday.  Please note that voicemails left after 4:00 p.m. may not be returned until the following business day.  We are closed weekends and major holidays.  You do have access to a nurse 24-7, just call the main number to the clinic [phone] and do not press any options, hold on the line and a nurse will answer the phone.    For prescription refill requests, have your pharmacy contact our office and allow 72 hours.

## 2022-01-16 ENCOUNTER — Other Ambulatory Visit: Payer: Self-pay | Admitting: Physician Assistant

## 2022-01-16 DIAGNOSIS — I7121 Aneurysm of the ascending aorta, without rupture: Secondary | ICD-10-CM

## 2022-01-16 NOTE — Progress Notes (Signed)
Discussed with patient and his wife regarding ascending aortic aneurysm found on PET scan. Referral placed to CT surgery.

## 2022-01-20 ENCOUNTER — Telehealth: Payer: Self-pay

## 2022-01-20 ENCOUNTER — Other Ambulatory Visit: Payer: Self-pay

## 2022-01-20 DIAGNOSIS — I7121 Aneurysm of the ascending aorta, without rupture: Secondary | ICD-10-CM

## 2022-01-20 NOTE — Telephone Encounter (Signed)
Spoke with the wife regarding CT Chest for 02/05/2022 with understanding verbalized.

## 2022-01-20 NOTE — Progress Notes (Unsigned)
Order for CTA chest entered per Ct surgeon request

## 2022-02-05 ENCOUNTER — Ambulatory Visit (HOSPITAL_COMMUNITY)
Admission: RE | Admit: 2022-02-05 | Discharge: 2022-02-05 | Disposition: A | Discharge status: Home / Self Care | Payer: Medicare Other | Source: Ambulatory Visit | Attending: Physician Assistant | Admitting: Physician Assistant

## 2022-02-05 DIAGNOSIS — I7121 Aneurysm of the ascending aorta, without rupture: Secondary | ICD-10-CM | POA: Diagnosis not present

## 2022-02-05 MED ORDER — IOHEXOL 350 MG/ML SOLN
100.0000 mL | Freq: Once | INTRAVENOUS | Status: AC | PRN
  Administered 2022-02-05: 100 mL via INTRAVENOUS

## 2022-02-11 ENCOUNTER — Encounter: Payer: Self-pay | Admitting: Thoracic Surgery (Cardiothoracic Vascular Surgery)

## 2022-02-11 ENCOUNTER — Other Ambulatory Visit: Payer: Self-pay | Admitting: Thoracic Surgery (Cardiothoracic Vascular Surgery)

## 2022-02-11 ENCOUNTER — Institutional Professional Consult (permissible substitution) (INDEPENDENT_AMBULATORY_CARE_PROVIDER_SITE_OTHER): Payer: Medicare Other | Admitting: Thoracic Surgery (Cardiothoracic Vascular Surgery)

## 2022-02-11 VITALS — BP 130/80 | HR 79 | Resp 20 | Ht 72.0 in | Wt 208.0 lb

## 2022-02-11 DIAGNOSIS — I712 Thoracic aortic aneurysm, without rupture, unspecified: Secondary | ICD-10-CM | POA: Diagnosis not present

## 2022-02-11 NOTE — Progress Notes (Signed)
PCP is Rakes, Doralee Albino, FNP Referring Provider is Carnella Guadalajara, *  Chief Complaint  Patient presents with   Thoracic Aortic Aneurysm    New patient consultation, PET 10/30, CTA chest 11/23    HPI: Austin Cox is sent for consultation regarding thoracic aortic aneurysm.  Austin Cox is a 76 year old man with a past history significant for CAD, stent, carotid stenosis, carotid enterectomy, hypertension, hyperlipidemia, abdominal aortic aneurysm, thoracic aortic atherosclerosis, chronic pain, gait instability, rheumatoid arthritis, degenerative disc disease, reflux, vitamin D deficiency, BPH, and thrombocytopenia.  He was being evaluated for thrombocytopenia.  A PET/CT showed mild splenomegaly.  There was no evidence of lymphoma.  He was noted to have a large ascending aneurysm.  He subsequently had CT angio of the chest.  It showed a 6.2 cm ascending aneurysm involving the arch.  There was significant thoracic aortic atherosclerotic disease.  There also was a 5.7 cm descending aneurysm.  He could not remember who his cardiologist was.  He knows he had stents in the past but is not sure if he ever had a heart attack.  He is not having any chest pain, pressure, or tightness.  Not particularly active.  Says he sits and watches TV most of the day.  They do have a garden which she will work in periodically.  He does mow his grass.  He thinks he can walk 2 blocks on level ground.  Can walk up a flight of stairs.   Father died suddenly in late 53s due to "heart attack"  Patient Active Problem List   Diagnosis Date Noted   Thoracic aortic aneurysm without rupture (HCC) 02/11/2022   AAA (abdominal aortic aneurysm) (HCC) 11/14/2021   Cerebrovascular small vessel disease 09/18/2021   Gait instability 09/18/2021   Asymptomatic carotid artery stenosis without infarction, left 04/15/2021   Carotid artery stenosis 04/09/2021   Chronic pain syndrome 03/19/2021   Hard of hearing 03/19/2021    Hypertensive retinopathy of both eyes 11/15/2019   Pseudophakia of both eyes 11/15/2019   Spondylosis without myelopathy or radiculopathy, lumbar region 10/24/2018   Rheumatoid arthritis involving multiple sites with positive rheumatoid factor (HCC) 10/24/2018   Chronic myofascial pain 10/24/2018   DDD (degenerative disc disease), cervical 10/24/2018   Cervical spondylosis without myelopathy 10/24/2018   Thrombocytopenia (HCC) 06/02/2017   Vitamin D deficiency 02/17/2017   Combined forms of age-related cataract of right eye 08/28/2016   Gastroesophageal reflux disease without esophagitis 12/18/2015   Chronic bilateral low back pain without sciatica 12/18/2015   Benign prostatic hyperplasia 12/18/2015   Pure hypercholesterolemia 10/17/2015   S/P coronary artery stent placement 12/21/2014   Coronary artery disease involving native coronary artery of native heart without angina pectoris 12/21/2014   Essential (primary) hypertension 10/11/2013   Nerve root pain 08/18/2013    Past Surgical History:  Procedure Laterality Date   CATARACT EXTRACTION     ENDARTERECTOMY Left 04/15/2021   Procedure: LEFT CAROTID ENDARTERECTOMY WITH;  Surgeon: Cephus Shelling, MD;  Location: [facility] OR;  Service: Vascular;  Laterality: Left;   EYE SURGERY     detached retina right eye   FRACTURE SURGERY     right leg   Stent in heart      History reviewed. No pertinent family history.  Social History Social History   Tobacco Use   Smoking status: Former    Packs/day: 0.20    Years: 10.00    Total pack years: 2.00    Types: Cigarettes    Quit  date: 07/06/1974    Years since quitting: 47.6   Smokeless tobacco: Never  Vaping Use   Vaping Use: Never used  Substance Use Topics   Alcohol use: Not Currently    Alcohol/week: 1.0 standard drink of alcohol    Types: 1 Cans of beer per week   Drug use: Never    Current Outpatient Medications  Medication Sig Dispense Refill   acetaminophen (TYLENOL) 650  MG CR tablet Take 650 mg by mouth every 8 (eight) hours as needed for pain.     aspirin 81 MG EC tablet Take 81 mg by mouth daily.     baclofen (LIORESAL) 10 MG tablet Take 10 mg by mouth 2 (two) times daily.     Cholecalciferol 25 MCG (1000 UT) capsule Take 1 capsule (1,000 Units total) by mouth daily. 90 capsule 2   diclofenac Sodium (VOLTAREN) 1 % GEL Apply 1 application  topically 4 (four) times daily as needed (pain).     etanercept (ENBREL SURECLICK) 50 MG/ML injection Inject 50 mg into the skin every Friday.     folic acid (FOLVITE) 1 MG tablet Take 1 mg by mouth daily.     gabapentin (NEURONTIN) 300 MG capsule Take 300 mg by mouth 2 (two) times daily.     HYDROcodone-acetaminophen (NORCO) 5-325 MG tablet Take 1 tablet by mouth every 6 (six) hours as needed for moderate pain. 8 tablet 0   methotrexate (RHEUMATREX) 2.5 MG tablet Take 4 mg by mouth every Tuesday.     metoprolol succinate (TOPROL-XL) 25 MG 24 hr tablet TAKE 1 TABLET BY MOUTH DAILY 100 tablet 0   nitroGLYCERIN (NITROSTAT) 0.3 MG SL tablet      rosuvastatin (CRESTOR) 40 MG tablet TAKE 1 TABLET BY MOUTH DAILY 100 tablet 0   Saw Palmetto 450-15 MG CAPS Take 2 tablets by mouth daily.     Tetrahydrozoline HCl (VISINE OP) Place 1 drop into both eyes daily as needed (dry eyes).     vitamin B-12 (CYANOCOBALAMIN) 1000 MCG tablet Take 1,000 mcg by mouth daily.     No current facility-administered medications for this visit.    No Known Allergies  Review of Systems  Constitutional:  Positive for activity change and fatigue. Negative for unexpected weight change.  HENT:  Positive for hearing loss.   Eyes:  Negative for visual disturbance.  Respiratory:  Negative for shortness of breath.   Cardiovascular:  Negative for chest pain and leg swelling.  Genitourinary:  Positive for frequency and urgency.  Musculoskeletal:  Positive for arthralgias, gait problem (Due to balance issues) and joint swelling.  Neurological:  Positive for  numbness. Negative for speech difficulty and weakness.       Balance issues  Hematological:  Negative for adenopathy. Bruises/bleeds easily.  All other systems reviewed and are negative.   BP 130/80 (BP Location: Left Arm, Patient Position: Sitting, Cuff Size: Normal)   Pulse 79   Resp 20   Ht 6' (1.829 m)   Wt 208 lb (94.3 kg)   SpO2 97% Comment: RA  BMI 28.21 kg/m  Physical Exam Vitals reviewed.  Constitutional:      General: He is not in acute distress.    Appearance: Normal appearance.  HENT:     Ears:     Comments: Hard of hearing Eyes:     General: No scleral icterus.    Extraocular Movements: Extraocular movements intact.  Cardiovascular:     Rate and Rhythm: Normal rate and regular rhythm.  Heart sounds: Murmur (2/6 systolic) heard.  Pulmonary:     Effort: Pulmonary effort is normal. No respiratory distress.     Breath sounds: Normal breath sounds. No wheezing.  Abdominal:     Palpations: Abdomen is soft.  Musculoskeletal:        General: No swelling.  Skin:    General: Skin is warm and dry.  Neurological:     General: No focal deficit present.     Mental Status: He is alert and oriented to person, place, and time.     Cranial Nerves: No cranial nerve deficit.     Diagnostic Tests: CT ANGIOGRAPHY CHEST WITH CONTRAST   TECHNIQUE: Multidetector CT imaging of the chest was performed using the standard protocol during bolus administration of intravenous contrast. Multiplanar CT image reconstructions and MIPs were obtained to evaluate the vascular anatomy.   RADIATION DOSE REDUCTION: This exam was performed according to the departmental dose-optimization program which includes automated exposure control, adjustment of the mA and/or kV according to patient size and/or use of iterative reconstruction technique.   CONTRAST:  OMNIPAQUE IOHEXOL 350 MG/ML SOLN   COMPARISON:  Prior PET-CT 01/09/2022   FINDINGS: Cardiovascular: The aortic root is  normal in caliber at 3.9 cm measured at the sinuses of Valsalva. No effacement of the sino-tubular junction. Significant fusiform aneurysmal dilation of the ascending and proximal transverse thoracic aorta with a maximal transverse diameter of 6.2 cm. Three vessel arch anatomy. Elongation of the thyroid isthmus and descending thoracic aorta results in tortuosity and a type 2 arch. Mild aneurysmal dilation of the proximal descending thoracic aorta measuring up to 5.7 cm. Irregular fibrofatty plaque throughout. The more distal descending thoracic aorta is also aneurysmal measuring 5.5 cm.   Calcifications present along the coronary arteries. The heart is normal in size. Unremarkable main pulmonary artery.   Mediastinum/Nodes: Unremarkable CT appearance of the thyroid gland. No suspicious mediastinal or hilar adenopathy. No soft tissue mediastinal mass. The thoracic esophagus is unremarkable.   Lungs/Pleura: 4 mm right upper lobe pulmonary nodule. Slight calcification. This almost certainly represents a benign granuloma. No focal airspace infiltrate, pulmonary edema, pleural effusion or pneumothorax. No suspicious pulmonary mass or nodule. A few additional tiny pulmonary nodules are visualized all measuring less than 3 mm.   Upper Abdomen: No acute abnormality.   Musculoskeletal: No acute fracture or aggressive appearing lytic or blastic osseous lesion.   Review of the MIP images confirms the above findings.   IMPRESSION: 1. Diffuse aneurysmal dilation of the ascending, transverse and descending thoracic aorta. The maximal diameter in the ascending thoracic aorta is 6.2 cm. Recommend semi-annual imaging followup by CTA or MRA and referral to cardiothoracic surgery if not already obtained. This recommendation follows 2010 ACCF/AHA/AATS/ACR/ASA/SCA/SCAI/SIR/STS/SVM Guidelines for the Diagnosis and Management of Patients With Thoracic Aortic Disease. Circulation. 2010; 121:  E266-e369TAA. 2. Small scattered and likely benign pulmonary nodules measuring no more than 3 mm. No imaging follow-up is recommended. 3. Aortic and coronary artery atherosclerotic vascular calcifications.   Aortic aneurysm NOS (ICD10-I71.9); Aortic Atherosclerosis (ICD10-I70.0).     Electronically Signed   By: Malachy Moan M.D.   On: 02/06/2022 07:54   I personally reviewed the CT images.  There is a 6 cm ascending/arch thoracic aortic aneurysm.  Extensive thoracic aortic atherosclerotic disease.  5.7 cm descending thoracic aneurysm.  Impression: Austin Cox is a 76 year old man with a past history significant for CAD, stent, carotid stenosis, carotid enterectomy, hypertension, hyperlipidemia, abdominal aortic aneurysm, thoracic aortic atherosclerosis, chronic  pain, gait instability, rheumatoid arthritis, degenerative disc disease, reflux, vitamin D deficiency, BPH, and thrombocytopenia.  He was found to have an ascending and arch aneurysm on a PET/CT done for evaluation of his thrombocytopenia.  CT of the chest showed a 6.2 cm ascending and arch aneurysm and a 5.7 cm descending aneurysm.  His aneurysm meets size criteria for surgical repair.  Unfortunately he is not a particularly good candidate for surgery.  I think we have to look at all aspects of situation before coming to a final determination.  CAD-he has a history of CAD.  He has had stents in the past.  He saw Dr. Antoine Poche in January.  I want to get his input.  He would need a catheterization prior to surgery should we decide to go that route.  We also would likely have to address his descending thoracic aorta.  I will discuss that with Dr. Chestine Spore his vascular surgeon.  Also going to order a 2D echocardiogram to rule out valvular disease and assess left ventricular function.  Plan: I will plan to see him back in 2 to 3 weeks to further discuss whether to opt for surgical repair or medical therapy.  Loreli Slot,  MD [facility] [phone]

## 2022-02-12 ENCOUNTER — Other Ambulatory Visit: Payer: Self-pay | Admitting: Thoracic Surgery (Cardiothoracic Vascular Surgery)

## 2022-02-12 ENCOUNTER — Ambulatory Visit: Payer: Medicare Other | Admitting: Internal Medicine

## 2022-02-17 ENCOUNTER — Ambulatory Visit: Payer: Medicare Other | Admitting: Physician Assistant

## 2022-02-17 ENCOUNTER — Other Ambulatory Visit: Payer: Medicare Other

## 2022-02-17 ENCOUNTER — Ambulatory Visit (HOSPITAL_COMMUNITY): Payer: Medicare Other | Attending: Thoracic Surgery (Cardiothoracic Vascular Surgery)

## 2022-02-17 DIAGNOSIS — I712 Thoracic aortic aneurysm, without rupture, unspecified: Secondary | ICD-10-CM | POA: Diagnosis not present

## 2022-02-17 LAB — ECHOCARDIOGRAM COMPLETE
Area-P 1/2: 3.03 cm2
P 1/2 time: 523 msec
S' Lateral: 3.4 cm

## 2022-02-18 NOTE — Progress Notes (Unsigned)
Cardiology Office Note   Date:  02/19/2022   ID:  Austin Cox, DOB Sep 26, 1945, MRN [id number]  PCP:  Sonny Masters, FNP  Cardiologist:   None Referring:  Sonny Masters, FNP   Chief Complaint  Patient presents with   Coronary Artery Disease     History of Present Illness: Austin Cox is a 76 y.o. male who presents for evaluation of hypertension, dyslipidemia and family history of CAD.   The patient has a history of stent x 2 and was followed at [facility].   I was able to find records from [place].  His cardiac catheterization in 2016 was electively by that report.  Apparently was for an abnormal EKG.  He had nonobstructive disease with 50% stenosis in the LAD.  There were 2 obtuse marginals with the first being subtotally occluded in the second 60 to 70% stenosis.  The right coronary artery had 70% stenosis.  Since I last saw him he has had aortogram and was found to have dilatation of the ascending and proximal transverse thoracic aorta with a max diameter of 62 mm ascending aneurysm and 57 mm descending aneurysm .  She saw Dr. Dorris Fetch and he was thought to be high risk for repair.    He is having no new cardiovascular symptoms.  He denies any chest pressure, neck or arm discomfort.  He had no palpitations, presyncope or syncope.  When the winter sets and he does not really do a lot of activities.  He does have some limitation by rheumatoid arthritis.  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     aspirin 81 MG EC tablet Take 81 mg by mouth daily.     baclofen (LIORESAL) 10 MG tablet Take 10 mg by mouth 2 (two) times daily.     Cholecalciferol 25 MCG (1000 UT) capsule Take 1 capsule (1,000 Units total) by mouth daily. 90 capsule 2   diclofenac Sodium (VOLTAREN) 1 % GEL Apply 1 application  topically 4 (four) times daily as needed (pain).     etanercept (ENBREL SURECLICK) 50 MG/ML injection Inject 50 mg into the skin every Friday.     folic acid (FOLVITE) 1 MG tablet Take 1 mg by mouth daily.     gabapentin (NEURONTIN) 300 MG capsule Take 300 mg by mouth 2 (two) times daily.     HYDROcodone-acetaminophen (NORCO) 5-325 MG tablet Take 1 tablet by mouth every 6 (six) hours as needed for moderate pain. 8 tablet 0   methotrexate (RHEUMATREX) 2.5 MG tablet Take 4 mg by mouth every Tuesday.     metoprolol succinate (TOPROL-XL) 25 MG 24 hr tablet  TAKE 1 TABLET BY MOUTH DAILY 100 tablet 0   nitroGLYCERIN (NITROSTAT) 0.3 MG SL tablet      rosuvastatin (CRESTOR) 40 MG tablet TAKE 1 TABLET BY MOUTH DAILY 100 tablet 0   Saw Palmetto 450-15 MG CAPS Take 2 tablets by mouth daily.     Tetrahydrozoline HCl (VISINE OP) Place 1 drop into both eyes daily as needed (dry eyes).     vitamin B-12 (CYANOCOBALAMIN) 1000 MCG tablet Take 1,000 mcg by mouth daily.     No current facility-administered medications for this visit.    Allergies:   Patient has no known allergies.    ROS:  Please see the history of present illness.   Otherwise, review of systems are positive for none.   All other systems are reviewed and negative.    PHYSICAL EXAM: VS:  BP 122/78   Pulse 70   Ht 6' (1.829 m)   Wt 208 lb 6.4 oz (94.5 kg)   SpO2 95%   BMI 28.26 kg/m  , BMI Body mass index is 28.26 kg/m. GENERAL:  Well  appearing NECK:  No jugular venous distention, waveform within normal limits, carotid upstroke brisk and symmetric, no bruits, no thyromegaly LUNGS:  Clear to auscultation bilaterally CHEST:  Unremarkable HEART:  PMI not displaced or sustained,S1 and S2 within normal limits, no S3, no S4, no clicks, no rubs, no murmurs ABD:  Flat, positive bowel sounds normal in frequency in pitch, no bruits, no rebound, no guarding, no midline pulsatile mass, no hepatomegaly, no splenomegaly EXT:  2 plus pulses throughout, no edema, no cyanosis no clubbing   EKG:  EKG is  ordered today. The ekg ordered today demonstrates sinus rhythm, rate  70 left axis deviation, old inferior infarct   Recent Labs: 03/19/2021: TSH 2.250 09/18/2021: ALT 19; BUN 12; Creatinine, Ser 1.11; Potassium 3.7; Sodium 141 12/12/2021: Hemoglobin 13.1; Platelets 77    Lipid Panel    Component Value Date/Time   CHOL 84 (L) 09/18/2021 0826   TRIG 88 09/18/2021 0826   HDL 26 (L) 09/18/2021 0826   CHOLHDL 3.2 09/18/2021 0826   CHOLHDL 3.3 04/16/2021 0500   VLDL 12 04/16/2021 0500   LDLCALC 40 09/18/2021 0826      Wt Readings from Last 3 Encounters:  02/19/22 208 lb 6.4 oz (94.5 kg)  02/11/22 208 lb (94.3 kg)  01/13/22 206 lb 2.1 oz (93.5 kg)      Other studies Reviewed: Additional studies/ records that were reviewed today include: TCTS note. Review of the above records demonstrates:  Please see elsewhere in the note.     ASSESSMENT AND PLAN:  Carotid stenosis:   He is status post endarterectomy and has upcoming follow-up with Dr. Chestine Spore.   HTN: Patient's blood pressure is at target.  No change in therapy.  CAD:   He had nonobstructive disease except for branch vessel OM stenosis.  He would need cardiac catheterization he has vascular surgery given his previous disease and high risk nature of the repair.   PVD:   He has aortic aneurysm as described.  He is going to see Dr. Chestine Spore soon.  He is going to continue with risk  reduction.  I will send a message to Dr. Chestine Spore and Dr. Dorris Fetch to let me know if surgery is being considered and we will plan catheterization.   Current medicines are reviewed at length with the patient today.  The patient does not have concerns regarding medicines.  The following changes have been made:  None  Labs/ tests ordered today include: None  Orders Placed This Encounter  Procedures   EKG 12-Lead     Disposition:   FU with me in 12 months.      Signed, Rollene Rotunda, MD  02/19/2022 11:43 AM    [facility]

## 2022-02-19 ENCOUNTER — Ambulatory Visit: Payer: Medicare Other | Admitting: Cardiology

## 2022-02-19 ENCOUNTER — Encounter: Payer: Self-pay | Admitting: Cardiology

## 2022-02-19 VITALS — BP 122/78 | HR 70 | Ht 72.0 in | Wt 208.4 lb

## 2022-02-19 DIAGNOSIS — I712 Thoracic aortic aneurysm, without rupture, unspecified: Secondary | ICD-10-CM | POA: Diagnosis not present

## 2022-02-19 DIAGNOSIS — I10 Essential (primary) hypertension: Secondary | ICD-10-CM

## 2022-02-19 DIAGNOSIS — I2510 Atherosclerotic heart disease of native coronary artery without angina pectoris: Secondary | ICD-10-CM

## 2022-02-19 NOTE — Patient Instructions (Signed)
Medication Instructions:  The current medical regimen is effective;  continue present plan and medications.  *If you need a refill on your cardiac medications before your next appointment, please call your pharmacy*  Follow-Up: At [facility], you and your health needs are our priority.  As part of our continuing mission to provide you with exceptional heart care, we have created designated Provider Care Teams.  These Care Teams include your primary Cardiologist (physician) and Advanced Practice Providers (APPs -  Physician Assistants and Nurse Practitioners) who all work together to provide you with the care you need, when you need it.  We recommend signing up for the patient portal called "MyChart".  Sign up information is provided on this After Visit Summary.  MyChart is used to connect with patients for Virtual Visits (Telemedicine).  Patients are able to view lab/test results, encounter notes, upcoming appointments, etc.  Non-urgent messages can be sent to your provider as well.   To learn more about what you can do with MyChart, go to [email or web address].    Your next appointment:   1 year(s)  The format for your next appointment:   In Person  Provider:   James Hochrein, MD     Important Information About Sugar       

## 2022-02-24 ENCOUNTER — Telehealth: Payer: Self-pay

## 2022-02-24 NOTE — Telephone Encounter (Addendum)
Pt's wife, Austin Cox, called requesting appt for pt.  Reviewed pt's chart, returned call for clarification, phone number not in service. Spoke to [place], [place] who reviewed chart and stated that the POC hasn't changed since he may need a cardiac catheterization before any other surgeries.  Two more attempts to call, but number not in service.  Pt's wife, Austin Cox, called requesting appt.  Called, number not in service. Sent a secure msg to [employer] to keep her on phone when she called again.  Pt's wife, Austin Cox, called again. The number she'd given was a different number than what was showing on phone ID. Pt had decided to proceed with surgery. Instructed her to call Dr. Dorris Fetch, CTCS who had planned to see him in 2-3 weeks according to his note. He would then either need to set up an appt there or set up cardiac catheterization with Dr. Antoine Poche before AAA surgery. Confirmed understanding.

## 2022-03-04 ENCOUNTER — Ambulatory Visit: Payer: Medicare Other | Admitting: Family Medicine

## 2022-03-05 ENCOUNTER — Ambulatory Visit: Payer: Medicare Other

## 2022-03-05 ENCOUNTER — Encounter: Payer: Self-pay | Admitting: Family Medicine

## 2022-03-05 ENCOUNTER — Ambulatory Visit (INDEPENDENT_AMBULATORY_CARE_PROVIDER_SITE_OTHER): Payer: Medicare Other | Admitting: Family Medicine

## 2022-03-05 VITALS — BP 128/76 | HR 80 | Temp 98.3°F | Ht 72.0 in | Wt 208.0 lb

## 2022-03-05 DIAGNOSIS — T162XXA Foreign body in left ear, initial encounter: Secondary | ICD-10-CM | POA: Diagnosis not present

## 2022-03-05 NOTE — Progress Notes (Signed)
Subjective:  Patient ID: Austin Cox, male    DOB: 28-Jun-1945, 76 y.o.   MRN: [id number]  Patient Care Team: Sonny Masters, FNP as PCP - General (Family Medicine) Doreatha Massed, MD as Medical Oncologist (Hematology)   Chief Complaint:  Hearing Problem   HPI: Austin Cox is a 76 y.o. male presenting on 03/05/2022 for Hearing Problem   Pt presents today for removal of FB in left ear. States he had a hearingaid in his ear that his friend gave him to try and the dome came off and is not stuck in his ear. States he can not hear out of his left ear now.      Relevant past medical, surgical, family, and social history reviewed and updated as indicated.  Allergies and medications reviewed and updated. Data reviewed: Chart in Epic.   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    Past Surgical History:  Procedure Laterality Date   CATARACT EXTRACTION     ENDARTERECTOMY Left 04/15/2021   Procedure: LEFT CAROTID ENDARTERECTOMY WITH;  Surgeon: Cephus Shelling, MD;  Location: [facility] OR;  Service: Vascular;  Laterality: Left;   EYE SURGERY     detached retina right eye   FRACTURE SURGERY     right leg   Stent in heart      Social History   Socioeconomic History   Marital status: Married    Spouse name: Not on file   Number of children: 3   Years of education: Not on file   Highest education level: Not on file  Occupational History   Occupation: retired  Tobacco Use   Smoking status: Former    Packs/day: 0.20    Years: 10.00    Total pack years: 2.00    Types: Cigarettes    Quit date: 07/06/1974    Years since quitting: 47.6   Smokeless tobacco: Never  Vaping Use   Vaping Use: Never used  Substance and Sexual Activity   Alcohol use: Not Currently    Alcohol/week: 1.0 standard drink of  alcohol    Types: 1 Cans of beer per week   Drug use: Never   Sexual activity: Not on file  Other Topics Concern   Not on file  Social History Narrative   3 children - one passed away   Son in [place]   They raised 2 teenage grandchildren after their daughter and son in law both passed away    Social Determinants of Health   Financial Resource Strain: Medium Risk (04/05/2021)   Overall Financial Resource Strain (CARDIA)    Difficulty of Paying Living Expenses: Somewhat hard  Food Insecurity: Food Insecurity Present (04/05/2021)   Hunger Vital Sign    Worried About Running Out of Food in the Last Year: Often true    Ran Out of Food in the Last Year: Sometimes true  Transportation Needs: No Transportation Needs (04/05/2021)   PRAPARE - Administrator, Civil Service (Medical): No    Lack of Transportation (Non-Medical): No  Physical Activity: Insufficiently Active (04/05/2021)   Exercise Vital Sign    Days of Exercise per Week: 6 days    Minutes of Exercise per Session: 20 min  Stress: No Stress Concern Present (04/05/2021)  [employer] of Occupational Health - Occupational Stress Questionnaire    Feeling of Stress : Not at all  Social Connections: Moderately Isolated (04/05/2021)   Social Connection and Isolation Panel [NHANES]    Frequency of Communication with Friends and Family: More than three times a week    Frequency of Social Gatherings with Friends and Family: Once a week    Attends Religious Services: Never    Database administrator or Organizations: No    Attends Banker Meetings: Never    Marital Status: Married  Catering manager Violence: Not At Risk (04/05/2021)   Humiliation, Afraid, Rape, and Kick questionnaire    Fear of Current or Ex-Partner: No    Emotionally Abused: No    Physically Abused: No    Sexually Abused: No    Outpatient Encounter Medications as of 03/05/2022  Medication Sig   acetaminophen (TYLENOL) 650 MG CR  tablet Take 650 mg by mouth every 8 (eight) hours as needed for pain.   aspirin 81 MG EC tablet Take 81 mg by mouth daily.   baclofen (LIORESAL) 10 MG tablet Take 10 mg by mouth 2 (two) times daily.   Cholecalciferol 25 MCG (1000 UT) capsule Take 1 capsule (1,000 Units total) by mouth daily.   diclofenac Sodium (VOLTAREN) 1 % GEL Apply 1 application  topically 4 (four) times daily as needed (pain).   etanercept (ENBREL SURECLICK) 50 MG/ML injection Inject 50 mg into the skin every Friday.   folic acid (FOLVITE) 1 MG tablet Take 1 mg by mouth daily.   gabapentin (NEURONTIN) 300 MG capsule Take 300 mg by mouth 2 (two) times daily.   HYDROcodone-acetaminophen (NORCO) 5-325 MG tablet Take 1 tablet by mouth every 6 (six) hours as needed for moderate pain.   methotrexate (RHEUMATREX) 2.5 MG tablet Take 4 mg by mouth every Tuesday.   metoprolol succinate (TOPROL-XL) 25 MG 24 hr tablet TAKE 1 TABLET BY MOUTH DAILY   nitroGLYCERIN (NITROSTAT) 0.3 MG SL tablet    rosuvastatin (CRESTOR) 40 MG tablet TAKE 1 TABLET BY MOUTH DAILY   Saw Palmetto 450-15 MG CAPS Take 2 tablets by mouth daily.   Tetrahydrozoline HCl (VISINE OP) Place 1 drop into both eyes daily as needed (dry eyes).   vitamin B-12 (CYANOCOBALAMIN) 1000 MCG tablet Take 1,000 mcg by mouth daily.   No facility-administered encounter medications on file as of 03/05/2022.    No Known Allergies  Review of Systems  HENT:  Positive for ear pain and hearing loss. Negative for ear discharge and tinnitus.   Musculoskeletal:  Positive for arthralgias, back pain and gait problem.  All other systems reviewed and are negative.       Objective:  BP 128/76   Pulse 80   Temp 98.3 F (36.8 C)   Ht 6' (1.829 m)   Wt 208 lb (94.3 kg)   SpO2 95%   BMI 28.21 kg/m    Wt Readings from Last 3 Encounters:  03/05/22 208 lb (94.3 kg)  02/19/22 208 lb 6.4 oz (94.5 kg)  02/11/22 208 lb (94.3 kg)    Physical Exam Vitals and nursing note reviewed.   Constitutional:      General: He is not in acute distress.    Appearance: Normal appearance. He is not ill-appearing, toxic-appearing or diaphoretic.  HENT:     Head: Normocephalic and atraumatic.     Right Ear: Decreased hearing noted.     Left Ear: Decreased hearing noted. A foreign body (clear  silicone hearingaid dome) is present.     Mouth/Throat:     Mouth: Mucous membranes are moist.  Eyes:     Comments: Right pupil 2 mm, left pupil 4 mm, normal for pt   Cardiovascular:     Rate and Rhythm: Normal rate and regular rhythm.     Heart sounds: Normal heart sounds.  Pulmonary:     Effort: Pulmonary effort is normal.     Breath sounds: Normal breath sounds.  Skin:    General: Skin is warm and dry.     Capillary Refill: Capillary refill takes less than 2 seconds.  Neurological:     General: No focal deficit present.     Mental Status: He is alert and oriented to person, place, and time.     Gait: Gait abnormal (using walking stick).  Psychiatric:        Mood and Affect: Mood normal.        Behavior: Behavior normal.        Thought Content: Thought content normal.        Judgment: Judgment normal.     Results for orders placed or performed in visit on 02/17/22  ECHOCARDIOGRAM COMPLETE  Result Value Ref Range   Area-P 1/2 3.03 cm2   S' Lateral 3.40 cm   P 1/2 time 523 msec     Foreign Body Removal  Date/Time: 03/05/2022 3:04 PM  Performed by: Sonny Masters, FNP Authorized by: Sonny Masters, FNP  Body area: ear Location details: left ear  Sedation: Patient sedated: no  Patient restrained: no Patient cooperative: yes Localization method: ENT speculum and visualized Removal mechanism: alligator forceps Complexity: simple 1 objects recovered. Objects recovered: hearingaid dome Post-procedure assessment: foreign body removed Patient tolerance: patient tolerated the procedure well with no immediate complications     Pertinent labs & imaging results that were  available during my care of the patient were reviewed by me and considered in my medical decision making.  Assessment & Plan:  Vinay was seen today for hearing problem.  Diagnoses and all orders for this visit:  Foreign body of left ear, initial encounter FB removed with alligator forceps. Pt tolerated well. TM normal post FB removal.  -     Foreign Body Removal     Continue all other maintenance medications.  Follow up plan: Return if symptoms worsen or fail to improve.   Continue healthy lifestyle choices, including diet (rich in fruits, vegetables, and lean proteins, and low in salt and simple carbohydrates) and exercise (at least 30 minutes of moderate physical activity daily).  Educational handout given for FB ear  The above assessment and management plan was discussed with the patient. The patient verbalized understanding of and has agreed to the management plan. Patient is aware to call the clinic if they develop any new symptoms or if symptoms persist or worsen. Patient is aware when to return to the clinic for a follow-up visit. Patient educated on when it is appropriate to go to the emergency department.   Kari Baars, FNP-C [facility] [phone]

## 2022-03-11 ENCOUNTER — Ambulatory Visit: Payer: Medicare Other | Admitting: Family Medicine

## 2022-03-14 ENCOUNTER — Encounter: Payer: Self-pay | Admitting: Family Medicine

## 2022-03-14 ENCOUNTER — Telehealth (INDEPENDENT_AMBULATORY_CARE_PROVIDER_SITE_OTHER): Payer: Medicare Other | Admitting: Family Medicine

## 2022-03-14 DIAGNOSIS — H9193 Unspecified hearing loss, bilateral: Secondary | ICD-10-CM | POA: Diagnosis not present

## 2022-03-14 NOTE — Progress Notes (Signed)
Virtual Visit via telephone Note Due to COVID-19 pandemic this visit was conducted virtually. This visit type was conducted due to national recommendations for restrictions regarding the COVID-19 Pandemic (e.g. social distancing, sheltering in place) in an effort to limit this patient's exposure and mitigate transmission in our community. All issues noted in this document were discussed and addressed.  A physical exam was not performed with this format.   I connected with Austin Cox on 03/14/2022 at 1125 by telephone and verified that I am speaking with the correct person using two identifiers. Austin Cox is currently located at home and family is currently with them during visit. The provider, Kari Baars, FNP is located in their office at time of visit.  I discussed the limitations, risks, security and privacy concerns of performing an evaluation and management service by virtual visit and the availability of in person appointments. I also discussed with the patient that there may be a patient responsible charge related to this service. The patient expressed understanding and agreed to proceed.  Subjective:  Patient ID: Austin Cox, male    DOB: [birth year], 76 y.o.   MRN: [id number]  Chief Complaint:  Hearing Problem   HPI: Austin Cox is a 76 y.o. male presenting on 03/14/2022 for Hearing Problem   Pt reports ongoing hearing loss. He had been wearing a store bought set of hearing aids until the dome got lodged in his ear a few weeks ago. This was removed. Pt states he does not want to use these anymore as he is afraid the same will happen. Would like referral to audiologist.      Relevant past medical, surgical, family, and social history reviewed and updated as indicated.  Allergies and medications reviewed and updated.   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    Past Surgical History:  Procedure Laterality Date   CATARACT EXTRACTION     ENDARTERECTOMY Left 04/15/2021   Procedure: LEFT CAROTID ENDARTERECTOMY WITH;  Surgeon: Cephus Shelling, MD;  Location: [facility] OR;  Service: Vascular;  Laterality: Left;   EYE SURGERY     detached retina right eye   FRACTURE SURGERY     right leg   Stent in heart      Social History   Socioeconomic History   Marital status: Married    Spouse name: Not on file   Number of children: 3   Years of education: Not on file   Highest education level: Not on file  Occupational History   Occupation: retired  Tobacco Use   Smoking status: Former    Packs/day: 0.20    Years: 10.00    Total pack years: 2.00    Types: Cigarettes    Quit date: 07/06/1974    Years since quitting: 47.7   Smokeless tobacco: Never  Vaping Use   Vaping Use: Never used  Substance and Sexual Activity   Alcohol use: Not Currently    Alcohol/week: 1.0 standard drink of alcohol    Types: 1 Cans of beer per week   Drug use: Never   Sexual activity: Not on file  Other Topics Concern   Not on file  Social History Narrative   3 children - one passed away   Son in [place]  They raised 2 teenage grandchildren after their daughter and son in law both passed away    Social Determinants of Health   Financial Resource Strain: Medium Risk (04/05/2021)   Overall Financial Resource Strain (CARDIA)    Difficulty of Paying Living Expenses: Somewhat hard  Food Insecurity: Food Insecurity Present (04/05/2021)   Hunger Vital Sign    Worried About Running Out of Food in the Last Year: Often true    Ran Out of Food in the Last Year: Sometimes true  Transportation Needs: No Transportation Needs (04/05/2021)   PRAPARE - Administrator, Civil Service (Medical): No    Lack of Transportation (Non-Medical): No  Physical Activity: Insufficiently Active (04/05/2021)    Exercise Vital Sign    Days of Exercise per Week: 6 days    Minutes of Exercise per Session: 20 min  Stress: No Stress Concern Present (04/05/2021)   [employer] of Occupational Health - Occupational Stress Questionnaire    Feeling of Stress : Not at all  Social Connections: Moderately Isolated (04/05/2021)   Social Connection and Isolation Panel [NHANES]    Frequency of Communication with Friends and Family: More than three times a week    Frequency of Social Gatherings with Friends and Family: Once a week    Attends Religious Services: Never    Database administrator or Organizations: No    Attends Banker Meetings: Never    Marital Status: Married  Catering manager Violence: Not At Risk (04/05/2021)   Humiliation, Afraid, Rape, and Kick questionnaire    Fear of Current or Ex-Partner: No    Emotionally Abused: No    Physically Abused: No    Sexually Abused: No    Outpatient Encounter Medications as of 03/14/2022  Medication Sig   acetaminophen (TYLENOL) 650 MG CR tablet Take 650 mg by mouth every 8 (eight) hours as needed for pain.   aspirin 81 MG EC tablet Take 81 mg by mouth daily.   baclofen (LIORESAL) 10 MG tablet Take 10 mg by mouth 2 (two) times daily.   Cholecalciferol 25 MCG (1000 UT) capsule Take 1 capsule (1,000 Units total) by mouth daily.   diclofenac Sodium (VOLTAREN) 1 % GEL Apply 1 application  topically 4 (four) times daily as needed (pain).   etanercept (ENBREL SURECLICK) 50 MG/ML injection Inject 50 mg into the skin every Friday.   folic acid (FOLVITE) 1 MG tablet Take 1 mg by mouth daily.   gabapentin (NEURONTIN) 300 MG capsule Take 300 mg by mouth 2 (two) times daily.   HYDROcodone-acetaminophen (NORCO) 5-325 MG tablet Take 1 tablet by mouth every 6 (six) hours as needed for moderate pain.   methotrexate (RHEUMATREX) 2.5 MG tablet Take 4 mg by mouth every Tuesday.   metoprolol succinate (TOPROL-XL) 25 MG 24 hr tablet TAKE 1 TABLET BY MOUTH DAILY    nitroGLYCERIN (NITROSTAT) 0.3 MG SL tablet    rosuvastatin (CRESTOR) 40 MG tablet TAKE 1 TABLET BY MOUTH DAILY   Saw Palmetto 450-15 MG CAPS Take 2 tablets by mouth daily.   Tetrahydrozoline HCl (VISINE OP) Place 1 drop into both eyes daily as needed (dry eyes).   vitamin B-12 (CYANOCOBALAMIN) 1000 MCG tablet Take 1,000 mcg by mouth daily.   No facility-administered encounter medications on file as of 03/14/2022.    No Known Allergies  Review of Systems  HENT:  Positive for hearing loss. Negative for ear pain.   Musculoskeletal:  Positive for arthralgias, back pain and gait  problem.  All other systems reviewed and are negative.        Observations/Objective: No vital signs or physical exam, this was a virtual health encounter.  Pt alert and oriented, answers all questions appropriately, and able to speak in full sentences.    Assessment and Plan: Austin Cox was seen today for hearing problem.  Diagnoses and all orders for this visit:  Hearing decreased, bilateral Ongoing for several months, referral to audiology placed.  -     Ambulatory referral to Audiology     Follow Up Instructions: Return if symptoms worsen or fail to improve.    I discussed the assessment and treatment plan with the patient. The patient was provided an opportunity to ask questions and all were answered. The patient agreed with the plan and demonstrated an understanding of the instructions.   The patient was advised to call back or seek an in-person evaluation if the symptoms worsen or if the condition fails to improve as anticipated.  The above assessment and management plan was discussed with the patient. The patient verbalized understanding of and has agreed to the management plan. Patient is aware to call the clinic if they develop any new symptoms or if symptoms persist or worsen. Patient is aware when to return to the clinic for a follow-up visit. Patient educated on when it is appropriate to go  to the emergency department.    I provided 11 minutes of time during this telephone encounter.   Kari Baars, FNP-C [facility] [address] [place], [place] [address] [phone] 03/14/2022

## 2022-03-19 ENCOUNTER — Encounter: Payer: Self-pay | Admitting: Thoracic Surgery (Cardiothoracic Vascular Surgery)

## 2022-03-19 ENCOUNTER — Ambulatory Visit: Payer: Medicare Other | Admitting: Thoracic Surgery (Cardiothoracic Vascular Surgery)

## 2022-03-19 VITALS — BP 135/80 | HR 75 | Resp 20 | Ht 72.0 in | Wt 208.0 lb

## 2022-03-19 DIAGNOSIS — I712 Thoracic aortic aneurysm, without rupture, unspecified: Secondary | ICD-10-CM | POA: Diagnosis not present

## 2022-03-19 NOTE — Progress Notes (Signed)
[address]       [place] [address]             [phone]      HPI: Austin Cox returns to discuss possible surgery for ascending and descending thoracic aortic aneurysms.  Austin Cox is a 77 year old man with a past history of CAD, stent, carotid stenosis, carotid endarterectomy, hypertension, hyperlipidemia, AAA, thoracic aortic atherosclerosis, ascending and descending thoracic aortic aneurysms, chronic pain, gait instability, rheumatoid arthritis, degenerative disc disease, reflux, vitamin D deficiency, and thrombocytopenia.  He was being evaluated for thrombocytopenia.  A PET/CT showed mild splenomegaly.  He was noted to have an ascending aneurysm.  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  I saw him in the office in late November.  He has an appointment coming up with Dr. Percival Spanish.  Advised him to see Dr. Percival Spanish and then return to further discuss possible surgery after his visit with him.  He is extremely hard of hearing which makes communication difficult.  I tried my best to make sure that he was understanding the issues that we discussed.  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     aspirin 81 MG EC tablet Take 81 mg by mouth daily.     baclofen (LIORESAL) 10 MG tablet Take 10 mg by mouth 2 (two) times daily.     Cholecalciferol 25 MCG (1000 UT) capsule Take 1 capsule (1,000 Units total) by mouth daily. 90 capsule 2   diclofenac Sodium (VOLTAREN) 1 % GEL Apply 1 application   topically 4 (four) times daily as needed (pain).     etanercept (ENBREL SURECLICK) 50 MG/ML injection Inject 50 mg into the skin every Friday.     folic acid (FOLVITE) 1 MG tablet Take 1 mg by mouth daily.     gabapentin (NEURONTIN) 300 MG capsule Take 300 mg by mouth 2 (two) times daily.     HYDROcodone-acetaminophen (NORCO) 5-325 MG tablet Take 1 tablet by mouth every 6 (six) hours as needed for moderate pain. 8 tablet 0   methotrexate (RHEUMATREX) 2.5 MG tablet Take 4 mg by mouth every Tuesday.     metoprolol succinate (TOPROL-XL) 25 MG 24 hr tablet TAKE 1 TABLET BY MOUTH DAILY 100 tablet 0   nitroGLYCERIN (NITROSTAT) 0.3 MG SL tablet      rosuvastatin (CRESTOR) 40 MG tablet TAKE 1 TABLET BY MOUTH DAILY 100 tablet 0   Saw Palmetto 450-15 MG CAPS Take 2 tablets by mouth daily.     Tetrahydrozoline HCl (VISINE OP) Place 1 drop into both eyes daily as needed (dry eyes).     vitamin B-12 (CYANOCOBALAMIN) 1000 MCG tablet Take 1,000 mcg by mouth daily.     No current facility-administered medications for this visit.    Physical Exam BP 135/80   Pulse 75   Resp 20   Ht 6' (1.829 m)   Wt 208 lb (94.3 kg)   SpO2 94% Comment: RA  BMI 28.77 kg/m  77 year old man appears older than stated age Alert and oriented x 3, hard of hearing Walks with cane Lungs clear Cardiac regular rate and rhythm with 2/6 systolic murmur  Diagnostic Tests: IMPRESSIONS     1. There is severe dilation of the ascending aorta measuring 11mm. There  is severe dilation of the aortic arch which measures 5mm. There is severe  dilation of the descending thoracic aorta that measures 33mm based on  subcostal views. There is  borderline dilatation of the aortic root, measuring 39 mm.   2. Left ventricular ejection fraction, by estimation, is 50 to 55%. The  left ventricle has low normal function. Left ventricular endocardial  border not optimally defined to evaluate regional wall motion. There is  mild asymmetric  left ventricular  hypertrophy of the basal-septal segment. Left ventricular diastolic  parameters are consistent with Grade I diastolic dysfunction (impaired  relaxation).   3. Right ventricular systolic function is low normal. The right  ventricular size is normal. Tricuspid regurgitation signal is inadequate  for assessing PA pressure.   4. Left atrial size was mildly dilated.   5. The mitral valve is grossly normal. Trivial mitral valve  regurgitation.   6. The aortic valve is tricuspid. There is mild calcification of the  aortic valve. There is moderate thickening of the aortic valve. Aortic  valve regurgitation is mild. Aortic valve sclerosis/calcification is  present, without any evidence of aortic  stenosis.   7. The inferior vena cava is normal in size with greater than 50%  respiratory variability, suggesting right atrial pressure of 3 mmHg.   8. Cannot exclude a small PFO.    Impression: Austin Cox is a 77 year old man with a past history of CAD, stent, carotid stenosis, carotid endarterectomy, hypertension, hyperlipidemia, AAA, thoracic aortic atherosclerosis, ascending and descending thoracic aortic aneurysms, chronic pain, gait instability, rheumatoid arthritis, degenerative disc disease, reflux, vitamin D deficiency, and thrombocytopenia.  Thoracic aortic aneurysms -noted while being worked up for thrombocytopenia.  Ascending aneurysm is 6.2 cm and ascending aneurysm at 5.7 cm.  Both aneurysms meet size criteria for repair.  I had a long discussion with Mr. and Mrs. [place] today.  We discussed the indications, risks, benefits, and alternatives.  In regards to his ascending aneurysm medical therapy is really the only viable alternative.  He is elderly although that in and of itself is not a contraindication for surgery.  However he has a very sedentary lifestyle and multiple comorbidities which make addressing that difficulty including rheumatoid arthritis and degenerative  disc disease.  He has thrombocytopenia that has not been completely defined at this point in time.  In my opinion his risk of the surgery at least matches the risk of the aneurysm if does not exceed that.  I really have a hard time envisioning him having a good outcome with surgical intervention and recommend medical therapy for now.  He and his wife both understand that there is significant risk of some adverse event with the aneurysm of that size.  They also understand the reasoning for not recommending surgery.  I did recommend that he try to walk on a regular basis even if only for very short distances.  Plan: I will plan to see him back with a CT angio of the chest, abdomen, and pelvis in about 5 months (6 months from previous scan).  I spent over 15 minutes in review of records, images, and consultation with Austin Cox today. Melrose Nakayama, MD [facility] [phone]

## 2022-03-27 ENCOUNTER — Ambulatory Visit (INDEPENDENT_AMBULATORY_CARE_PROVIDER_SITE_OTHER): Payer: Medicare Other | Admitting: Family Medicine

## 2022-03-27 ENCOUNTER — Encounter: Payer: Self-pay | Admitting: Family Medicine

## 2022-03-27 VITALS — BP 124/76 | HR 86 | Temp 101.7°F

## 2022-03-27 DIAGNOSIS — Z20822 Contact with and (suspected) exposure to covid-19: Secondary | ICD-10-CM

## 2022-03-27 DIAGNOSIS — R051 Acute cough: Secondary | ICD-10-CM

## 2022-03-27 DIAGNOSIS — R509 Fever, unspecified: Secondary | ICD-10-CM

## 2022-03-27 LAB — VERITOR FLU A/B WAIVED
Influenza A: NEGATIVE
Influenza B: NEGATIVE

## 2022-03-27 MED ORDER — MOLNUPIRAVIR EUA 200MG CAPSULE: 4.0000 | ORAL_CAPSULE | Freq: Two times a day (BID) | ORAL | 0 refills | Status: AC

## 2022-03-27 NOTE — Progress Notes (Signed)
Subjective:  Patient ID: Austin Cox, male    DOB: [birth year], 77 y.o.   MRN: [id number]  Patient Care Team: Baruch Gouty, FNP as PCP - General (Family Medicine) Derek Jack, MD as Medical Oncologist (Hematology)   Chief Complaint:  Nasal Congestion, Cough, and Generalized Body Aches (X 2 days )   HPI: Austin Cox is a 77 y.o. male presenting on 03/27/2022 for Nasal Congestion, Cough, and Generalized Body Aches (X 2 days )   Cough This is a new problem. Episode onset: 2-3 days ago. The problem has been gradually worsening. The cough is Non-productive. Associated symptoms include chills, a fever, nasal congestion, postnasal drip and rhinorrhea. Pertinent negatives include no chest pain, ear congestion, ear pain, headaches, heartburn, hemoptysis, myalgias, rash, sore throat, shortness of breath, sweats, weight loss or wheezing. Nothing aggravates the symptoms. Risk factors: wife positive for COVID 2 days ago. He has tried rest (fluids, tylenol, Mucinex) for the symptoms. The treatment provided mild relief.    Relevant past medical, surgical, family, and social history reviewed and updated as indicated.  Allergies and medications reviewed and updated. Data reviewed: Chart in Epic.   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    Past Surgical History:  Procedure Laterality Date   CATARACT EXTRACTION     ENDARTERECTOMY Left 04/15/2021   Procedure: LEFT CAROTID ENDARTERECTOMY WITH;  Surgeon: Marty Heck, MD;  Location: [facility];  Service: Vascular;  Laterality: Left;   EYE SURGERY     detached retina right eye   FRACTURE SURGERY     right leg   Stent in heart      Social History   Socioeconomic History   Marital status: Married    Spouse name: Not on file   Number of children: 3   Years of  education: Not on file   Highest education level: Not on file  Occupational History   Occupation: retired  Tobacco Use   Smoking status: Former    Packs/day: 0.20    Years: 10.00    Total pack years: 2.00    Types: Cigarettes    Quit date: 07/06/1974    Years since quitting: 47.7   Smokeless tobacco: Never  Vaping Use   Vaping Use: Never used  Substance and Sexual Activity   Alcohol use: Not Currently    Alcohol/week: 1.0 standard drink of alcohol    Types: 1 Cans of beer per week   Drug use: Never   Sexual activity: Not on file  Other Topics Concern   Not on file  Social History Narrative   3 children - one passed away   Son in [place]   They raised 2 teenage grandchildren after their daughter and son in law both passed away    Social Determinants of Health   Financial Resource Strain: Medium Risk (04/05/2021)   Overall Financial Resource Strain ([place])    Difficulty of Paying Living Expenses: Somewhat hard  Food Insecurity: Food Insecurity Present (04/05/2021)   Hunger Vital Sign    Worried About [place] in the Last Year: Often true    Ran Out of Food in the Last Year: Sometimes true  Transportation Needs: No Transportation Needs (04/05/2021)   PRAPARE - Transportation  Lack of Transportation (Medical): No    Lack of Transportation (Non-Medical): No  Physical Activity: Insufficiently Active (04/05/2021)   Exercise Vital Sign    Days of Exercise per Week: 6 days    Minutes of Exercise per Session: 20 min  Stress: No Stress Concern Present (04/05/2021)   [employer] of Occupational Health - Occupational Stress Questionnaire    Feeling of Stress : Not at all  Social Connections: Moderately Isolated (04/05/2021)   Social Connection and Isolation Panel [NHANES]    Frequency of Communication with Friends and Family: More than three times a week    Frequency of Social Gatherings with Friends and Family: Once a week    Attends Religious Services: Never     Database administrator or Organizations: No    Attends Banker Meetings: Never    Marital Status: Married  Catering manager Violence: Not At Risk (04/05/2021)   Humiliation, Afraid, Rape, and Kick questionnaire    Fear of Current or Ex-Partner: No    Emotionally Abused: No    Physically Abused: No    Sexually Abused: No    Outpatient Encounter Medications as of 03/27/2022  Medication Sig   acetaminophen (TYLENOL) 650 MG CR tablet Take 650 mg by mouth every 8 (eight) hours as needed for pain.   aspirin 81 MG EC tablet Take 81 mg by mouth daily.   baclofen (LIORESAL) 10 MG tablet Take 10 mg by mouth 2 (two) times daily.   Cholecalciferol 25 MCG (1000 UT) capsule Take 1 capsule (1,000 Units total) by mouth daily.   diclofenac Sodium (VOLTAREN) 1 % GEL Apply 1 application  topically 4 (four) times daily as needed (pain).   etanercept (ENBREL SURECLICK) 50 MG/ML injection Inject 50 mg into the skin every Friday.   folic acid (FOLVITE) 1 MG tablet Take 1 mg by mouth daily.   gabapentin (NEURONTIN) 300 MG capsule Take 300 mg by mouth 2 (two) times daily.   HYDROcodone-acetaminophen (NORCO) 5-325 MG tablet Take 1 tablet by mouth every 6 (six) hours as needed for moderate pain.   methotrexate (RHEUMATREX) 2.5 MG tablet Take 4 mg by mouth every Tuesday.   metoprolol succinate (TOPROL-XL) 25 MG 24 hr tablet TAKE 1 TABLET BY MOUTH DAILY   molnupiravir EUA (LAGEVRIO) 200 mg CAPS capsule Take 4 capsules (800 mg total) by mouth 2 (two) times daily for 5 days.   nitroGLYCERIN (NITROSTAT) 0.3 MG SL tablet    rosuvastatin (CRESTOR) 40 MG tablet TAKE 1 TABLET BY MOUTH DAILY   Saw Palmetto 450-15 MG CAPS Take 2 tablets by mouth daily.   Tetrahydrozoline HCl (VISINE OP) Place 1 drop into both eyes daily as needed (dry eyes).   vitamin B-12 (CYANOCOBALAMIN) 1000 MCG tablet Take 1,000 mcg by mouth daily.   No facility-administered encounter medications on file as of 03/27/2022.    No Known  Allergies  Review of Systems  Constitutional:  Positive for activity change, appetite change, chills, fatigue and fever. Negative for diaphoresis, unexpected weight change and weight loss.  HENT:  Positive for congestion, postnasal drip and rhinorrhea. Negative for ear pain and sore throat.   Eyes: Negative.   Respiratory:  Positive for cough. Negative for apnea, hemoptysis, choking, chest tightness, shortness of breath, wheezing and stridor.   Cardiovascular:  Negative for chest pain, palpitations and leg swelling.  Gastrointestinal:  Negative for abdominal pain, blood in stool, constipation, diarrhea, heartburn, nausea and vomiting.  Endocrine: Negative.   Genitourinary:  Negative for  decreased urine volume, difficulty urinating, dysuria, frequency and urgency.  Musculoskeletal:  Negative for arthralgias and myalgias.  Skin: Negative.  Negative for rash.  Allergic/Immunologic: Negative.   Neurological:  Negative for dizziness, tremors, seizures, syncope, facial asymmetry, speech difficulty, weakness, light-headedness, numbness and headaches.  Hematological: Negative.   Psychiatric/Behavioral:  Negative for confusion, hallucinations, sleep disturbance and suicidal ideas.   All other systems reviewed and are negative.       Objective:  BP 124/76   Pulse 86   Temp (!) 101.7 F (38.7 C) (Temporal)   SpO2 94%    Wt Readings from Last 3 Encounters:  03/19/22 208 lb (94.3 kg)  03/05/22 208 lb (94.3 kg)  02/19/22 208 lb 6.4 oz (94.5 kg)    Physical Exam Vitals and nursing note reviewed.  Constitutional:      General: He is not in acute distress.    Appearance: He is ill-appearing. He is not toxic-appearing or diaphoretic.  HENT:     Head: Normocephalic and atraumatic.     Right Ear: Tympanic membrane, ear canal and external ear normal. Decreased hearing noted.     Left Ear: Tympanic membrane, ear canal and external ear normal. Decreased hearing noted.     Nose: Congestion  present.     Mouth/Throat:     Lips: Pink.     Mouth: Mucous membranes are moist.     Pharynx: Posterior oropharyngeal erythema present. No pharyngeal swelling or oropharyngeal exudate.     Tonsils: No tonsillar exudate or tonsillar abscesses.  Eyes:     Conjunctiva/sclera: Conjunctivae normal.     Comments: Right pupil 2 mm, left pupil 4 mm, normal for pt    Cardiovascular:     Rate and Rhythm: Normal rate and regular rhythm.     Heart sounds: Normal heart sounds.  Pulmonary:     Effort: Pulmonary effort is normal.     Breath sounds: Normal breath sounds. No wheezing, rhonchi or rales.  Musculoskeletal:     Right lower leg: No edema.     Left lower leg: No edema.  Skin:    General: Skin is warm and dry.     Capillary Refill: Capillary refill takes less than 2 seconds.  Neurological:     General: No focal deficit present.     Mental Status: He is alert.     Gait: Gait abnormal (in wheelchair).  Psychiatric:        Mood and Affect: Mood normal.        Behavior: Behavior normal.        Thought Content: Thought content normal.        Judgment: Judgment normal.     Results for orders placed or performed in visit on 02/17/22  ECHOCARDIOGRAM COMPLETE  Result Value Ref Range   Area-P 1/2 3.03 cm2   S' Lateral 3.40 cm   P 1/2 time 523 msec       Pertinent labs & imaging results that were available during my care of the patient were reviewed by me and considered in my medical decision making.  Assessment & Plan:  Lily was seen today for nasal congestion, cough and generalized body aches.  Diagnoses and all orders for this visit:  Acute cough Fever in adult Close exposure to COVID-19 virus Suspected COVID-19 virus infection Wife positive for COVID 2 days ago. Pt with cough, congestion, fever, and malaise for the last 2 days. Influenza in office negative. Will start antiviral therapy for COVID. Symptomatic care discussed  in detail. Aware to report new, worsening, or  persistent symptoms.  -     COVID-19, Flu A+B and RSV -     Veritor Flu A/B Waived -     molnupiravir EUA (LAGEVRIO) 200 mg CAPS capsule; Take 4 capsules (800 mg total) by mouth 2 (two) times daily for 5 days.     Continue all other maintenance medications.  Follow up plan: Return if symptoms worsen or fail to improve.   Continue healthy lifestyle choices, including diet (rich in fruits, vegetables, and lean proteins, and low in salt and simple carbohydrates) and exercise (at least 30 minutes of moderate physical activity daily).  Educational handout given for COVID-19  The above assessment and management plan was discussed with the patient. The patient verbalized understanding of and has agreed to the management plan. Patient is aware to call the clinic if they develop any new symptoms or if symptoms persist or worsen. Patient is aware when to return to the clinic for a follow-up visit. Patient educated on when it is appropriate to go to the emergency department.   Kari Baars, FNP-C [facility] [phone]

## 2022-03-28 LAB — COVID-19, FLU A+B AND RSV
Influenza A, NAA: NOT DETECTED
Influenza B, NAA: NOT DETECTED
RSV, NAA: NOT DETECTED
SARS-CoV-2, NAA: DETECTED — AB

## 2022-03-29 ENCOUNTER — Emergency Department (HOSPITAL_COMMUNITY): Payer: Medicare Other

## 2022-03-29 ENCOUNTER — Inpatient Hospital Stay (HOSPITAL_COMMUNITY)
Admission: EM | Admit: 2022-03-29 | Discharge: 2022-03-31 | DRG: 178 | Disposition: A | Discharge status: Home Health | Payer: Medicare Other | Attending: Internal Medicine | Admitting: Internal Medicine

## 2022-03-29 ENCOUNTER — Other Ambulatory Visit: Payer: Self-pay

## 2022-03-29 DIAGNOSIS — I2510 Atherosclerotic heart disease of native coronary artery without angina pectoris: Secondary | ICD-10-CM | POA: Diagnosis not present

## 2022-03-29 DIAGNOSIS — R627 Adult failure to thrive: Secondary | ICD-10-CM | POA: Insufficient documentation

## 2022-03-29 DIAGNOSIS — R6251 Failure to thrive (child): Secondary | ICD-10-CM | POA: Diagnosis not present

## 2022-03-29 DIAGNOSIS — I10 Essential (primary) hypertension: Secondary | ICD-10-CM | POA: Diagnosis present

## 2022-03-29 DIAGNOSIS — I7123 Aneurysm of the descending thoracic aorta, without rupture: Secondary | ICD-10-CM | POA: Diagnosis present

## 2022-03-29 DIAGNOSIS — Z7982 Long term (current) use of aspirin: Secondary | ICD-10-CM

## 2022-03-29 DIAGNOSIS — D6959 Other secondary thrombocytopenia: Secondary | ICD-10-CM | POA: Diagnosis not present

## 2022-03-29 DIAGNOSIS — Z23 Encounter for immunization: Secondary | ICD-10-CM | POA: Diagnosis not present

## 2022-03-29 DIAGNOSIS — E869 Volume depletion, unspecified: Secondary | ICD-10-CM | POA: Diagnosis not present

## 2022-03-29 DIAGNOSIS — M069 Rheumatoid arthritis, unspecified: Secondary | ICD-10-CM | POA: Diagnosis present

## 2022-03-29 DIAGNOSIS — U071 COVID-19: Principal | ICD-10-CM | POA: Diagnosis present

## 2022-03-29 DIAGNOSIS — R197 Diarrhea, unspecified: Secondary | ICD-10-CM | POA: Diagnosis not present

## 2022-03-29 DIAGNOSIS — E785 Hyperlipidemia, unspecified: Secondary | ICD-10-CM | POA: Diagnosis present

## 2022-03-29 DIAGNOSIS — D696 Thrombocytopenia, unspecified: Secondary | ICD-10-CM | POA: Diagnosis present

## 2022-03-29 DIAGNOSIS — Z955 Presence of coronary angioplasty implant and graft: Secondary | ICD-10-CM | POA: Diagnosis not present

## 2022-03-29 DIAGNOSIS — D731 Hypersplenism: Secondary | ICD-10-CM | POA: Diagnosis present

## 2022-03-29 DIAGNOSIS — E876 Hypokalemia: Secondary | ICD-10-CM | POA: Diagnosis not present

## 2022-03-29 DIAGNOSIS — A419 Sepsis, unspecified organism: Secondary | ICD-10-CM | POA: Diagnosis not present

## 2022-03-29 DIAGNOSIS — I712 Thoracic aortic aneurysm, without rupture, unspecified: Secondary | ICD-10-CM | POA: Diagnosis not present

## 2022-03-29 DIAGNOSIS — Z66 Do not resuscitate: Secondary | ICD-10-CM | POA: Diagnosis not present

## 2022-03-29 DIAGNOSIS — R531 Weakness: Secondary | ICD-10-CM | POA: Diagnosis not present

## 2022-03-29 DIAGNOSIS — R918 Other nonspecific abnormal finding of lung field: Secondary | ICD-10-CM | POA: Diagnosis present

## 2022-03-29 DIAGNOSIS — R000 Tachycardia, unspecified: Secondary | ICD-10-CM | POA: Diagnosis not present

## 2022-03-29 DIAGNOSIS — J029 Acute pharyngitis, unspecified: Secondary | ICD-10-CM

## 2022-03-29 DIAGNOSIS — Z87891 Personal history of nicotine dependence: Secondary | ICD-10-CM | POA: Diagnosis not present

## 2022-03-29 DIAGNOSIS — Z743 Need for continuous supervision: Secondary | ICD-10-CM | POA: Diagnosis not present

## 2022-03-29 DIAGNOSIS — I6529 Occlusion and stenosis of unspecified carotid artery: Secondary | ICD-10-CM | POA: Diagnosis not present

## 2022-03-29 DIAGNOSIS — Z79899 Other long term (current) drug therapy: Secondary | ICD-10-CM

## 2022-03-29 DIAGNOSIS — R059 Cough, unspecified: Secondary | ICD-10-CM | POA: Diagnosis not present

## 2022-03-29 DIAGNOSIS — Z79631 Long term (current) use of antimetabolite agent: Secondary | ICD-10-CM

## 2022-03-29 DIAGNOSIS — R7989 Other specified abnormal findings of blood chemistry: Secondary | ICD-10-CM | POA: Diagnosis not present

## 2022-03-29 DIAGNOSIS — E871 Hypo-osmolality and hyponatremia: Secondary | ICD-10-CM | POA: Diagnosis not present

## 2022-03-29 LAB — COMPREHENSIVE METABOLIC PANEL
ALT: 23 U/L (ref 0–44)
AST: 33 U/L (ref 15–41)
Albumin: 3.8 g/dL (ref 3.5–5.0)
Alkaline Phosphatase: 53 U/L (ref 38–126)
Anion gap: 8 (ref 5–15)
BUN: 15 mg/dL (ref 8–23)
CO2: 23 mmol/L (ref 22–32)
Calcium: 8.4 mg/dL — ABNORMAL LOW (ref 8.9–10.3)
Chloride: 101 mmol/L (ref 98–111)
Creatinine, Ser: 0.97 mg/dL (ref 0.61–1.24)
GFR, Estimated: >60 mL/min (ref >60)
Glucose, Bld: 95 mg/dL (ref 70–99)
Potassium: 3.6 mmol/L (ref 3.5–5.1)
Sodium: 132 mmol/L — ABNORMAL LOW (ref 135–145)
Total Bilirubin: 1.3 mg/dL — ABNORMAL HIGH (ref 0.3–1.2)
Total Protein: 7.1 g/dL (ref 6.5–8.1)

## 2022-03-29 LAB — URINALYSIS, ROUTINE W REFLEX MICROSCOPIC
Bacteria, UA: NONE SEEN
Bilirubin Urine: NEGATIVE
Glucose, UA: NEGATIVE mg/dL
Ketones, ur: 20 mg/dL — AB
Leukocytes,Ua: NEGATIVE
Nitrite: NEGATIVE
Protein, ur: >=300 mg/dL — AB
Specific Gravity, Urine: 1.017 (ref 1.005–1.030)
pH: 6 (ref 5.0–8.0)

## 2022-03-29 LAB — FERRITIN: Ferritin: 129 ng/mL (ref 24–336)

## 2022-03-29 LAB — PROTIME-INR
INR: 1.1 (ref 0.8–1.2)
Prothrombin Time: 14.6 seconds (ref 11.4–15.2)

## 2022-03-29 LAB — LACTIC ACID, PLASMA
Lactic Acid, Venous: 1.2 mmol/L (ref 0.5–1.9)
Lactic Acid, Venous: 1.4 mmol/L (ref 0.5–1.9)

## 2022-03-29 LAB — VITAMIN B12: Vitamin B-12: 534 pg/mL (ref 180–914)

## 2022-03-29 LAB — CK: Total CK: 82 U/L (ref 49–397)

## 2022-03-29 LAB — RESP PANEL BY RT-PCR (RSV, FLU A&B, COVID)  RVPGX2
Influenza A by PCR: NEGATIVE
Influenza B by PCR: NEGATIVE
Resp Syncytial Virus by PCR: NEGATIVE
SARS Coronavirus 2 by RT PCR: POSITIVE — AB

## 2022-03-29 LAB — GROUP A STREP BY PCR: Group A Strep by PCR: NOT DETECTED

## 2022-03-29 LAB — LACTATE DEHYDROGENASE: LDH: 138 U/L (ref 98–192)

## 2022-03-29 LAB — PROCALCITONIN: Procalcitonin: 0.19 ng/mL

## 2022-03-29 LAB — BRAIN NATRIURETIC PEPTIDE: B Natriuretic Peptide: 74 pg/mL (ref 0.0–100.0)

## 2022-03-29 LAB — LIPASE, BLOOD: Lipase: 44 U/L (ref 11–51)

## 2022-03-29 LAB — ABO/RH: ABO/RH(D): A POS

## 2022-03-29 LAB — TSH: TSH: 2.217 u[IU]/mL (ref 0.350–4.500)

## 2022-03-29 LAB — FOLATE: Folate: 20.1 ng/mL (ref >5.9)

## 2022-03-29 LAB — D-DIMER, QUANTITATIVE: D-Dimer, Quant: 1.75 ug/mL-FEU — ABNORMAL HIGH (ref 0.00–0.50)

## 2022-03-29 MED ORDER — LACTATED RINGERS IV BOLUS (SEPSIS)
500.0000 mL | Freq: Once | INTRAVENOUS | Status: AC
  Administered 2022-03-29: 500 mL via INTRAVENOUS

## 2022-03-29 MED ORDER — ACETAMINOPHEN 325 MG PO TABS: 650.0000 mg | ORAL_TABLET | Freq: Four times a day (QID) | ORAL | Status: DC | PRN

## 2022-03-29 MED ORDER — ACETAMINOPHEN 650 MG RE SUPP: 650.0000 mg | Freq: Four times a day (QID) | RECTAL | Status: DC | PRN

## 2022-03-29 MED ORDER — SODIUM CHLORIDE 0.9% IV SOLUTION: Freq: Once | INTRAVENOUS | Status: AC

## 2022-03-29 MED ORDER — HYDROCODONE BIT-HOMATROP MBR 5-1.5 MG/5ML PO SOLN
5.0000 mL | Freq: Once | ORAL | Status: AC
  Administered 2022-03-29: 5 mL via ORAL
  Filled 2022-03-29: qty 5

## 2022-03-29 MED ORDER — ONDANSETRON HCL 4 MG PO TABS: 4.0000 mg | ORAL_TABLET | Freq: Four times a day (QID) | ORAL | Status: DC | PRN

## 2022-03-29 MED ORDER — POTASSIUM CHLORIDE IN NACL 20-0.9 MEQ/L-% IV SOLN
INTRAVENOUS | Status: DC
  Filled 2022-03-29: qty 1000

## 2022-03-29 MED ORDER — ONDANSETRON HCL 4 MG/2ML IJ SOLN: 4.0000 mg | Freq: Once | INTRAMUSCULAR | Status: DC | PRN

## 2022-03-29 MED ORDER — VITAMIN B-12 1000 MCG PO TABS
1000.0000 ug | ORAL_TABLET | Freq: Every day | ORAL | Status: DC
  Administered 2022-03-30 – 2022-03-31 (×2): 1000 ug via ORAL
  Filled 2022-03-29 (×2): qty 1

## 2022-03-29 MED ORDER — MOLNUPIRAVIR EUA 200MG CAPSULE: 4.0000 | ORAL_CAPSULE | Freq: Two times a day (BID) | ORAL | Status: DC

## 2022-03-29 MED ORDER — HYDROCODONE BIT-HOMATROP MBR 5-1.5 MG/5ML PO SOLN
5.0000 mL | ORAL | Status: DC | PRN
  Administered 2022-03-30 – 2022-03-31 (×2): 5 mL via ORAL
  Filled 2022-03-29 (×2): qty 5

## 2022-03-29 MED ORDER — ONDANSETRON HCL 4 MG/2ML IJ SOLN: 4.0000 mg | Freq: Four times a day (QID) | INTRAMUSCULAR | Status: DC | PRN

## 2022-03-29 MED ORDER — FOLIC ACID 1 MG PO TABS
1.0000 mg | ORAL_TABLET | Freq: Every day | ORAL | Status: DC
  Administered 2022-03-30 – 2022-03-31 (×2): 1 mg via ORAL
  Filled 2022-03-29 (×2): qty 1

## 2022-03-29 MED ORDER — ROSUVASTATIN CALCIUM 20 MG PO TABS: 40.0000 mg | ORAL_TABLET | Freq: Every day | ORAL | Status: DC

## 2022-03-29 NOTE — ED Triage Notes (Signed)
Pt arrived [facility] for c/o sore throat and diarrhea , congested cough and fatigue. Pt was diagnosed with covid on Thurs and rx molnupiravir 200mg  . Pt has had 4 total doses since prescribed. Pt has seen blood tinged in mucous from coughing he believes it is from his throat.

## 2022-03-29 NOTE — ED Provider Notes (Signed)
[facility] EMERGENCY DEPARTMENT Provider Note   CSN: [id number] Arrival date & time: 03/29/22  0751     History  Chief Complaint  Patient presents with   Sore Throat   Diarrhea    Austin Cox is a 77 y.o. male.  He is brought in by ambulance from home for generalized weakness.  He was diagnosed with COVID 3 days ago, symptoms including sore throat cough poor p.o. intake diarrhea.  He was started on molnupiravir.  He continues to worsen at home and today when he coughed he had a little bit of blood in the sputum.  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  He has a history of rheumatoid arthritis and is on Enbrel  The history is provided by the patient, the spouse and the EMS personnel.  Influenza Presenting symptoms: cough, diarrhea, fatigue and sore throat   Presenting symptoms: no headaches   Severity:  Severe Onset quality:  Gradual Duration:  1 week Progression:  Unchanged Chronicity:  New Relieved by:  Nothing Worsened by:  Drinking and eating Ineffective treatments:  Rest Associated symptoms: decreased appetite and decreased physical activity   Risk factors: being elderly, immunocompromised state and sick contacts        Home Medications Prior to Admission medications   Medication Sig Start Date End Date Taking? Authorizing Provider  acetaminophen (TYLENOL) 650 MG CR tablet Take 650 mg by mouth every 8 (eight) hours as needed for pain.    [provider]  aspirin 81 MG EC tablet Take 81 mg by mouth daily.    [provider]  baclofen (LIORESAL) 10 MG tablet Take 10 mg by mouth 2 (two) times daily. 09/26/21   [provider]  Cholecalciferol 25 MCG (1000 UT) capsule Take 1 capsule (1,000 Units total) by mouth daily. 03/28/21   Baruch Gouty, FNP  diclofenac Sodium (VOLTAREN) 1 % GEL Apply 1 application  topically 4 (four) times daily as needed (pain).  11/26/20   [provider]  etanercept (ENBREL SURECLICK) 50 MG/ML injection Inject 50 mg into the skin every Friday. 11/08/19   [provider]  folic acid (FOLVITE) 1 MG tablet Take 1 mg by mouth daily. 09/23/19   [provider]  gabapentin (NEURONTIN) 300 MG capsule Take 300 mg by mouth 2 (two) times daily. 09/06/13   [provider]  HYDROcodone-acetaminophen (NORCO) 5-325 MG tablet Take 1 tablet by mouth every 6 (six) hours as needed for moderate pain. 04/16/21   Baglia, Corrina, PA-C  methotrexate (RHEUMATREX) 2.5 MG tablet Take 4 mg by mouth every Tuesday. 11/08/19   [provider]  metoprolol succinate (TOPROL-XL) 25 MG 24 hr tablet TAKE 1 TABLET BY MOUTH DAILY 11/11/21   Rakes, Connye Burkitt, FNP  molnupiravir EUA (LAGEVRIO) 200 mg CAPS capsule Take 4 capsules (800 mg total) by mouth 2 (two) times daily for 5 days. 03/27/22 04/01/22  Baruch Gouty, FNP  nitroGLYCERIN (NITROSTAT) 0.3 MG SL tablet  05/14/20   [provider]  rosuvastatin (CRESTOR) 40 MG tablet TAKE 1 TABLET BY MOUTH DAILY 11/11/21   Baruch Gouty, FNP  Saw Palmetto 450-15 MG CAPS Take 2 tablets by mouth daily.    [provider]  Tetrahydrozoline HCl (VISINE OP) Place 1 drop into both eyes daily as needed (dry eyes).    [provider]  vitamin B-12 (CYANOCOBALAMIN) 1000 MCG tablet Take 1,000 mcg by mouth daily.  [provider]      Allergies    Patient has no known allergies.    Review of Systems   Review of Systems  Constitutional:  Positive for decreased appetite and fatigue.  HENT:  Positive for hearing loss and sore throat.   Respiratory:  Positive for cough.   Cardiovascular:  Negative for chest pain.  Gastrointestinal:  Positive for diarrhea. Negative for abdominal pain.  Genitourinary:  Negative for dysuria.  Musculoskeletal:  Positive for arthralgias.  Neurological:  Negative for headaches.    Physical Exam Updated Vital Signs BP  129/62   Pulse 100   Temp 99.7 F (37.6 C) (Oral)   Resp 20   Ht 6' (1.829 m)   Wt 94.3 kg   SpO2 95%   BMI 28.21 kg/m  Physical Exam Vitals and nursing note reviewed.  Constitutional:      General: He is not in acute distress.    Appearance: He is well-developed. He is ill-appearing.  HENT:     Head: Normocephalic and atraumatic.  Eyes:     Conjunctiva/sclera: Conjunctivae normal.  Cardiovascular:     Rate and Rhythm: Regular rhythm. Tachycardia present.     Heart sounds: No murmur heard. Pulmonary:     Effort: Pulmonary effort is normal. No respiratory distress.     Breath sounds: Rhonchi present.  Abdominal:     Palpations: Abdomen is soft.     Tenderness: There is no abdominal tenderness. There is no guarding or rebound.  Musculoskeletal:     Cervical back: Neck supple.     Right lower leg: No edema.     Left lower leg: No edema.  Skin:    General: Skin is warm and dry.     Capillary Refill: Capillary refill takes less than 2 seconds.  Neurological:     General: No focal deficit present.     Mental Status: He is alert.     ED Results / Procedures / Treatments   Labs (all labs ordered are listed, but only abnormal results are displayed) Labs Reviewed  RESP PANEL BY RT-PCR (RSV, FLU A&B, COVID)  RVPGX2 - Abnormal; Notable for the following components:      Result Value   SARS Coronavirus 2 by RT PCR POSITIVE (*)    All other components within normal limits  COMPREHENSIVE METABOLIC PANEL - Abnormal; Notable for the following components:   Sodium 132 (*)    Calcium 8.4 (*)    Total Bilirubin 1.3 (*)    All other components within normal limits  CBC - Abnormal; Notable for the following components:   RDW 19.3 (*)    Platelets 18 (*)    All other components within normal limits  URINALYSIS, ROUTINE W REFLEX MICROSCOPIC - Abnormal; Notable for the following components:   Hgb urine dipstick MODERATE (*)    Ketones, ur 20 (*)    Protein, ur >=300 (*)    All  other components within normal limits  CULTURE, BLOOD (ROUTINE X 2)  CULTURE, BLOOD (ROUTINE X 2)  GROUP A STREP BY PCR  URINE CULTURE  LIPASE, BLOOD  LACTIC ACID, PLASMA  LACTIC ACID, PLASMA  PROTIME-INR  BRAIN NATRIURETIC PEPTIDE    EKG EKG Interpretation  Date/Time:  Saturday March 29 2022 08:30:01 EST Ventricular Rate:  101 PR Interval:  206 QRS Duration: 115 QT Interval:  356 QTC Calculation: 462 R Axis:   -77 Text Interpretation: Sinus tachycardia Nonspecific IVCD with LAD Inferior infarct, old Confirmed by Charm Barges,  Legrand Como [phone]) on 03/29/2022 8:39:25 AM  Radiology DG Chest Port 1 View  Result Date: 03/29/2022 CLINICAL DATA:  Sepsis. EXAM: PORTABLE CHEST 1 VIEW COMPARISON:  CT scan of the chest February 05, 2022 FINDINGS: The patient's known thoracic aortic aneurysm is similar in appearance compared to the scout film from the comparison CT scan. The cardiomediastinal silhouette is stable. No pneumothorax. The lungs are clear with no nodules, masses, or focal infiltrates identified. IMPRESSION: 1. The patient's known thoracic aortic aneurysm is similar in appearance compared to the scout film from the comparison CT scan. 2. No other abnormalities. Electronically Signed   By: Dorise Bullion III M.D.   On: 03/29/2022 09:06    Procedures .Critical Care  Performed by: Hayden Rasmussen, MD Authorized by: Hayden Rasmussen, MD   Critical care provider statement:    Critical care time (minutes):  45   Critical care time was exclusive of:  Separately billable procedures and treating other patients   Critical care was necessary to treat or prevent imminent or life-threatening deterioration of the following conditions:  Respiratory failure and dehydration   Critical care was time spent personally by me on the following activities:  Development of treatment plan with patient or surrogate, discussions with consultants, evaluation of patient's response to treatment, examination of  patient, obtaining history from patient or surrogate, ordering and performing treatments and interventions, ordering and review of laboratory studies, ordering and review of radiographic studies, pulse oximetry, re-evaluation of patient's condition and review of old charts   I assumed direction of critical care for this patient from another provider in my specialty: no       Medications Ordered in ED Medications  ondansetron (ZOFRAN) injection 4 mg (has no administration in time range)  0.9 % NaCl with KCl 20 mEq/ L  infusion ( Intravenous New Bag/Given 03/29/22 1106)  lactated ringers bolus 500 mL (0 mLs Intravenous Stopped 03/29/22 1100)    ED Course/ Medical Decision Making/ A&P Clinical Course as of 03/29/22 1711  Sat Mar 29, 2022  0927 Chest x-ray showing large thoracic aneurysm.  No clear infiltrate.  Awaiting radiology reading. [MB]  [phone] I reviewed results of lab work and imaging with patient and wife.  I think he would be better served by being in the hospital for hydration and other supportive care.  Patient and wife in agreement he is not doing well at home and would benefit from admission. [MB]  1010 Discussed with Dr. Carles Collet [facility] hospitalist who will evaluate patient for admission. [MB]    Clinical Course User Index [MB] Hayden Rasmussen, MD                             Medical Decision Making Amount and/or Complexity of Data Reviewed Labs: ordered. Radiology: ordered.  Risk Prescription drug management. Decision regarding hospitalization.  FIRMIN BELISLE was evaluated in Emergency Department on 03/29/2022 for the symptoms described in the history of present illness. He was evaluated in the context of the global COVID-19 pandemic, which necessitated consideration that the patient might be at risk for infection with the SARS-CoV-2 virus that causes COVID-19. Institutional protocols and algorithms that pertain to the evaluation of patients at risk for COVID-19 are in a state  of rapid change based on information released by regulatory bodies including the CDC and federal and state organizations. These policies and algorithms were followed during the patient's care in the ED. This patient  complains of cough sore throat shortness of breath fatigue; this involves an extensive number of treatment Options and is a complaint that carries with it a high risk of complications and morbidity. The differential includes dehydration, COVID, respiratory failure, hypoxia, PE  I ordered, reviewed and interpreted labs, which included CBC with normal white count normal hemoglobin, critically low platelets, chemistries and LFTs unremarkable, COVID positive strep negative, lactate not elevated I ordered medication IV fluids and reviewed PMP when indicated. I ordered imaging studies which included chest x-ray and I independently    visualized and interpreted imaging which showed enlarged thoracic aneurysm stable from priors Additional history obtained from patient's wife Previous records obtained and reviewed in epic including recent cardiothoracic notes I consulted Dr. Arbutus Leas [facility] hospitalist and discussed lab and imaging findings and discussed disposition.  Cardiac monitoring reviewed, sinus tachycardia Social determinants considered, patient with food insecurity and poor physical activity Critical Interventions: Initiation of fluids and workup of patient's tachypnea fatigue  After the interventions stated above, I reevaluated the patient and found patient to be resting more comfortably although still with increased work of breathing Admission and further testing considered, he would benefit from admission for IV hydration and further management of his COVID and failure to thrive.  He is also newly severely thrombocytopenic with some element of hemoptysis.         Final Clinical Impression(s) / ED Diagnoses Final diagnoses:  COVID-19 virus infection  Thrombocytopenia (HCC)   Failure to thrive (child)  Sore throat  Generalized weakness    Rx / DC Orders ED Discharge Orders     None         Terrilee Files, MD 03/29/22 1715

## 2022-03-29 NOTE — Hospital Course (Signed)
77 year old male with a history of ascending and descending thoracic aortic aneurysm, rheumatoid arthritis on methotrexate and Enbrel, nonobstructive coronary artery disease, carotid stenosis status post CEA, hypertension, hyperlipidemia, and thrombocytopenia presenting with 2 to 3-day history of worsening generalized weakness, decreased oral intake, and diarrhea.  The patient states that he began having coughing and chest congestion, myalgias, arthralgias and sore throat about 1 week prior to this admission.  He has been having fevers and chills up to 102 F at home.  He has also been complaining of loose stools, about 4 to 5/day without any hematochezia or melena.  He went to see his PCP on 03/27/2022.  The patient was diagnosed with COVID-19 and started on molnupiravir. Unfortunately, the patient continued to have increasing generalized weakness her point where he was needing assistance to get out of bed.  He has had decreased oral intake.  As result he presented for further evaluation and treatment.  He states that he is coughing so much that he has began developing some hemoptysis in the past 24 to 48 hours.  He denies any emesis, hematemesis, headache, neck pain, chest pain.  He has some dyspnea on exertion.  He denies any dysuria or hematuria. In the ED, the patient had a low-grade temperature of 99.7 F.  He was tachycardic 100-110, hemodynamically stable with oxygen saturation 95% on room air. WBC 4.5, hemoglobin 13.2, platelets 18,000.  Sodium 132, potassium 3.6, bicarbonate 23, serum creatinine 0.97.  AST 33, ALT 23, alk phos 53, total bilirubin 1.3.  UA negative for pyuria.  Chest x-ray was negative for infiltrates or edema. He was started on IVF with clinical improvement.  He remained stable on RA.  PT was consulted and recommended HHPT.

## 2022-03-29 NOTE — H&P (Signed)
History and Physical    Patient: Austin Cox [id number]:[id number] DOB: 1945-08-06 DOA: 03/29/2022 DOS: the patient was seen and examined on 03/29/2022 PCP: Baruch Gouty, FNP  Patient coming from: Home  Chief Complaint:  Chief Complaint  Patient presents with   Sore Throat   Diarrhea   HPI: Austin Cox is a 77 year old male with a history of ascending and descending thoracic aortic aneurysm, rheumatoid arthritis on methotrexate and Enbrel, nonobstructive coronary artery disease, carotid stenosis status post CEA, hypertension, hyperlipidemia, and TAVR cytopenia presenting with 2 to 3-day history of worsening generalized weakness, decreased oral intake, and diarrhea.  The patient states that he began having coughing and chest congestion, myalgias, arthralgias and sore throat about 1 week prior to this admission.  He has been having fevers and chills up to 102 F at home.  He has also been complaining of loose stools, about 4 to 5/day without any hematochezia or melena.  He went to see his PCP on 03/27/2022.  The patient was diagnosed with COVID-19 and started on molnupiravir. Unfortunately, the patient continued to have increasing generalized weakness her point where he was needing assistance to get out of bed.  He has had decreased oral intake.  As result he presented for further evaluation and treatment.  He states that he is coughing so much that he has began developing some hemoptysis in the past 24 to 48 hours.  He denies any emesis, hematemesis, headache, neck pain, chest pain.  He has some dyspnea on exertion.  He denies any dysuria or hematuria. In the ED, the patient had a low-grade temperature of 99.7 F.  He was tachycardic 100-110, hemodynamically stable with oxygen saturation 95% on room air. WBC 4.5, hemoglobin 13.2, platelets 18,000.  Sodium 132, potassium 3.6, bicarbonate 23, serum creatinine 0.97.  AST 33, ALT 23, alk phos 53, total bilirubin 1.3.  UA negative for pyuria.  Chest x-ray  was negative for infiltrates or edema.  Review of Systems: As mentioned in the history of present illness. All other systems reviewed and are negative. 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 His smoking use included cigarettes. He has a 2.00 pack-year smoking history. He has never used smokeless tobacco. He reports that he does not currently use alcohol after a past usage of about 1.0 standard drink of alcohol per week. He reports that he does not use drugs.  No Known Allergies  No family history on file.  Prior to Admission medications   Medication Sig Start Date End Date Taking? Authorizing Provider  acetaminophen (TYLENOL) 650 MG CR tablet Take 650 mg by mouth every 8 (eight) hours as needed for pain.    [provider]  aspirin 81 MG EC tablet Take 81 mg by mouth daily.  [provider]  baclofen (LIORESAL) 10 MG tablet Take 10 mg by mouth 2 (two) times daily. 09/26/21   [provider]  Cholecalciferol 25 MCG (1000 UT) capsule Take 1 capsule (1,000 Units total) by mouth daily. 03/28/21   Baruch Gouty, FNP  diclofenac Sodium (VOLTAREN) 1 % GEL Apply 1 application  topically 4 (four) times daily as needed (pain). 11/26/20   [provider]  etanercept (ENBREL SURECLICK) 50 MG/ML injection Inject 50 mg into the skin every Friday. 11/08/19   [provider]  folic acid  (FOLVITE) 1 MG tablet Take 1 mg by mouth daily. 09/23/19   [provider]  gabapentin (NEURONTIN) 300 MG capsule Take 300 mg by mouth 2 (two) times daily. 09/06/13   [provider]  HYDROcodone-acetaminophen (NORCO) 5-325 MG tablet Take 1 tablet by mouth every 6 (six) hours as needed for moderate pain. 04/16/21   Baglia, Corrina, PA-C  methotrexate (RHEUMATREX) 2.5 MG tablet Take 4 mg by mouth every Tuesday. 11/08/19   [provider]  metoprolol succinate (TOPROL-XL) 25 MG 24 hr tablet TAKE 1 TABLET BY MOUTH DAILY 11/11/21   Rakes, Connye Burkitt, FNP  molnupiravir EUA (LAGEVRIO) 200 mg CAPS capsule Take 4 capsules (800 mg total) by mouth 2 (two) times daily for 5 days. 03/27/22 04/01/22  Baruch Gouty, FNP  nitroGLYCERIN (NITROSTAT) 0.3 MG SL tablet  05/14/20   [provider]  rosuvastatin (CRESTOR) 40 MG tablet TAKE 1 TABLET BY MOUTH DAILY 11/11/21   Baruch Gouty, FNP  Saw Palmetto 450-15 MG CAPS Take 2 tablets by mouth daily.    [provider]  Tetrahydrozoline HCl (VISINE OP) Place 1 drop into both eyes daily as needed (dry eyes).    [provider]  vitamin B-12 (CYANOCOBALAMIN) 1000 MCG tablet Take 1,000 mcg by mouth daily.    [provider]    Physical Exam: Vitals:   03/29/22 0900 03/29/22 0930 03/29/22 1000 03/29/22 1030  BP: 113/78 (!) 130/93 124/70 (!) 137/96  Pulse: (!) 103 (!) 113 (!) 107 (!) 107  Resp: (!) 21 (!) 30 (!) 21 (!) 21  Temp:      TempSrc:      SpO2: 96% 95% 95% 98%  Weight:      Height:       GENERAL:  A&O x 3, NAD, well developed, cooperative, follows commands HEENT: [place]/AT, No thrush, No icterus, No oral ulcers Neck:  No neck mass, No meningismus, soft, supple CV: RRR, no S3, no S4, no rub, no JVD Lungs:  bilateral rales.  No wheeze Abd: soft/NT +BS, nondistended Ext: No edema, no lymphangitis, no cyanosis, no rashes Neuro:  CN II-XII intact, strength 4/5 in RUE, RLE, strength 4/5 LUE, LLE; sensation  intact bilateral; no dysmetria; babinski equivocal  Data Reviewed: Data reviewed above in history  Assessment and Plan: COVID-19 infection -He is stable on room air -Check CRP -Check ferritin -Check D-dimer -Check LDH -Continue molnupiravir  Failure to thrive/generalized weakness -Secondary to COVID-19 infection -TSH -U44 -Folic acid -UA negative for pyuria -PT evaluation once medically stable  Hemoptysis -Secondary to thrombocytopenia in the setting of intractable cough -Check D-dimer -Transfuse platelets -hycodan for cough  Thrombocytopenia -Patient has had mild thrombocytopenia since 2016 -Acute worsening secondary to infectious process -Baseline platelets 70-80000 -Transfuse 1 unit platelets -Patient has had extensive workup including bone marrow biopsy 12/12/2021--showed hypercellular marrow with trilineage hematopoiesis -Felt to be secondary to 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 Dorris Fetch -In essence, he was felt to be a poor candidate for surgical repair -Under observation for now with surveillance scans -Last CTA 02/05/2022 showed aneurysm at 6.2 cm  Rheumatoid arthritis -Continue methotrexate on Tuesdays -Continue folic acid  Diarrhea -Likely secondary to COVID-19 infection -Stool pathogen panel  Essential hypertension -Holding metoprolol succinate for now  Coronary artery disease -No chest pain presently -EKG without concerning ST-T wave change   Advance Care Planning: DNR--confirmed with pt and spouse 1/13  Consults: none  Family Communication: spouse 03/29/22  Severity of Illness: The appropriate patient status for this patient is INPATIENT. Inpatient status is judged to be reasonable and  necessary in order to provide the required intensity of service to ensure the patient's safety. The patient's presenting symptoms, physical exam findings, and initial radiographic and laboratory data in the context of their chronic comorbidities is felt to place them at high risk for further clinical deterioration. Furthermore, it is not anticipated that the patient will be medically stable for discharge from the hospital within 2 midnights of admission.   * I certify that at the point of admission it is my clinical judgment that the patient will require inpatient hospital care spanning beyond 2 midnights from the point of admission due to high intensity of service, high risk for further deterioration and high frequency of surveillance required.*  Author: Catarina Hartshorn, MD 03/29/2022 10:49 AM  For on call review [email or web address].

## 2022-03-30 ENCOUNTER — Inpatient Hospital Stay (HOSPITAL_COMMUNITY): Payer: Medicare Other

## 2022-03-30 ENCOUNTER — Encounter (HOSPITAL_COMMUNITY): Payer: Self-pay | Admitting: Internal Medicine

## 2022-03-30 DIAGNOSIS — R6251 Failure to thrive (child): Secondary | ICD-10-CM | POA: Diagnosis not present

## 2022-03-30 DIAGNOSIS — R531 Weakness: Secondary | ICD-10-CM

## 2022-03-30 DIAGNOSIS — U071 COVID-19: Secondary | ICD-10-CM | POA: Diagnosis not present

## 2022-03-30 DIAGNOSIS — D696 Thrombocytopenia, unspecified: Secondary | ICD-10-CM | POA: Diagnosis not present

## 2022-03-30 LAB — BASIC METABOLIC PANEL
Anion gap: 8 (ref 5–15)
BUN: 16 mg/dL (ref 8–23)
CO2: 21 mmol/L — ABNORMAL LOW (ref 22–32)
Calcium: 8 mg/dL — ABNORMAL LOW (ref 8.9–10.3)
Chloride: 104 mmol/L (ref 98–111)
Creatinine, Ser: 0.8 mg/dL (ref 0.61–1.24)
GFR, Estimated: >60 mL/min (ref >60)
Glucose, Bld: 87 mg/dL (ref 70–99)
Potassium: 3.6 mmol/L (ref 3.5–5.1)
Sodium: 133 mmol/L — ABNORMAL LOW (ref 135–145)

## 2022-03-30 LAB — CBC
HCT: 37 % — ABNORMAL LOW (ref 39.0–52.0)
Hemoglobin: 11.6 g/dL — ABNORMAL LOW (ref 13.0–17.0)
MCH: 26 pg (ref 26.0–34.0)
MCHC: 31.4 g/dL (ref 30.0–36.0)
MCV: 82.8 fL (ref 80.0–100.0)
Platelets: 30 10*3/uL — ABNORMAL LOW (ref 150–400)
RBC: 4.47 MIL/uL (ref 4.22–5.81)
RDW: 18.8 % — ABNORMAL HIGH (ref 11.5–15.5)
WBC: 4.3 10*3/uL (ref 4.0–10.5)
nRBC: 0 % (ref 0.0–0.2)

## 2022-03-30 LAB — BLOOD CULTURE ID PANEL (REFLEXED) - BCID2

## 2022-03-30 LAB — PREPARE PLATELET PHERESIS: Unit division: 0

## 2022-03-30 LAB — BPAM PLATELET PHERESIS
Blood Product Expiration Date: 202401172359
ISSUE DATE / TIME: 202401132313
Unit Type and Rh: 6200

## 2022-03-30 LAB — C-REACTIVE PROTEIN
CRP: 8.7 mg/dL — ABNORMAL HIGH (ref <1.0)
CRP: 8.7 mg/dL — ABNORMAL HIGH (ref <1.0)

## 2022-03-30 LAB — URINE CULTURE: Culture: NO GROWTH

## 2022-03-30 LAB — FERRITIN: Ferritin: 131 ng/mL (ref 24–336)

## 2022-03-30 LAB — D-DIMER, QUANTITATIVE: D-Dimer, Quant: 1.7 ug/mL-FEU — ABNORMAL HIGH (ref 0.00–0.50)

## 2022-03-30 LAB — T4, FREE: Free T4: 1.17 ng/dL — ABNORMAL HIGH (ref 0.61–1.12)

## 2022-03-30 MED ORDER — MOLNUPIRAVIR EUA 200MG CAPSULE
4.0000 | ORAL_CAPSULE | Freq: Two times a day (BID) | ORAL | Status: DC
  Administered 2022-03-30 – 2022-03-31 (×3): 800 mg via ORAL

## 2022-03-30 MED ORDER — ROSUVASTATIN CALCIUM 20 MG PO TABS
40.0000 mg | ORAL_TABLET | Freq: Every day | ORAL | Status: DC
  Administered 2022-03-30 – 2022-03-31 (×2): 40 mg via ORAL
  Filled 2022-03-30 (×3): qty 2

## 2022-03-30 MED ORDER — PNEUMOCOCCAL 20-VAL CONJ VACC 0.5 ML IM SUSY
0.5000 mL | PREFILLED_SYRINGE | INTRAMUSCULAR | Status: AC
  Administered 2022-03-31: 0.5 mL via INTRAMUSCULAR
  Filled 2022-03-30: qty 0.5

## 2022-03-30 MED ORDER — VANCOMYCIN HCL 1500 MG/300ML IV SOLN
1500.0000 mg | Freq: Once | INTRAVENOUS | Status: AC
  Administered 2022-03-30: 1500 mg via INTRAVENOUS
  Filled 2022-03-30: qty 300

## 2022-03-30 MED ORDER — VANCOMYCIN HCL IN DEXTROSE 1-5 GM/200ML-% IV SOLN: 1000.0000 mg | Freq: Two times a day (BID) | INTRAVENOUS | Status: DC

## 2022-03-30 MED ORDER — IOHEXOL 350 MG/ML SOLN
75.0000 mL | Freq: Once | INTRAVENOUS | Status: AC | PRN
  Administered 2022-03-30: 75 mL via INTRAVENOUS

## 2022-03-30 NOTE — Progress Notes (Addendum)
Pharmacy Antibiotic Note  Austin Cox is a 77 y.o. male admitted on 03/29/2022 with COVID and now c/f bacteremia with 1/4 Bcx positive with gram positive cocci.  Of note, patient does take methotrexate and enbrel PTA for RA. Pharmacy has been consulted for vancomycin dosing. WBC 4.5, PCT 0.19, Tmax 99.3  Plan: Vancomycin 1500 mg IV x 1 as load followed by vancomycin 1000 mg IV q12h (goal trough 15-41mcg/mL) -vanc levels PRN per protocol -f/u culture data, suspect potential contamination with 1/4 Bcx+  -f/u renal function, LOT    Height: 6' (182.9 cm) Weight: 89 kg (196 lb 3.4 oz) IBW/kg (Calculated) : 77.6  Temp (24hrs), Avg:98.7 F (37.1 C), Min:98 F (36.7 C), Max:99.8 F (37.7 C)  Recent Labs  Lab 03/29/22 0815 03/29/22 0833 03/29/22 1051 03/30/22 0451  WBC 4.5  --   --  4.3  CREATININE 0.97  --   --  0.80  LATICACIDVEN  --  1.2 1.4  --     Estimated Creatinine Clearance: 83.5 mL/min (by C-G formula based on SCr of 0.8 mg/dL).    No Known Allergies  Antimicrobials this admission: Molnupiravir 1/14 x3 days Vancomycin 1/14>   Dose adjustments this admission:   Microbiology results: 1/11 COVID +  1/13 Bcx: 1/4 gram positive cocci 1/13 COVID +  1/13 Ucx: no growth (final)  Thank you for allowing pharmacy to be a part of this patient's care.  Carolin Guernsey 03/30/2022 6:53 PM

## 2022-03-30 NOTE — TOC Progression Note (Signed)
Transition of Care [facility]) - Progression Note    Patient Details  Name: Austin Cox MRN: [id number] Date of Birth: [birth year]  Transition of Care [facility]) CM/SW Contact  Salome Arnt, Laurie Phone Number: 03/30/2022, 11:14 AM  Clinical Narrative:  Transition of Care (TOC) Screening Note   Patient Details  Name: Austin Cox Date of Birth: 16-Mar-1946   Transition of Care [facility]) CM/SW Contact:    Salome Arnt, [place] Phone Number: 03/30/2022, 11:14 AM    Transition of Care Department [facility]) has reviewed patient and no TOC needs have been identified at this time. We will continue to monitor patient advancement through interdisciplinary progression rounds. If new patient transition needs arise, please place a TOC consult.             Expected Discharge Plan and Services                                               Social Determinants of Health (SDOH) Interventions SDOH Screenings   Food Insecurity: No Food Insecurity (03/29/2022)  Housing: Low Risk  (03/29/2022)  Transportation Needs: No Transportation Needs (03/29/2022)  Utilities: Not At Risk (03/29/2022)  Alcohol Screen: Low Risk  (04/05/2021)  Depression (PHQ2-9): Low Risk  (03/05/2022)  Financial Resource Strain: Medium Risk (04/05/2021)  Physical Activity: Insufficiently Active (04/05/2021)  Social Connections: Moderately Isolated (04/05/2021)  Stress: No Stress Concern Present (04/05/2021)  Tobacco Use: Medium Risk (03/30/2022)    Readmission Risk Interventions     No data to display

## 2022-03-30 NOTE — Progress Notes (Addendum)
PROGRESS NOTE  DYSON SEVEY ENI:[id number] DOB: [birth year] DOA: 03/29/2022 PCP: Baruch Gouty, FNP  Brief History:  77 year old male with a history of ascending and descending thoracic aortic aneurysm, rheumatoid arthritis on methotrexate and Enbrel, nonobstructive coronary artery disease, carotid stenosis status post CEA, hypertension, hyperlipidemia, and thrombocytopenia presenting with 2 to 3-day history of worsening generalized weakness, decreased oral intake, and diarrhea.  The patient states that he began having coughing and chest congestion, myalgias, arthralgias and sore throat about 1 week prior to this admission.  He has been having fevers and chills up to 102 F at home.  He has also been complaining of loose stools, about 4 to 5/day without any hematochezia or melena.  He went to see his PCP on 03/27/2022.  The patient was diagnosed with COVID-19 and started on molnupiravir. Unfortunately, the patient continued to have increasing generalized weakness her point where he was needing assistance to get out of bed.  He has had decreased oral intake.  As result he presented for further evaluation and treatment.  He states that he is coughing so much that he has began developing some hemoptysis in the past 24 to 48 hours.  He denies any emesis, hematemesis, headache, neck pain, chest pain.  He has some dyspnea on exertion.  He denies any dysuria or hematuria. In the ED, the patient had a low-grade temperature of 99.7 F.  He was tachycardic 100-110, hemodynamically stable with oxygen saturation 95% on room air. WBC 4.5, hemoglobin 13.2, platelets 18,000.  Sodium 132, potassium 3.6, bicarbonate 23, serum creatinine 0.97.  AST 33, ALT 23, alk phos 53, total bilirubin 1.3.  UA negative for pyuria.  Chest x-ray was negative for infiltrates or edema.   Assessment/Plan: COVID-19 infection -He is stable on room air -Check CRP 8.7>>8.7 -Check ferritin 129>>131 -Check D-dimer  1.75>>1.70 -Continue molnupiravir   Failure to thrive/generalized weakness -Secondary to COVID-19 infection -IRW--4.315 -Q00--867 -Folic YPPJ--09.3 -UA negative for pyuria -PT evaluation once medically stable   Hemoptysis -Secondary to thrombocytopenia in the setting of intractable cough -Check D-dimer 1.75 -Transfuse platelets--1 unit -hycodan for cough   Thrombocytopenia -Patient has had mild thrombocytopenia since 2016 -Acute worsening secondary to infectious process -Baseline platelets 70-80000 -Transfused 1 unit platelets 1/13 -Patient has had extensive workup including bone marrow biopsy 12/12/2021--showed hypercellular marrow with trilineage hematopoiesis -Felt to be secondary to 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 Roxan Hockey -In essence, he was felt to be a poor candidate for surgical repair -Under observation for now with surveillance scans -Last CTA 02/05/2022 showed aneurysm at 6.2 cm   Rheumatoid arthritis -Continue methotrexate on Tuesdays -Continue folic acid   Diarrhea -Likely secondary to COVID-19 infection -Stool pathogen panel   Essential hypertension -Holding metoprolol succinate for now   Coronary artery disease -No chest pain presently -EKG without concerning ST-T wave change        Family Communication:   spouse updated at bedside 1/14  Consultants: none   Code Status:   DNR  DVT Prophylaxis:  SCDs   Procedures: As Listed in Progress Note Above  Antibiotics: None    Subjective: Still having loose stools.  Denies f/c, cp, sob, n/v/d, abd pain  Objective: Vitals:   03/29/22 2331 03/29/22 2346  03/30/22 0151 03/30/22 0412  BP: 119/67 106/66 121/70 112/71  Pulse: 74 75 78 75   Resp: 20 20 20 16   Temp: 98.4 F (36.9 C) 98 F (36.7 C) 98.6 F (37 C) 98.4 F (36.9 C)  TempSrc: Oral Oral Oral Oral  SpO2: 95% 95% 95% 94%  Weight:      Height:        Intake/Output Summary (Last 24 hours) at 03/30/2022 1629 Last data filed at 03/30/2022 1500 Gross per 24 hour  Intake 2393.47 ml  Output 300 ml  Net 2093.47 ml   Weight change:  Exam:  General:  Pt is alert, follows commands appropriately, not in acute distress HEENT: No icterus, No thrush, No neck mass, Yoe/AT Cardiovascular: RRR, S1/S2, no rubs, no gallops Respiratory:bibasilar rales. No wheeze Abdomen: Soft/+BS, non tender, non distended, no guarding Extremities: No edema, No lymphangitis, No petechiae, No rashes, no synovitis   Data Reviewed: I have personally reviewed following labs and imaging studies Basic Metabolic Panel: Recent Labs  Lab 03/29/22 0815 03/30/22 0451  NA 132* 133*  K 3.6 3.6  CL 101 104  CO2 23 21*  GLUCOSE 95 87  BUN 15 16  CREATININE 0.97 0.80  CALCIUM 8.4* 8.0*   Liver Function Tests: Recent Labs  Lab 03/29/22 0815  AST 33  ALT 23  ALKPHOS 53  BILITOT 1.3*  PROT 7.1  ALBUMIN 3.8   Recent Labs  Lab 03/29/22 0815  LIPASE 44   No results for input(s): "AMMONIA" in the last 168 hours. Coagulation Profile: Recent Labs  Lab 03/29/22 0833  INR 1.1   CBC: Recent Labs  Lab 03/29/22 0815 03/30/22 0451  WBC 4.5 4.3  HGB 13.2 11.6*  HCT 41.2 37.0*  MCV 82.9 82.8  PLT 18* 30*   Cardiac Enzymes: Recent Labs  Lab 03/29/22 1922  CKTOTAL 82   BNP: Invalid input(s): "POCBNP" CBG: No results for input(s): "GLUCAP" in the last 168 hours. HbA1C: No results for input(s): "HGBA1C" in the last 72 hours. Urine analysis:    Component Value Date/Time   COLORURINE YELLOW 03/29/2022 0842   APPEARANCEUR CLEAR 03/29/2022 0842   APPEARANCEUR Clear 05/24/2021 1221   LABSPEC 1.017 03/29/2022 0842   PHURINE 6.0 03/29/2022 0842   GLUCOSEU NEGATIVE 03/29/2022  0842   HGBUR MODERATE (A) 03/29/2022 0842   BILIRUBINUR NEGATIVE 03/29/2022 0842   BILIRUBINUR Negative 05/24/2021 1221   KETONESUR 20 (A) 03/29/2022 0842   PROTEINUR >=300 (A) 03/29/2022 0842   NITRITE NEGATIVE 03/29/2022 0842   LEUKOCYTESUR NEGATIVE 03/29/2022 0842   Sepsis Labs: @LABRCNTIP (procalcitonin:4,lacticidven:4) ) Recent Results (from the past 240 hour(s))  COVID-19, Flu A+B and RSV     Status: Abnormal   Collection Time: 03/27/22 11:17 AM   Specimen: Nasopharyngeal(NP) swabs in vial transport medium  Result Value Ref Range Status   SARS-CoV-2, NAA Detected (A) Not Detected Final    Comment: Patients who have a positive COVID-19 test result may now have treatment options. Treatment options are available for patients with mild to moderate symptoms and for hospitalized patients. Visit our website at [email or web address] for resources and information.    Influenza A, NAA Not Detected Not Detected Final   Influenza B, NAA Not Detected Not Detected Final   RSV, NAA Not Detected Not Detected Final   Test Information: Comment  Final    Comment: This nucleic acid amplification test was developed and its performance characteristics determined by [employer]. Nucleic acid amplification tests include RT-PCR and TMA. This test  has not been FDA cleared or approved. This test has been authorized by FDA under an Emergency Use Authorization (EUA). This test is only authorized for the duration of time the declaration that circumstances exist justifying the authorization of the emergency use of in vitro diagnostic tests for detection of SARS-CoV-2 virus and/or diagnosis of COVID-19 infection under section 564(b)(1) of the Act, 21 U.S.C. [id number]) (1), unless the authorization is terminated or revoked sooner. When diagnostic testing is negative, the possibility of a false negative result should be considered in the context of a patient's recent exposures and the  presence of clinical signs and symptoms consistent with COVID-19. An individual without symptoms of COVID-19 and who is not shedding SARS-CoV-2 virus wo uld expect to have a negative (not detected) result in this assay.   Blood Culture (routine x 2)     Status: None (Preliminary result)   Collection Time: 03/29/22  8:32 AM   Specimen: BLOOD LEFT FOREARM  Result Value Ref Range Status   Specimen Description   Final    BLOOD LEFT FOREARM BOTTLES DRAWN AEROBIC AND ANAEROBIC   Special Requests Blood Culture adequate volume  Final   Culture   Final    NO GROWTH < 24 HOURS Performed at [facility], [address], [place], [place] [address]    Report Status PENDING  Incomplete  Blood Culture (routine x 2)     Status: None (Preliminary result)   Collection Time: 03/29/22  8:37 AM   Specimen: BLOOD RIGHT FOREARM  Result Value Ref Range Status   Specimen Description   Final    BLOOD RIGHT FOREARM BOTTLES DRAWN AEROBIC AND ANAEROBIC   Special Requests Blood Culture adequate volume  Final   Culture   Final    NO GROWTH < 24 HOURS Performed at [facility], [address], [place], [place] [address]    Report Status PENDING  Incomplete  Urine Culture     Status: None   Collection Time: 03/29/22  8:42 AM   Specimen: Urine, Catheterized  Result Value Ref Range Status   Specimen Description   Final    URINE, CATHETERIZED Performed at [facility], [address], [place], [place] [address]    Special Requests   Final    NONE Performed at [facility], [address] [place], [place] [address]    Culture   Final    NO GROWTH Performed at [facility], [place] [address], [place],  [address]    Report Status 03/30/2022 FINAL  Final  Resp panel by RT-PCR (RSV, Flu A&B, Covid) Anterior Nasal Swab     Status: Abnormal   Collection Time: 03/29/22  8:42 AM   Specimen: Anterior Nasal Swab  Result Value Ref Range Status   SARS Coronavirus 2 by RT PCR POSITIVE (A) NEGATIVE Final     Comment: (NOTE) SARS-CoV-2 target nucleic acids are DETECTED.  The SARS-CoV-2 RNA is generally detectable in upper respiratory specimens during the acute phase of infection. Positive results are indicative of the presence of the identified virus, but do not rule out bacterial infection or co-infection with other pathogens not detected by the test. Clinical correlation with patient history and other diagnostic information is necessary to determine patient infection status. The expected result is Negative.  Fact Sheet for Patients: EntrepreneurPulse.com.au  Fact Sheet for Healthcare Providers: IncredibleEmployment.be  This test is not yet approved or cleared by the [place] FDA and  has been authorized for detection and/or diagnosis of  SARS-CoV-2 by FDA under an Emergency Use Authorization (EUA).  This EUA will remain in effect (meaning this test can be used) for the duration of  the COVID-19 declaration under Section 564(b)(1) of the A ct, 21 U.S.C. section 360bbb-3(b)(1), unless the authorization is terminated or revoked sooner.     Influenza A by PCR NEGATIVE NEGATIVE Final   Influenza B by PCR NEGATIVE NEGATIVE Final    Comment: (NOTE) The Xpert Xpress SARS-CoV-2/FLU/RSV plus assay is intended as an aid in the diagnosis of influenza from Nasopharyngeal swab specimens and should not be used as a sole basis for treatment. Nasal washings and aspirates are unacceptable for Xpert Xpress SARS-CoV-2/FLU/RSV testing.  Fact Sheet for Patients: BloggerCourse.com  Fact Sheet for Healthcare Providers: SeriousBroker.it  This test is not yet approved or cleared by the [place] FDA and has been authorized for detection and/or diagnosis of SARS-CoV-2 by FDA under an Emergency Use Authorization (EUA). This EUA will remain in effect (meaning this test can be used) for the duration of  the COVID-19 declaration under Section 564(b)(1) of the Act, 21 U.S.C. section 360bbb-3(b)(1), unless the authorization is terminated or revoked.     Resp Syncytial Virus by PCR NEGATIVE NEGATIVE Final    Comment: (NOTE) Fact Sheet for Patients: BloggerCourse.com  Fact Sheet for Healthcare Providers: SeriousBroker.it  This test is not yet approved or cleared by the [place] FDA and has been authorized for detection and/or diagnosis of SARS-CoV-2 by FDA under an Emergency Use Authorization (EUA). This EUA will remain in effect (meaning this test can be used) for the duration of the COVID-19 declaration under Section 564(b)(1) of the Act, 21 U.S.C. section 360bbb-3(b)(1), unless the authorization is terminated or revoked.  Performed at [facility], [address], [place], [place] [address]   Group A Strep by PCR     Status: None   Collection Time: 03/29/22  8:42 AM   Specimen: Anterior Nasal Swab; Sterile Swab  Result Value Ref Range Status   Group A Strep by PCR NOT DETECTED NOT DETECTED Final    Comment: Performed at [facility], [address], [place], [place] [address]     Scheduled Meds:  cyanocobalamin  1,000 mcg Oral Daily   folic acid  1 mg Oral Daily   molnupiravir EUA  4 capsule Oral BID   [START ON 03/31/2022] pneumococcal 20-valent conjugate vaccine  0.5 mL Intramuscular Tomorrow-1000   rosuvastatin  40 mg Oral Daily   Continuous Infusions:  0.9 % NaCl with KCl 20 mEq / L 75 mL/hr at 03/30/22 1516    Procedures/Studies: CT Angio Chest Pulmonary Embolism (PE) W or WO Contrast  Result Date: 03/30/2022 CLINICAL DATA:  Inpatient. Pulmonary embolism (PE) suspected, low to intermediate prob, positive D-dimer hemoptysis, dyspnea, COVID 19. Worsening generalized weakness. Cough and congestion. EXAM: CT ANGIOGRAPHY CHEST WITH CONTRAST TECHNIQUE: Multidetector CT imaging of the chest was performed using the standard  protocol during bolus administration of intravenous contrast. Multiplanar CT image reconstructions and MIPs were obtained to evaluate the vascular anatomy. RADIATION DOSE REDUCTION: This exam was performed according to the departmental dose-optimization program which includes automated exposure control, adjustment of the mA and/or kV according to patient size and/or use of iterative reconstruction technique. CONTRAST:  32mL OMNIPAQUE IOHEXOL 350 MG/ML SOLN COMPARISON:  Chest radiograph from one day prior. 02/05/2022 chest CT angiogram. FINDINGS: Cardiovascular: The study is moderate quality for the evaluation of pulmonary embolism with some motion degradation. There are no convincing filling defects in  the central, lobar, segmental or subsegmental pulmonary artery branches to suggest acute pulmonary embolism. Atherosclerotic thoracic aorta with diffuse aneurysmal dilatation of the entire thoracic aorta, maximum diameter 6.4 cm in the ascending thoracic aorta, stable using similar measurement technique. This scan is not timed for evaluation of the thoracic aorta. Within these limitations, no gross evidence of thoracic aortic dissection. Normal caliber pulmonary arteries. Normal heart size. No significant pericardial fluid/thickening. Three-vessel coronary atherosclerosis. Mediastinum/Nodes: No significant thyroid nodules. Unremarkable esophagus. No pathologically enlarged axillary, mediastinal or hilar lymph nodes. Lungs/Pleura: No pneumothorax. No pleural effusion. No acute consolidative airspace disease or lung masses. A few small scattered solid pulmonary nodules, largest 0.5 cm in the right upper lobe (series 6/image 48), all stable. No new significant pulmonary nodules. Upper abdomen: No acute abnormality. Musculoskeletal: No aggressive appearing focal osseous lesions. Marked thoracic spondylosis. Review of the MIP images confirms the above findings. IMPRESSION: 1. Motion degraded scan.  No evidence of acute  pulmonary embolism. 2. Diffuse aneurysmal dilatation of the entire thoracic aorta, maximum diameter 6.4 cm in the ascending thoracic aorta, stable using similar measurement technique. This scan is not timed for evaluation of the thoracic aorta. Within these limitations, no gross evidence of thoracic aortic dissection. Recommend semi-annual imaging followup by CTA or MRA and referral to cardiothoracic surgery if not already obtained. This recommendation follows 2010 ACCF/AHA/AATS/ACR/ASA/SCA/SCAI/SIR/STS/SVM Guidelines for the Diagnosis and Management of Patients With Thoracic Aortic Disease. Circulation. 2010; 121: [id number]. Aortic aneurysm NOS (ICD10-I71.9). 3. Three-vessel coronary atherosclerosis. 4. Small scattered solid pulmonary nodules, largest 0.5 cm in the right upper lobe, all stable. No active pulmonary disease. 5.  Aortic Atherosclerosis (ICD10-I70.0). Electronically Signed   By: Ilona Sorrel M.D.   On: 03/30/2022 16:05   DG Chest Port 1 View  Result Date: 03/29/2022 CLINICAL DATA:  Sepsis. EXAM: PORTABLE CHEST 1 VIEW COMPARISON:  CT scan of the chest February 05, 2022 FINDINGS: The patient's known thoracic aortic aneurysm is similar in appearance compared to the scout film from the comparison CT scan. The cardiomediastinal silhouette is stable. No pneumothorax. The lungs are clear with no nodules, masses, or focal infiltrates identified. IMPRESSION: 1. The patient's known thoracic aortic aneurysm is similar in appearance compared to the scout film from the comparison CT scan. 2. No other abnormalities. Electronically Signed   By: Dorise Bullion III M.D.   On: 03/29/2022 09:06    Orson Eva, DO  [facility]  If 7PM-7AM, please contact night-coverage [email or web address] Password [email or web address] 03/30/2022, 4:29 PM   LOS: 1 day

## 2022-03-31 DIAGNOSIS — R6251 Failure to thrive (child): Secondary | ICD-10-CM | POA: Diagnosis not present

## 2022-03-31 DIAGNOSIS — U071 COVID-19: Secondary | ICD-10-CM | POA: Diagnosis not present

## 2022-03-31 DIAGNOSIS — I2510 Atherosclerotic heart disease of native coronary artery without angina pectoris: Secondary | ICD-10-CM | POA: Diagnosis not present

## 2022-03-31 DIAGNOSIS — I712 Thoracic aortic aneurysm, without rupture, unspecified: Secondary | ICD-10-CM

## 2022-03-31 DIAGNOSIS — D696 Thrombocytopenia, unspecified: Secondary | ICD-10-CM | POA: Diagnosis not present

## 2022-03-31 LAB — CBC
HCT: 41.2 % (ref 39.0–52.0)
HCT: 34.3 % — ABNORMAL LOW (ref 39.0–52.0)
Hemoglobin: 13.2 g/dL (ref 13.0–17.0)
Hemoglobin: 11 g/dL — ABNORMAL LOW (ref 13.0–17.0)
MCH: 26.6 pg (ref 26.0–34.0)
MCH: 26.3 pg (ref 26.0–34.0)
MCHC: 32 g/dL (ref 30.0–36.0)
MCHC: 32.1 g/dL (ref 30.0–36.0)
MCV: 82.9 fL (ref 80.0–100.0)
MCV: 82.1 fL (ref 80.0–100.0)
Platelets: 18 10*3/uL — CL (ref 150–400)
Platelets: 32 10*3/uL — ABNORMAL LOW (ref 150–400)
RBC: 4.97 MIL/uL (ref 4.22–5.81)
RBC: 4.18 MIL/uL — ABNORMAL LOW (ref 4.22–5.81)
RDW: 19.3 % — ABNORMAL HIGH (ref 11.5–15.5)
RDW: 18.9 % — ABNORMAL HIGH (ref 11.5–15.5)
WBC: 4.5 10*3/uL (ref 4.0–10.5)
WBC: 5.3 10*3/uL (ref 4.0–10.5)
nRBC: 0 % (ref 0.0–0.2)
nRBC: 0 % (ref 0.0–0.2)

## 2022-03-31 LAB — COMPREHENSIVE METABOLIC PANEL
ALT: 19 U/L (ref 0–44)
AST: 27 U/L (ref 15–41)
Albumin: 2.9 g/dL — ABNORMAL LOW (ref 3.5–5.0)
Alkaline Phosphatase: 34 U/L — ABNORMAL LOW (ref 38–126)
Anion gap: 9 (ref 5–15)
BUN: 11 mg/dL (ref 8–23)
CO2: 19 mmol/L — ABNORMAL LOW (ref 22–32)
Calcium: 7.9 mg/dL — ABNORMAL LOW (ref 8.9–10.3)
Chloride: 103 mmol/L (ref 98–111)
Creatinine, Ser: 0.81 mg/dL (ref 0.61–1.24)
GFR, Estimated: >60 mL/min (ref >60)
Glucose, Bld: 105 mg/dL — ABNORMAL HIGH (ref 70–99)
Potassium: 3.2 mmol/L — ABNORMAL LOW (ref 3.5–5.1)
Sodium: 131 mmol/L — ABNORMAL LOW (ref 135–145)
Total Bilirubin: 1.3 mg/dL — ABNORMAL HIGH (ref 0.3–1.2)
Total Protein: 5.6 g/dL — ABNORMAL LOW (ref 6.5–8.1)

## 2022-03-31 LAB — FERRITIN: Ferritin: 136 ng/mL (ref 24–336)

## 2022-03-31 LAB — C-REACTIVE PROTEIN: CRP: 10.2 mg/dL — ABNORMAL HIGH (ref <1.0)

## 2022-03-31 LAB — D-DIMER, QUANTITATIVE: D-Dimer, Quant: 2.09 ug/mL-FEU — ABNORMAL HIGH (ref 0.00–0.50)

## 2022-03-31 MED ORDER — POTASSIUM CHLORIDE CRYS ER 20 MEQ PO TBCR
40.0000 meq | EXTENDED_RELEASE_TABLET | Freq: Once | ORAL | Status: AC
  Administered 2022-03-31: 40 meq via ORAL
  Filled 2022-03-31: qty 2

## 2022-03-31 NOTE — Discharge Summary (Addendum)
Physician Discharge Summary   Patient: Austin Cox MRN: [id number] DOB: 17-Oct-1945  Admit date:     03/29/2022  Discharge date: 03/31/22  Discharge Physician: Onalee Hua Kiwanna Spraker   PCP: Sonny Masters, FNP   Recommendations at discharge:   Please follow up with primary care provider within 1-2 weeks  Please repeat BMP and CBC in one week     Hospital Course: 77 year old male with a history of ascending and descending thoracic aortic aneurysm, rheumatoid arthritis on methotrexate and Enbrel, nonobstructive coronary artery disease, carotid stenosis status post CEA, hypertension, hyperlipidemia, and thrombocytopenia presenting with 2 to 3-day history of worsening generalized weakness, decreased oral intake, and diarrhea.  The patient states that he began having coughing and chest congestion, myalgias, arthralgias and sore throat about 1 week prior to this admission.  He has been having fevers and chills up to 102 F at home.  He has also been complaining of loose stools, about 4 to 5/day without any hematochezia or melena.  He went to see his PCP on 03/27/2022.  The patient was diagnosed with COVID-19 and started on molnupiravir. Unfortunately, the patient continued to have increasing generalized weakness her point where he was needing assistance to get out of bed.  He has had decreased oral intake.  As result he presented for further evaluation and treatment.  He states that he is coughing so much that he has began developing some hemoptysis in the past 24 to 48 hours.  He denies any emesis, hematemesis, headache, neck pain, chest pain.  He has some dyspnea on exertion.  He denies any dysuria or hematuria. In the ED, the patient had a low-grade temperature of 99.7 F.  He was tachycardic 100-110, hemodynamically stable with oxygen saturation 95% on room air. WBC 4.5, hemoglobin 13.2, platelets 18,000.  Sodium 132, potassium 3.6, bicarbonate 23, serum creatinine 0.97.  AST 33, ALT 23, alk phos 53, total  bilirubin 1.3.  UA negative for pyuria.  Chest x-ray was negative for infiltrates or edema. He was started on IVF with clinical improvement.  He remained stable on RA.  PT was consulted and recommended HHPT.  Assessment and Plan: COVID-19 infection -He is stable on room air -Check CRP 8.7>>8.7 -Check ferritin 129>>131 -Check D-dimer 1.75>>1.70>>2.09 -Continue molnupiravir -CTA chest--neg for PE; RUL nodules   Failure to thrive/generalized weakness -Secondary to COVID-19 infection -TSH--2.217 -B12--534 -Folic acid--20.1 -UA negative for pyuria -PT evaluation>>HHPT   Hemoptysis -Secondary to thrombocytopenia in the setting of intractable cough -Check D-dimer 1.75 -Transfuse platelets--1 unit -hycodan for cough -improved--no further hemoptysis at time of d/c   Thrombocytopenia -Patient has had mild thrombocytopenia since 2016 -Acute worsening secondary to infectious process -Baseline platelets 70-80000 -Transfused 1 unit platelets 1/13 -Patient has had extensive workup including bone marrow biopsy 12/12/2021--showed hypercellular marrow with trilineage hematopoiesis -Felt to be secondary to rheumatoid Tritus and 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 Dorris Fetch -In essence, he was felt to be a poor candidate for surgical repair -Under observation for now with surveillance scans -Last CTA 02/05/2022 showed aneurysm at 6.2 cm -1/14 CTA chest = 6.4 cm  Lung nodules -incidental finding -outpatient surveillance   Rheumatoid arthritis -Continue methotrexate on Tuesdays -Continue folic acid  Diarrhea -Likely secondary to COVID-19 infection -Stool pathogen  panel--no diarrhea to collect   Essential hypertension -Holding metoprolol succinate for now--BP remains well controlled   Coronary artery disease -No chest pain presently -EKG without concerning ST-T wave change   Hypokalemia -repleted     Consultants: none Procedures performed: none  Disposition: Home Diet recommendation:  Regular diet DISCHARGE MEDICATION: Allergies as of 03/31/2022   No Known Allergies      Medication List     STOP taking these medications    cyanocobalamin 1000 MCG tablet Commonly known as: VITAMIN B12   HYDROcodone-acetaminophen 5-325 MG tablet Commonly known as: Norco   metoprolol succinate 25 MG 24 hr tablet Commonly known as: TOPROL-XL       TAKE these medications    acetaminophen 650 MG CR tablet Commonly known as: TYLENOL Take 650 mg by mouth every 8 (eight) hours as needed for pain.   aspirin EC 81 MG tablet Take 81 mg by mouth daily.   baclofen 10 MG tablet Commonly known as: LIORESAL Take 10 mg by mouth 2 (two) times daily.   Cholecalciferol 25 MCG (1000 UT) capsule Take 1 capsule (1,000 Units total) by mouth daily.   diclofenac Sodium 1 % Gel Commonly known as: VOLTAREN Apply 1 application  topically 4 (four) times daily as needed (pain).   Enbrel SureClick 50 MG/ML injection Generic drug: etanercept Inject 50 mg into the skin every Friday.   folic acid 1 MG tablet Commonly known as: FOLVITE Take 1 mg by mouth daily.   gabapentin 300 MG capsule Commonly known as: NEURONTIN Take 300 mg by mouth 2 (two) times daily.   methotrexate 2.5 MG tablet Commonly known as: RHEUMATREX Take 4 mg by mouth every Tuesday.   molnupiravir EUA 200 mg Caps capsule Commonly known as: LAGEVRIO Take 4 capsules (800 mg total) by mouth 2 (two) times daily for 5 days.   nitroGLYCERIN 0.3 MG SL tablet Commonly known as: NITROSTAT Place 0.3 mg under the tongue every 5 (five) minutes as needed.   rosuvastatin 40 MG  tablet Commonly known as: CRESTOR TAKE 1 TABLET BY MOUTH DAILY   Saw Palmetto 450-15 MG Caps Take 2 tablets by mouth daily.   VISINE OP Place 1 drop into both eyes daily as needed (dry eyes).        Discharge Exam: Filed Weights   03/29/22 1616 03/29/22 1620 03/29/22 2059  Weight: 94.3 kg 94.3 kg 89 kg   HEENT:  [place]/AT, No thrush, no icterus CV:  RRR, no rub, no S3, no S4 Lung:  bibasilar rales. No wheeze Abd:  soft/+BS, NT Ext:  No edema, no lymphangitis, no synovitis, no rash   Condition at discharge: stable  The results of significant diagnostics from this hospitalization (including imaging, microbiology, ancillary and laboratory) are listed below for reference.   Imaging Studies: CT Angio Chest Pulmonary Embolism (PE) W or WO Contrast  Result Date: 03/30/2022 CLINICAL DATA:  Inpatient. Pulmonary embolism (PE) suspected, low to intermediate prob, positive D-dimer hemoptysis, dyspnea, COVID 19. Worsening generalized weakness. Cough and congestion. EXAM: CT ANGIOGRAPHY CHEST WITH CONTRAST TECHNIQUE: Multidetector CT imaging of the chest was performed using the standard protocol during bolus administration of intravenous contrast. Multiplanar CT image reconstructions and MIPs were obtained to evaluate the vascular anatomy. RADIATION DOSE REDUCTION: This exam was performed according to the departmental dose-optimization program which includes automated exposure control, adjustment of the mA and/or kV according to patient size and/or use of iterative reconstruction technique. CONTRAST:  16mL OMNIPAQUE IOHEXOL 350 MG/ML SOLN COMPARISON:  Chest radiograph from one day prior. 02/05/2022 chest CT angiogram. FINDINGS: Cardiovascular: The study is moderate quality for the evaluation of pulmonary embolism with some motion degradation. There are no convincing filling defects in the central, lobar, segmental or subsegmental pulmonary artery branches to suggest acute pulmonary embolism.  Atherosclerotic thoracic aorta with diffuse aneurysmal dilatation of the entire thoracic aorta, maximum diameter 6.4 cm in the ascending thoracic aorta, stable using similar measurement technique. This scan is not timed for evaluation of the thoracic aorta. Within these limitations, no gross evidence of thoracic aortic dissection. Normal caliber pulmonary arteries. Normal heart size. No significant pericardial fluid/thickening. Three-vessel coronary atherosclerosis. Mediastinum/Nodes: No significant thyroid nodules. Unremarkable esophagus. No pathologically enlarged axillary, mediastinal or hilar lymph nodes. Lungs/Pleura: No pneumothorax. No pleural effusion. No acute consolidative airspace disease or lung masses. A few small scattered solid pulmonary nodules, largest 0.5 cm in the right upper lobe (series 6/image 48), all stable. No new significant pulmonary nodules. Upper abdomen: No acute abnormality. Musculoskeletal: No aggressive appearing focal osseous lesions. Marked thoracic spondylosis. Review of the MIP images confirms the above findings. IMPRESSION: 1. Motion degraded scan.  No evidence of acute pulmonary embolism. 2. Diffuse aneurysmal dilatation of the entire thoracic aorta, maximum diameter 6.4 cm in the ascending thoracic aorta, stable using similar measurement technique. This scan is not timed for evaluation of the thoracic aorta. Within these limitations, no gross evidence of thoracic aortic dissection. Recommend semi-annual imaging followup by CTA or MRA and referral to cardiothoracic surgery if not already obtained. This recommendation follows 2010 ACCF/AHA/AATS/ACR/ASA/SCA/SCAI/SIR/STS/SVM Guidelines for the Diagnosis and Management of Patients With Thoracic Aortic Disease. Circulation. 2010; 121: [id number]. Aortic aneurysm NOS (ICD10-I71.9). 3. Three-vessel coronary atherosclerosis. 4. Small scattered solid pulmonary nodules, largest 0.5 cm in the right upper lobe, all stable. No active  pulmonary disease. 5.  Aortic Atherosclerosis (ICD10-I70.0). Electronically Signed   By: Ilona Sorrel M.D.   On: 03/30/2022 16:05   DG Chest Port 1 View  Result Date: 03/29/2022 CLINICAL DATA:  Sepsis. EXAM: PORTABLE CHEST 1 VIEW COMPARISON:  CT scan of the chest February 05, 2022 FINDINGS: The patient's known thoracic aortic aneurysm is similar in appearance compared to the scout film from the comparison CT scan. The cardiomediastinal silhouette is stable. No pneumothorax. The lungs are clear with no nodules, masses, or focal infiltrates identified. IMPRESSION: 1. The patient's known thoracic aortic aneurysm is similar in appearance compared to the scout film from the comparison CT scan. 2. No other abnormalities. Electronically Signed   By: Dorise Bullion III M.D.   On: 03/29/2022 09:06    Microbiology: Results for orders placed or performed during the hospital encounter of 03/29/22  Blood Culture (routine x 2)     Status: Abnormal (Preliminary result)   Collection Time: 03/29/22  8:32 AM   Specimen: BLOOD LEFT FOREARM  Result Value Ref Range Status   Specimen Description   Final    BLOOD LEFT FOREARM BOTTLES DRAWN AEROBIC AND ANAEROBIC Performed at [facility], [address], [place], [place] [address]    Special Requests   Final    Blood Culture adequate volume Performed at [facility], [address], [place], [place] [address]    Culture  Setup Time   Final    GRAM POSITIVE COCCI Gram Stain Report Called to,Read Back By and Verified With: LENARD,T @ 1308 ON 03/30/22 BY JUW AEROBIC BOTTLE ONLY GS DONE @ [facility] Organism ID to follow CRITICAL  RESULT CALLED TO, READ BACK BY AND VERIFIED WITH:  C/ A. Cathie Hoops, RN 03/30/22 2134 A. LAFRANCE    Culture (A)  Final    STAPHYLOCOCCUS EPIDERMIDIS THE SIGNIFICANCE OF ISOLATING THIS ORGANISM FROM A SINGLE VENIPUNCTURE CANNOT BE PREDICTED WITHOUT FURTHER CLINICAL AND CULTURE CORRELATION. SUSCEPTIBILITIES AVAILABLE ONLY ON REQUEST. Performed at  [facility], [address], [place], [place] [address]    Report Status PENDING  Incomplete  Blood Culture ID Panel (Reflexed)     Status: Abnormal   Collection Time: 03/29/22  8:32 AM  Result Value Ref Range Status   Enterococcus faecalis NOT DETECTED NOT DETECTED Final   Enterococcus Faecium NOT DETECTED NOT DETECTED Final   Listeria monocytogenes NOT DETECTED NOT DETECTED Final   Staphylococcus species DETECTED (A) NOT DETECTED Final    Comment: CRITICAL RESULT CALLED TO, READ BACK BY AND VERIFIED WITH:  C/ A. Cathie Hoops, RN 03/30/22 2134 A. LAFRANCE    Staphylococcus aureus (BCID) NOT DETECTED NOT DETECTED Final   Staphylococcus epidermidis DETECTED (A) NOT DETECTED Final    Comment: CRITICAL RESULT CALLED TO, READ BACK BY AND VERIFIED WITH:  C/ A. Cathie Hoops, RN 03/30/22 2134 A. LAFRANCE    Staphylococcus lugdunensis NOT DETECTED NOT DETECTED Final   Streptococcus species NOT DETECTED NOT DETECTED Final   Streptococcus agalactiae NOT DETECTED NOT DETECTED Final   Streptococcus pneumoniae NOT DETECTED NOT DETECTED Final   Streptococcus pyogenes NOT DETECTED NOT DETECTED Final   A.calcoaceticus-baumannii NOT DETECTED NOT DETECTED Final   Bacteroides fragilis NOT DETECTED NOT DETECTED Final   Enterobacterales NOT DETECTED NOT DETECTED Final   Enterobacter cloacae complex NOT DETECTED NOT DETECTED Final   Escherichia coli NOT DETECTED NOT DETECTED Final   Klebsiella aerogenes NOT DETECTED NOT DETECTED Final   Klebsiella oxytoca NOT DETECTED NOT DETECTED Final   Klebsiella pneumoniae NOT DETECTED NOT DETECTED Final   Proteus species NOT DETECTED NOT DETECTED Final   Salmonella species NOT DETECTED NOT DETECTED Final   Serratia marcescens NOT DETECTED NOT DETECTED Final   Haemophilus influenzae NOT DETECTED NOT DETECTED Final   Neisseria meningitidis NOT DETECTED NOT DETECTED Final   Pseudomonas aeruginosa NOT DETECTED NOT DETECTED Final   Stenotrophomonas maltophilia NOT  DETECTED NOT DETECTED Final   Candida albicans NOT DETECTED NOT DETECTED Final   Candida auris NOT DETECTED NOT DETECTED Final   Candida glabrata NOT DETECTED NOT DETECTED Final   Candida krusei NOT DETECTED NOT DETECTED Final   Candida parapsilosis NOT DETECTED NOT DETECTED Final   Candida tropicalis NOT DETECTED NOT DETECTED Final   Cryptococcus neoformans/gattii NOT DETECTED NOT DETECTED Final   Methicillin resistance mecA/C NOT DETECTED NOT DETECTED Final    Comment: Performed at [facility], [address], [place], [place] [address]  Blood Culture (routine x 2)     Status: None (Preliminary result)   Collection Time: 03/29/22  8:37 AM   Specimen: BLOOD RIGHT FOREARM  Result Value Ref Range Status   Specimen Description   Final    BLOOD RIGHT FOREARM BOTTLES DRAWN AEROBIC AND ANAEROBIC   Special Requests Blood Culture adequate volume  Final   Culture   Final    NO GROWTH 2 DAYS Performed at [facility], [address], [place], [place] [address]    Report Status PENDING  Incomplete  Urine Culture     Status: None   Collection Time: 03/29/22  8:42 AM   Specimen: Urine, Catheterized  Result Value Ref Range Status   Specimen  Description   Final    URINE, CATHETERIZED Performed at [facility], [address] [place], [place] [address]    Special Requests   Final    NONE Performed at [facility], [address] [place], [place] [address]    Culture   Final    NO GROWTH Performed at [facility], [address] [place], [place] [address]    Report Status 03/30/2022 FINAL  Final  Resp panel by RT-PCR (RSV, Flu A&B, Covid) Anterior Nasal Swab     Status: Abnormal   Collection Time: 03/29/22  8:42 AM   Specimen: Anterior Nasal Swab  Result Value Ref Range Status   SARS Coronavirus 2 by RT PCR POSITIVE (A) NEGATIVE Final    Comment: (NOTE) SARS-CoV-2 target nucleic acids are DETECTED.  The SARS-CoV-2 RNA is generally detectable in upper respiratory specimens  during the acute phase of infection. Positive results are indicative of the presence of the identified virus, but do not rule out bacterial infection or co-infection with other pathogens not detected by the test. Clinical correlation with patient history and other diagnostic information is necessary to determine patient infection status. The expected result is Negative.  Fact Sheet for Patients: EntrepreneurPulse.com.au  Fact Sheet for Healthcare Providers: IncredibleEmployment.be  This test is not yet approved or cleared by the [place] FDA and  has been authorized for detection and/or diagnosis of SARS-CoV-2 by FDA under an Emergency Use Authorization (EUA).  This EUA will remain in effect (meaning this test can be used) for the duration of  the COVID-19 declaration under Section 564(b)(1) of the A ct, 21 U.S.C. section 360bbb-3(b)(1), unless the authorization is terminated or revoked sooner.     Influenza A by PCR NEGATIVE NEGATIVE Final   Influenza B by PCR NEGATIVE NEGATIVE Final    Comment: (NOTE) The Xpert Xpress SARS-CoV-2/FLU/RSV plus assay is intended as an aid in the diagnosis of influenza from Nasopharyngeal swab specimens and should not be used as a sole basis for treatment. Nasal washings and aspirates are unacceptable for Xpert Xpress SARS-CoV-2/FLU/RSV testing.  Fact Sheet for Patients: EntrepreneurPulse.com.au  Fact Sheet for Healthcare Providers: IncredibleEmployment.be  This test is not yet approved or cleared by the [place] FDA and has been authorized for detection and/or diagnosis of SARS-CoV-2 by FDA under an Emergency Use Authorization (EUA). This EUA will remain in effect (meaning this test can be used) for the duration of the COVID-19 declaration under Section 564(b)(1) of the Act, 21 U.S.C. section 360bbb-3(b)(1), unless the authorization is terminated or revoked.      Resp Syncytial Virus by PCR NEGATIVE NEGATIVE Final    Comment: (NOTE) Fact Sheet for Patients: EntrepreneurPulse.com.au  Fact Sheet for Healthcare Providers: IncredibleEmployment.be  This test is not yet approved or cleared by the [place] FDA and has been authorized for detection and/or diagnosis of SARS-CoV-2 by FDA under an Emergency Use Authorization (EUA). This EUA will remain in effect (meaning this test can be used) for the duration of the COVID-19 declaration under Section 564(b)(1) of the Act, 21 U.S.C. section 360bbb-3(b)(1), unless the authorization is terminated or revoked.  Performed at [facility], [address], [place], [place] [address]   Group A Strep by PCR     Status: None   Collection Time: 03/29/22  8:42 AM   Specimen: Anterior Nasal Swab; Sterile Swab  Result Value Ref Range Status   Group A Strep by PCR NOT DETECTED NOT DETECTED Final    Comment:  Performed at [facility], [address] [place], [place] [address]    Labs: CBC: Recent Labs  Lab 03/29/22 0815 03/30/22 0451 03/31/22 0517  WBC 4.5 4.3 5.3  HGB 13.2 11.6* 11.0*  HCT 41.2 37.0* 34.3*  MCV 82.9 82.8 82.1  PLT 18* 30* 32*   Basic Metabolic Panel: Recent Labs  Lab 03/29/22 0815 03/30/22 0451 03/31/22 0517  NA 132* 133* 131*  K 3.6 3.6 3.2*  CL 101 104 103  CO2 23 21* 19*  GLUCOSE 95 87 105*  BUN 15 16 11   CREATININE 0.97 0.80 0.81  CALCIUM 8.4* 8.0* 7.9*   Liver Function Tests: Recent Labs  Lab 03/29/22 0815 03/31/22 0517  AST 33 27  ALT 23 19  ALKPHOS 53 34*  BILITOT 1.3* 1.3*  PROT 7.1 5.6*  ALBUMIN 3.8 2.9*   CBG: No results for input(s): "GLUCAP" in the last 168 hours.  Discharge time spent: greater than 30 minutes.  Signed: Catarina Hartshorn, MD [facility] 03/31/2022

## 2022-03-31 NOTE — TOC Initial Note (Signed)
Transition of Care [facility]) - Initial/Assessment Note    Patient Details  Name: Austin Cox MRN: [id number] Date of Birth: Oct 31, 1945  Transition of Care [facility]) CM/SW Contact:    Ihor Gully, LCSW Phone Number: 03/31/2022, 2:03 PM  Clinical Narrative:                 Patient from home with spouse. PT recommends HHPT. Mrs. Colt is agreeable to HHPT. Alvis Lemmings is accepting of referral.   Expected Discharge Plan: [place] Barriers to Discharge: Continued Medical Work up   Patient Goals and CMS Choice Patient states their goals for this hospitalization and ongoing recovery are:: home with [facility]          Expected Discharge Plan and Services                                   HH Arranged: PT [place] Agency: [place] Date [facility] Agency Contacted: 03/31/22 Time HH Agency Contacted: 34 Representative spoke with at [place]: Tommi Rumps  Prior Living Arrangements/Services   Lives with:: Spouse Patient language and need for interpreter reviewed:: Yes Do you feel safe going back to the place where you live?: Yes      Need for Family Participation in Patient Care: Yes (Comment) Care giver support system in place?: Yes (comment)   Criminal Activity/Legal Involvement Pertinent to Current Situation/Hospitalization: No - Comment as needed  Activities of Daily Living Home Assistive Devices/Equipment: Cane (specify quad or straight), Eyeglasses, Walker (specify type) (single cane, rollator) ADL Screening (condition at time of admission) Patient's cognitive ability adequate to safely complete daily activities?: Yes Is the patient deaf or have difficulty hearing?: Yes Does the patient have difficulty seeing, even when wearing glasses/contacts?: No Does the patient have difficulty concentrating, remembering, or making decisions?: No Patient able to express need for assistance with ADLs?: Yes Does the patient have difficulty dressing or bathing?:  No Independently performs ADLs?: Yes (appropriate for developmental age) Does the patient have difficulty walking or climbing stairs?: Yes Weakness of Legs: Both Weakness of Arms/Hands: None  Permission Sought/Granted Permission sought to share information with : Family Supports    Share Information with NAME: spouse, Mrs. Schrager           Emotional Assessment         Alcohol / Substance Use: Not Applicable Psych Involvement: No (comment)  Admission diagnosis:  Sore throat [J02.9] Thrombocytopenia ([place]) [D69.6] Failure to thrive (child) [R62.51] Generalized weakness [R53.1] COVID-19 virus infection [U07.1] COVID-19 [U07.1] Patient Active Problem List   Diagnosis Date Noted   Failure to thrive (child) 03/30/2022   Generalized weakness 03/30/2022   COVID-19 03/29/2022   Failure to thrive in adult 03/29/2022   Thoracic aortic aneurysm without rupture ([place]) 02/11/2022   AAA (abdominal aortic aneurysm) ([place]) 11/14/2021   Cerebrovascular small vessel disease 09/18/2021   Gait instability 09/18/2021   Asymptomatic carotid artery stenosis without infarction, left 04/15/2021   Carotid artery stenosis 04/09/2021   Chronic pain syndrome 03/19/2021   Hard of hearing 03/19/2021   Hypertensive retinopathy of both eyes 11/15/2019   Pseudophakia of both eyes 11/15/2019   Spondylosis without myelopathy or radiculopathy, lumbar region 10/24/2018   Rheumatoid arthritis involving multiple sites with positive rheumatoid factor ([place]) 10/24/2018   Chronic myofascial pain 10/24/2018   DDD (degenerative disc disease), cervical 10/24/2018   Cervical spondylosis without myelopathy 10/24/2018   Thrombocytopenia (  [place]) 06/02/2017   Vitamin D deficiency 02/17/2017   Combined forms of age-related cataract of right eye 08/28/2016   Gastroesophageal reflux disease without esophagitis 12/18/2015   Chronic bilateral low back pain without sciatica 12/18/2015   Benign prostatic hyperplasia  12/18/2015   Pure hypercholesterolemia 10/17/2015   S/P coronary artery stent placement 12/21/2014   Coronary artery disease involving native coronary artery of native heart without angina pectoris 12/21/2014   Essential (primary) hypertension 10/11/2013   Nerve root pain 08/18/2013   PCP:  Baruch Gouty, FNP Pharmacy:   [id number] - [place], [place] [address] Phone: [phone] Fax: [phone]  [place] [place] [address] Phone: [phone] Fax: [phone]     Social Determinants of Health (SDOH) Social History: SDOH Screenings   Food Insecurity: No Food Insecurity (03/29/2022)  Housing: Low Risk  (03/29/2022)  Transportation Needs: No Transportation Needs (03/29/2022)  Utilities: Not At Risk (03/29/2022)  Alcohol Screen: Low Risk  (04/05/2021)  Depression (PHQ2-9): Low Risk  (03/05/2022)  Financial Resource Strain: Medium Risk (04/05/2021)  Physical Activity: Insufficiently Active (04/05/2021)  Social Connections: Moderately Isolated (04/05/2021)  Stress: No Stress Concern Present (04/05/2021)  Tobacco Use: Medium Risk (03/30/2022)   SDOH Interventions:     Readmission Risk Interventions     No data to display

## 2022-03-31 NOTE — Plan of Care (Signed)
  Problem: Acute Rehab PT Goals(only PT should resolve) Goal: Pt Will Go Supine/Side To Sit Outcome: Progressing Flowsheets (Taken 03/31/2022 1057) Pt will go Supine/Side to Sit:  Independently  with modified independence Goal: Patient Will Transfer Sit To/From Stand Outcome: Progressing Flowsheets (Taken 03/31/2022 1057) Patient will transfer sit to/from stand:  with modified independence  with supervision Goal: Pt Will Transfer Bed To Chair/Chair To Bed Outcome: Progressing Flowsheets (Taken 03/31/2022 1057) Pt will Transfer Bed to Chair/Chair to Bed:  with modified independence  with supervision Goal: Pt Will Ambulate Outcome: Progressing Flowsheets (Taken 03/31/2022 1057) Pt will Ambulate:  50 feet  with modified independence  with supervision  with rolling walker   10:58 AM, 03/31/22 Lonell Grandchild, MPT Physical Therapist with [facility] 336 [phone] office [phone] mobile phone

## 2022-03-31 NOTE — Evaluation (Signed)
Physical Therapy Evaluation Patient Details Name: Austin Cox MRN: [id number] DOB: Mar 10, 1946 Today's Date: 03/31/2022  History of Present Illness  Austin Cox is a 77 year old male with a history of ascending and descending thoracic aortic aneurysm, rheumatoid arthritis on methotrexate and Enbrel, nonobstructive coronary artery disease, carotid stenosis status post CEA, hypertension, hyperlipidemia, and TAVR cytopenia presenting with 2 to 3-day history of worsening generalized weakness, decreased oral intake, and diarrhea.  The patient states that he began having coughing and chest congestion, myalgias, arthralgias and sore throat about 1 week prior to this admission.  He has been having fevers and chills up to 102 F at home.  He has also been complaining of loose stools, about 4 to 5/day without any hematochezia or melena.  He went to see his PCP on 03/27/2022.  The patient was diagnosed with COVID-19 and started on molnupiravir.  Unfortunately, the patient continued to have increasing generalized weakness her point where he was needing assistance to get out of bed.  He has had decreased oral intake.  As result he presented for further evaluation and treatment.  He states that he is coughing so much that he has began developing some hemoptysis in the past 24 to 48 hours.  He denies any emesis, hematemesis, headache, neck pain, chest pain.  He has some dyspnea on exertion.  He denies any dysuria or hematuria.  In the ED, the patient had a low-grade temperature of 99.7 F.  He was tachycardic 100-110, hemodynamically stable with oxygen saturation 95% on room air.  WBC 4.5, hemoglobin 13.2, platelets 18,000.  Sodium 132, potassium 3.6, bicarbonate 23, serum creatinine 0.97.  AST 33, ALT 23, alk phos 53, total bilirubin 1.3.  UA negative for pyuria.  Chest x-ray was negative for infiltrates or edema.   Clinical Impression  Patient demonstrates good return for sitting up at bedside, labored movement  during transfers and able to safely walk in room using RW without loss of balance, limited mostly due to fatigue.  Patient tolerated sitting up in chair after therapy - nursing staff notified.  Patient will benefit from continued skilled physical therapy in hospital and recommended venue below to increase strength, balance, endurance for safe ADLs and gait.         Recommendations for follow up therapy are one component of a multi-disciplinary discharge planning process, led by the attending physician.  Recommendations may be updated based on patient status, additional functional criteria and insurance authorization.  Follow Up Recommendations Home health PT      Assistance Recommended at Discharge Set up Supervision/Assistance  Patient can return home with the following  A little help with walking and/or transfers;A little help with bathing/dressing/bathroom;Help with stairs or ramp for entrance;Assistance with cooking/housework    Equipment Recommendations None recommended by PT  Recommendations for Other Services       Functional Status Assessment Patient has had a recent decline in their functional status and demonstrates the ability to make significant improvements in function in a reasonable and predictable amount of time.     Precautions / Restrictions Precautions Precautions: Fall Restrictions Weight Bearing Restrictions: No      Mobility  Bed Mobility Overal bed mobility: Modified Independent                  Transfers Overall transfer level: Needs assistance Equipment used: Rolling walker (2 wheels) Transfers: Sit to/from Stand, Bed to chair/wheelchair/BSC Sit to Stand: Supervision   Step pivot transfers: Supervision, Min guard  General transfer comment: slightly labored movement    Ambulation/Gait Ambulation/Gait assistance: Supervision, Min guard Gait Distance (Feet): 40 Feet Assistive device: Rolling walker (2 wheels) Gait  Pattern/deviations: Decreased step length - right, Decreased step length - left, Decreased stride length, Trunk flexed Gait velocity: decreased     General Gait Details: slow slightly labored cadence without loss of balance, limited mostly due to fatigue  Stairs            Wheelchair Mobility    Modified Rankin (Stroke Patients Only)       Balance Overall balance assessment: Needs assistance Sitting-balance support: Feet supported, No upper extremity supported Sitting balance-Leahy Scale: Good Sitting balance - Comments: seated at EOB   Standing balance support: During functional activity, Bilateral upper extremity supported Standing balance-Leahy Scale: Fair Standing balance comment: fair/good using RW                             Pertinent Vitals/Pain Pain Assessment Pain Assessment: No/denies pain    Home Living Family/patient expects to be discharged to:: Private residence Living Arrangements: Spouse/significant other Available Help at Discharge: Family;Available 24 hours/day Type of Home: House         Home Layout: One level Home Equipment: Conservation officer, nature (2 wheels);Cane - single point;Grab bars - tub/shower;BSC/3in1      Prior Function Prior Level of Function : Needs assist       Physical Assist : Mobility (physical);ADLs (physical) Mobility (physical): Bed mobility;Transfers;Gait;Stairs   Mobility Comments: household ambulator leaning on walls and furniture for support, uses RW for longer distances ADLs Comments: assisted by family     Hand Dominance        Extremity/Trunk Assessment   Upper Extremity Assessment Upper Extremity Assessment: Overall WFL for tasks assessed    Lower Extremity Assessment Lower Extremity Assessment: Generalized weakness    Cervical / Trunk Assessment Cervical / Trunk Assessment: Normal  Communication   Communication: No difficulties  Cognition Arousal/Alertness: Awake/alert Behavior During  Therapy: WFL for tasks assessed/performed Overall Cognitive Status: Within Functional Limits for tasks assessed                                          General Comments      Exercises     Assessment/Plan    PT Assessment Patient needs continued PT services  PT Problem List Decreased strength;Decreased activity tolerance;Decreased balance;Decreased mobility       PT Treatment Interventions DME instruction;Gait training;Stair training;Functional mobility training;Therapeutic activities;Patient/family education;Therapeutic exercise;Balance training    PT Goals (Current goals can be found in the Care Plan section)  Acute Rehab PT Goals Patient Stated Goal: return home with family to assist PT Goal Formulation: With patient Time For Goal Achievement: 04/04/22 Potential to Achieve Goals: Good    Frequency Min 3X/week     Co-evaluation               AM-PAC PT "6 Clicks" Mobility  Outcome Measure Help needed turning from your back to your side while in a flat bed without using bedrails?: None Help needed moving from lying on your back to sitting on the side of a flat bed without using bedrails?: None Help needed moving to and from a bed to a chair (including a wheelchair)?: A Little Help needed standing up from a chair using your arms (e.g., wheelchair  or bedside chair)?: A Little Help needed to walk in hospital room?: A Little Help needed climbing 3-5 steps with a railing? : A Lot 6 Click Score: 19    End of Session   Activity Tolerance: Patient tolerated treatment well;Patient limited by fatigue Patient left: in chair;with call bell/phone within reach Nurse Communication: Mobility status PT Visit Diagnosis: Unsteadiness on feet (R26.81);Other abnormalities of gait and mobility (R26.89);Muscle weakness (generalized) (M62.81)    Time: 3267-1245 PT Time Calculation (min) (ACUTE ONLY): 22 min   Charges:   PT Evaluation $PT Eval Moderate  Complexity: 1 Mod PT Treatments $Therapeutic Activity: 8-22 mins        10:56 AM, 03/31/22 Lonell Grandchild, MPT Physical Therapist with [facility] 336 [phone] office [phone] mobile phone

## 2022-04-01 ENCOUNTER — Telehealth: Payer: Self-pay | Admitting: *Deleted

## 2022-04-01 ENCOUNTER — Encounter: Payer: Self-pay | Admitting: *Deleted

## 2022-04-01 LAB — CULTURE, BLOOD (ROUTINE X 2): Special Requests: ADEQUATE

## 2022-04-01 NOTE — Patient Outreach (Signed)
  Care Coordination TOC Note Transition Care Management Follow-up Telephone Call Date of discharge and from where: [facility] 03/31/22 How have you been since you were released from the hospital? Per wife, "He's about like he was. The covid isn't bothering him as bad but he ran a temperature of 101, during the night. I gave him a tylenol and he was ok and got up and ate breakfast. He's resting right now." Any questions or concerns? No  Items Reviewed: Did the pt receive and understand the discharge instructions provided? Yes  Medications obtained and verified? Yes . Hold Metoprolol per discharge instructions. Patient has been compliant. Encouraged to check and record blood pressure and pulse a couple of times a day until his hospital f/u appt and to take that log with them for review Other? Yes . Recommended mucinex (plain) and to increase fluids to help thin secretions. Wife is also sick with Covid, but recovering. Will need to repeat BMP & CBC at office visit. Assisted with resetting my chart password. Sent wife text to setup her My Chart Account.  Any new allergies since your discharge? No  Dietary orders reviewed? Yes Do you have support at home? Yes   Home Care and Equipment/Supplies: Were home health services ordered? yes If so, what is the name of the agency? [facility]  Has the agency set up a time to come to the patient's home? No. Provided telephone number, [phone]. Wife will reach out to schedule.  Were any new equipment or medical supplies ordered?  No What is the name of the medical supply agency?  Were you able to get the supplies/equipment? not applicable Do you have any questions related to the use of the equipment or supplies? No  Functional Questionnaire: (I = Independent and D = Dependent) ADLs: I  Bathing/Dressing- I  Meal Prep- I  Eating- I  Maintaining continence- I  Transferring/Ambulation- I  Managing Meds- I  Follow up appointments reviewed:  PCP  Hospital f/u appt confirmed? No . Requested care guide schedule appt. It has now been scheduled. [place] DOD 04/09/22 at 11:05 [facility] f/u appt confirmed?  Not indicated   Are transportation arrangements needed? No  If their condition worsens, is the pt aware to call PCP or go to the Emergency Dept.? Yes Was the patient provided with contact information for the PCP's office or ED? Yes Was to pt encouraged to call back with questions or concerns? Yes  SDOH assessments and interventions completed:   Yes SDOH Interventions Today    Flowsheet Row Most Recent Value  SDOH Interventions   Housing Interventions Intervention Not Indicated  Transportation Interventions Intervention Not Indicated       Care Coordination Interventions:  PCP follow up appointment requested Other interventions outlined above    Encounter Outcome:  Pt. Visit Completed    Chong Sicilian, BSN, RN-BC RN Care Coordinator [place] Direct Dial: [phone] Main #: [phone]

## 2022-04-01 NOTE — Progress Notes (Signed)
  Care Coordination  Note  04/01/2022 Name: Austin Cox MRN: [id number] DOB: [birth year]  Austin Cox is a 77 y.o. year old primary care patient of Rakes, Connye Burkitt, FNP. I reached out to Rosanne Sack by phone today to assist with scheduling a follow up appointment. Austin Cox verbally consented to my assistance.       Follow up plan: Hospital Follow Up appointment scheduled with Thayer Ohm, Connye Burkitt, FNP) on (04/09/22) at (11:05 AM).  [place]  Direct Dial: [phone]

## 2022-04-03 LAB — CULTURE, BLOOD (ROUTINE X 2)
Culture: NO GROWTH
Special Requests: ADEQUATE

## 2022-04-04 DIAGNOSIS — I2510 Atherosclerotic heart disease of native coronary artery without angina pectoris: Secondary | ICD-10-CM | POA: Diagnosis not present

## 2022-04-04 DIAGNOSIS — U071 COVID-19: Secondary | ICD-10-CM | POA: Diagnosis not present

## 2022-04-04 DIAGNOSIS — M069 Rheumatoid arthritis, unspecified: Secondary | ICD-10-CM | POA: Diagnosis not present

## 2022-04-04 DIAGNOSIS — I712 Thoracic aortic aneurysm, without rupture, unspecified: Secondary | ICD-10-CM | POA: Diagnosis not present

## 2022-04-04 DIAGNOSIS — I10 Essential (primary) hypertension: Secondary | ICD-10-CM | POA: Diagnosis not present

## 2022-04-07 ENCOUNTER — Ambulatory Visit: Payer: Medicare Other | Admitting: Audiologist

## 2022-04-08 ENCOUNTER — Ambulatory Visit (INDEPENDENT_AMBULATORY_CARE_PROVIDER_SITE_OTHER): Payer: Medicare Other

## 2022-04-08 VITALS — Ht 73.0 in | Wt 196.0 lb

## 2022-04-08 DIAGNOSIS — Z0000 Encounter for general adult medical examination without abnormal findings: Secondary | ICD-10-CM | POA: Diagnosis not present

## 2022-04-08 DIAGNOSIS — I10 Essential (primary) hypertension: Secondary | ICD-10-CM | POA: Diagnosis not present

## 2022-04-08 DIAGNOSIS — M069 Rheumatoid arthritis, unspecified: Secondary | ICD-10-CM | POA: Diagnosis not present

## 2022-04-08 DIAGNOSIS — I2510 Atherosclerotic heart disease of native coronary artery without angina pectoris: Secondary | ICD-10-CM | POA: Diagnosis not present

## 2022-04-08 DIAGNOSIS — I712 Thoracic aortic aneurysm, without rupture, unspecified: Secondary | ICD-10-CM | POA: Diagnosis not present

## 2022-04-08 DIAGNOSIS — U071 COVID-19: Secondary | ICD-10-CM | POA: Diagnosis not present

## 2022-04-08 NOTE — Patient Instructions (Signed)
Austin Cox , Thank you for taking time to come for your Medicare Wellness Visit. I appreciate your ongoing commitment to your health goals. Please review the following plan we discussed and let me know if I can assist you in the future.   These are the goals we discussed:  Goals      Prevent falls     Walk daily        This is a list of the screening recommended for you and due dates:  Health Maintenance  Topic Date Due   COVID-19 Vaccine (6 - 2023-24 season) 11/15/2021   Medicare Annual Wellness Visit  04/09/2023   DTaP/Tdap/Td vaccine (3 - Td or Tdap) 03/20/2031   Pneumonia Vaccine  Completed   Flu Shot  Completed   Hepatitis C Screening: USPSTF Recommendation to screen - Ages 18-79 yo.  Completed   Zoster (Shingles) Vaccine  Completed   HPV Vaccine  Aged Out    Advanced directives: Advance directive discussed with you today. I have provided a copy for you to complete at home and have notarized. Once this is complete please bring a copy in to our office so we can scan it into your chart.   Conditions/risks identified: Aim for 30 minutes of exercise or brisk walking, 6-8 glasses of water, and 5 servings of fruits and vegetables each day.   Next appointment: Follow up in one year for your annual wellness visit.   Preventive Care 79 Years and Older, Male  Preventive care refers to lifestyle choices and visits with your health care provider that can promote health and wellness. What does preventive care include? A yearly physical exam. This is also called an annual well check. Dental exams once or twice a year. Routine eye exams. Ask your health care provider how often you should have your eyes checked. Personal lifestyle choices, including: Daily care of your teeth and gums. Regular physical activity. Eating a healthy diet. Avoiding tobacco and drug use. Limiting alcohol use. Practicing safe sex. Taking low doses of aspirin every day. Taking vitamin and mineral  supplements as recommended by your health care provider. What happens during an annual well check? The services and screenings done by your health care provider during your annual well check will depend on your age, overall health, lifestyle risk factors, and family history of disease. Counseling  Your health care provider may ask you questions about your: Alcohol use. Tobacco use. Drug use. Emotional well-being. Home and relationship well-being. Sexual activity. Eating habits. History of falls. Memory and ability to understand (cognition). Work and work Statistician. Screening  You may have the following tests or measurements: Height, weight, and BMI. Blood pressure. Lipid and cholesterol levels. These may be checked every 5 years, or more frequently if you are over 46 years old. Skin check. Lung cancer screening. You may have this screening every year starting at age 79 if you have a 30-pack-year history of smoking and currently smoke or have quit within the past 15 years. Fecal occult blood test (FOBT) of the stool. You may have this test every year starting at age 10. Flexible sigmoidoscopy or colonoscopy. You may have a sigmoidoscopy every 5 years or a colonoscopy every 10 years starting at age 58. Prostate cancer screening. Recommendations will vary depending on your family history and other risks. Hepatitis C blood test. Hepatitis B blood test. Sexually transmitted disease (STD) testing. Diabetes screening. This is done by checking your blood sugar (glucose) after you have not eaten for a  while (fasting). You may have this done every 1-3 years. Abdominal aortic aneurysm (AAA) screening. You may need this if you are a current or former smoker. Osteoporosis. You may be screened starting at age 79 if you are at high risk. Talk with your health care provider about your test results, treatment options, and if necessary, the need for more tests. Vaccines  Your health care provider  may recommend certain vaccines, such as: Influenza vaccine. This is recommended every year. Tetanus, diphtheria, and acellular pertussis (Tdap, Td) vaccine. You may need a Td booster every 10 years. Zoster vaccine. You may need this after age 79. Pneumococcal 13-valent conjugate (PCV13) vaccine. One dose is recommended after age 79. Pneumococcal polysaccharide (PPSV23) vaccine. One dose is recommended after age 79. Talk to your health care provider about which screenings and vaccines you need and how often you need them. This information is not intended to replace advice given to you by your health care provider. Make sure you discuss any questions you have with your health care provider. Document Released: 03/30/2015 Document Revised: 11/21/2015 Document Reviewed: 01/02/2015 Elsevier Interactive Patient Education  2017 [place] Prevention in the Home Falls can cause injuries. They can happen to people of all ages. There are many things you can do to make your home safe and to help prevent falls. What can I do on the outside of my home? Regularly fix the edges of walkways and driveways and fix any cracks. Remove anything that might make you trip as you walk through a door, such as a raised step or threshold. Trim any bushes or trees on the path to your home. Use bright outdoor lighting. Clear any walking paths of anything that might make someone trip, such as rocks or tools. Regularly check to see if handrails are loose or broken. Make sure that both sides of any steps have handrails. Any raised decks and porches should have guardrails on the edges. Have any leaves, snow, or ice cleared regularly. Use sand or salt on walking paths during winter. Clean up any spills in your garage right away. This includes oil or grease spills. What can I do in the bathroom? Use night lights. Install grab bars by the toilet and in the tub and shower. Do not use towel bars as grab bars. Use  non-skid mats or decals in the tub or shower. If you need to sit down in the shower, use a plastic, non-slip stool. Keep the floor dry. Clean up any water that spills on the floor as soon as it happens. Remove soap buildup in the tub or shower regularly. Attach bath mats securely with double-sided non-slip rug tape. Do not have throw rugs and other things on the floor that can make you trip. What can I do in the bedroom? Use night lights. Make sure that you have a light by your bed that is easy to reach. Do not use any sheets or blankets that are too big for your bed. They should not hang down onto the floor. Have a firm chair that has side arms. You can use this for support while you get dressed. Do not have throw rugs and other things on the floor that can make you trip. What can I do in the kitchen? Clean up any spills right away. Avoid walking on wet floors. Keep items that you use a lot in easy-to-reach places. If you need to reach something above you, use a strong step stool that has a grab  bar. Keep electrical cords out of the way. Do not use floor polish or wax that makes floors slippery. If you must use wax, use non-skid floor wax. Do not have throw rugs and other things on the floor that can make you trip. What can I do with my stairs? Do not leave any items on the stairs. Make sure that there are handrails on both sides of the stairs and use them. Fix handrails that are broken or loose. Make sure that handrails are as long as the stairways. Check any carpeting to make sure that it is firmly attached to the stairs. Fix any carpet that is loose or worn. Avoid having throw rugs at the top or bottom of the stairs. If you do have throw rugs, attach them to the floor with carpet tape. Make sure that you have a light switch at the top of the stairs and the bottom of the stairs. If you do not have them, ask someone to add them for you. What else can I do to help prevent falls? Wear  shoes that: Do not have high heels. Have rubber bottoms. Are comfortable and fit you well. Are closed at the toe. Do not wear sandals. If you use a stepladder: Make sure that it is fully opened. Do not climb a closed stepladder. Make sure that both sides of the stepladder are locked into place. Ask someone to hold it for you, if possible. Clearly mark and make sure that you can see: Any grab bars or handrails. First and last steps. Where the edge of each step is. Use tools that help you move around (mobility aids) if they are needed. These include: Canes. Walkers. Scooters. Crutches. Turn on the lights when you go into a dark area. Replace any light bulbs as soon as they burn out. Set up your furniture so you have a clear path. Avoid moving your furniture around. If any of your floors are uneven, fix them. If there are any pets around you, be aware of where they are. Review your medicines with your doctor. Some medicines can make you feel dizzy. This can increase your chance of falling. Ask your doctor what other things that you can do to help prevent falls. This information is not intended to replace advice given to you by your health care provider. Make sure you discuss any questions you have with your health care provider. Document Released: 12/28/2008 Document Revised: 08/09/2015 Document Reviewed: 04/07/2014 Elsevier Interactive Patient Education  2017 [employer].

## 2022-04-08 NOTE — Progress Notes (Signed)
Subjective:   Austin Cox is a 77 y.o. male who presents for Medicare Annual/Subsequent preventive examination. I connected with  ALEXIZ SUSTAITA on 04/08/22 by a audio enabled telemedicine application and verified that I am speaking with the correct person using two identifiers.  Patient Location: Home  Provider Location: Home Office  I discussed the limitations of evaluation and management by telemedicine. The patient expressed understanding and agreed to proceed.  Review of Systems     Cardiac Risk Factors include: advanced age (>45men, >9 women);male gender     Objective:    Today's Vitals   04/08/22 1430  Weight: 196 lb (88.9 kg)  Height: 6\' 1"  (1.854 m)   Body mass index is 25.86 kg/m.     04/08/2022    2:34 PM 03/29/2022    8:15 AM 01/13/2022    9:33 AM 12/12/2021    8:23 AM 11/14/2021    8:53 AM 10/10/2021    8:27 AM 09/20/2021   10:20 AM  Advanced Directives  Does Patient Have a Medical Advance Directive? No No No No No No No  Would patient like information on creating a medical advance directive? No - Patient declined No - Patient declined No - Patient declined No - Patient declined No - Patient declined No - Patient declined No - Patient declined    Current Medications (verified) Outpatient Encounter Medications as of 04/08/2022  Medication Sig   acetaminophen (TYLENOL) 650 MG CR tablet Take 650 mg by mouth every 8 (eight) hours as needed for pain.   aspirin 81 MG EC tablet Take 81 mg by mouth daily.   baclofen (LIORESAL) 10 MG tablet Take 10 mg by mouth 2 (two) times daily.   Cholecalciferol 25 MCG (1000 UT) capsule Take 1 capsule (1,000 Units total) by mouth daily.   diclofenac Sodium (VOLTAREN) 1 % GEL Apply 1 application  topically 4 (four) times daily as needed (pain).   etanercept (ENBREL SURECLICK) 50 MG/ML injection Inject 50 mg into the skin every Friday.   folic acid (FOLVITE) 1 MG tablet Take 1 mg by mouth daily.   gabapentin (NEURONTIN) 300 MG  capsule Take 300 mg by mouth 2 (two) times daily.   methotrexate (RHEUMATREX) 2.5 MG tablet Take 4 mg by mouth every Tuesday.   nitroGLYCERIN (NITROSTAT) 0.3 MG SL tablet Place 0.3 mg under the tongue every 5 (five) minutes as needed.   rosuvastatin (CRESTOR) 40 MG tablet TAKE 1 TABLET BY MOUTH DAILY   Saw Palmetto 450-15 MG CAPS Take 2 tablets by mouth daily.   Tetrahydrozoline HCl (VISINE OP) Place 1 drop into both eyes daily as needed (dry eyes).   No facility-administered encounter medications on file as of 04/08/2022.    Allergies (verified) Patient has no known allergies.   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 No  pertinent family history. Social History   Socioeconomic History   Marital status: Married    Spouse name: Not on file   Number of children: 3   Years of education: Not on file   Highest education level: Not on file  Occupational History   Occupation: retired  Tobacco Use   Smoking status: Former    Packs/day: 0.20    Years: 10.00    Total pack years: 2.00    Types: Cigarettes    Quit date: 07/06/1974    Years since quitting: 47.7   Smokeless tobacco: Never  Vaping Use   Vaping Use: Never used  Substance and Sexual Activity   Alcohol use: Not Currently    Alcohol/week: 1.0 standard drink of alcohol    Types: 1 Cans of beer per week   Drug use: Never   Sexual activity: Not on file  Other Topics Concern   Not on  file  Social History Narrative   3 children - one passed away   Son in [place]   They raised 2 teenage grandchildren after their daughter and son in law both passed away    Social Determinants of Health   Financial Resource Strain: High Risk (04/08/2022)   Overall Financial Resource Strain (CARDIA)    Difficulty of Paying Living Expenses: Hard  Food Insecurity: No Food Insecurity (04/08/2022)   Hunger Vital Sign    Worried About Running Out of Food in the Last Year: Never true    Ran Out of Food in the Last Year: Never true  Transportation Needs: No Transportation Needs (04/08/2022)   PRAPARE - Administrator, Civil Service (Medical): No    Lack of Transportation (Non-Medical): No  Physical Activity: Inactive (04/08/2022)   Exercise Vital Sign    Days of Exercise per Week: 0 days    Minutes of Exercise per Session: 0 min  Stress: No Stress Concern Present (04/08/2022)   [employer] of Occupational Health - Occupational Stress Questionnaire    Feeling of Stress : Not at all  Social Connections: Moderately Isolated (04/08/2022)   Social Connection and Isolation Panel [NHANES]    Frequency of Communication with Friends and Family: More than three times a week    Frequency of Social Gatherings with Friends and Family: More than three times a week    Attends Religious Services: Never    Database administrator or Organizations: No    Attends Engineer, structural: Never    Marital Status: Married    Tobacco Counseling Counseling given: Not Answered   Clinical Intake:  Pre-visit preparation completed: Yes  Pain : No/denies pain     Nutritional Risks: None Diabetes: No  How often do you need to have someone help you when you read instructions, pamphlets, or other written materials from your doctor or pharmacy?: 1 - Never  Diabetic?no   Interpreter Needed?: No  Information entered by :: Renie Ora, LPN   Activities of Daily Living     04/08/2022    2:34 PM 03/29/2022   11:57 PM  In your present state of health, do you have any difficulty performing the following activities:  Hearing? 1   Vision? 0   Difficulty concentrating or making decisions? 0   Walking or climbing stairs? 1   Dressing or bathing? 1   Doing errands, shopping? 1 1  Preparing Food and eating ? Y   Using the Toilet? N   In the past six months, have you accidently  leaked urine? N   Do you have problems with loss of bowel control? N   Managing your Medications? Y   Managing your Finances? Y   Housekeeping or managing your Housekeeping? Y     Patient Care Team: Sonny Masters, FNP as PCP - General (Family Medicine) Doreatha Massed, MD as Medical Oncologist (Hematology)  Indicate any recent Medical Services you may have received from other than [facility] providers in the past year (date may be approximate).     Assessment:   This is a routine wellness examination for Pleas.  Hearing/Vision screen Vision Screening - Comments:: Wears rx glasses - up to date with routine eye exams with  Dr.Lee  Dietary issues and exercise activities discussed: Current Exercise Habits: The patient does not participate in regular exercise at present   Goals Addressed             This Visit's Progress    Prevent falls   On track    Walk daily       Depression Screen    04/08/2022    2:33 PM 03/05/2022    2:47 PM 09/18/2021    8:41 AM 05/24/2021   12:04 PM 04/05/2021    3:49 PM 03/19/2021   11:28 AM  PHQ 2/9 Scores  PHQ - 2 Score 0 0 0 0 0 0  PHQ- 9 Score   0 6 0 0    Fall Risk    04/08/2022    2:31 PM 03/05/2022    2:47 PM 09/18/2021    8:42 AM 05/24/2021   12:04 PM 04/05/2021    4:11 PM  Fall Risk   Falls in the past year? 0 0 0 0 0  Number falls in past yr: 0    0  Injury with Fall? 0    0  Risk for fall due to : No Fall Risks    Impaired balance/gait;Orthopedic patient  Follow up Falls prevention discussed   Falls evaluation completed Falls  prevention discussed    FALL RISK PREVENTION PERTAINING TO THE HOME:  Any stairs in or around the home? No  If so, are there any without handrails? No  Home free of loose throw rugs in walkways, pet beds, electrical cords, etc? Yes  Adequate lighting in your home to reduce risk of falls? Yes   ASSISTIVE DEVICES UTILIZED TO PREVENT FALLS:  Life alert? No  Use of a cane, walker or w/c? Yes  Grab bars in the bathroom? Yes  Shower chair or bench in shower? Yes  Elevated toilet seat or a handicapped toilet? Yes          04/08/2022    2:34 PM 04/05/2021    3:51 PM  6CIT Screen  What Year? 0 points 0 points  What month? 0 points 0 points  What time? 0 points 0 points  Count back from 20 0 points 0 points  Months in reverse 0 points 0 points  Repeat phrase 0 points 2 points  Total Score 0 points 2 points    Immunizations Immunization History  Administered Date(s) Administered   Fluad Quad(high Dose 65+) 11/28/2016, 12/26/2020, 12/25/2021   Influenza, High Dose Seasonal PF 11/22/2013, 12/18/2017   Influenza,inj,quad, With Preservative 12/20/2018   Influenza-Unspecified 11/22/2013   PFIZER(Purple Top)SARS-COV-2 Vaccination 05/13/2019, 06/08/2019, 01/04/2020, 07/13/2020   PNEUMOCOCCAL CONJUGATE-20 03/31/2022   Pfizer Covid-19 Vaccine Bivalent Booster 58yrs & up 12/26/2020   Pneumococcal Conjugate-13 07/19/2020   Td 03/19/2021   Tdap 09/16/2010   Zoster Recombinat (  Shingrix) 03/19/2021, 05/20/2021    TDAP status: Up to date  Flu Vaccine status: Up to date  Pneumococcal vaccine status: Up to date  Covid-19 vaccine status: Completed vaccines  Qualifies for Shingles Vaccine? Yes   Zostavax completed Yes   Shingrix Completed?: Yes  Screening Tests Health Maintenance  Topic Date Due   COVID-19 Vaccine (6 - 2023-24 season) 11/15/2021   Medicare Annual Wellness (AWV)  04/09/2023   DTaP/Tdap/Td (3 - Td or Tdap) 03/20/2031   Pneumonia Vaccine 23+ Years old  Completed    INFLUENZA VACCINE  Completed   Hepatitis C Screening  Completed   Zoster Vaccines- Shingrix  Completed   HPV VACCINES  Aged Out    Health Maintenance  Health Maintenance Due  Topic Date Due   COVID-19 Vaccine (6 - 2023-24 season) 11/15/2021    Colorectal cancer screening: No longer required.   Lung Cancer Screening: (Low Dose CT Chest recommended if Age 33-80 years, 30 pack-year currently smoking OR have quit w/in 15years.) does not qualify.   Lung Cancer Screening Referral: n/a  Additional Screening:  Hepatitis C Screening: does not qualify;   Vision Screening: Recommended annual ophthalmology exams for early detection of glaucoma and other disorders of the eye. Is the patient up to date with their annual eye exam?  Yes  Who is the provider or what is the name of the office in which the patient attends annual eye exams? Dr.Lee  If pt is not established with a provider, would they like to be referred to a provider to establish care? No .   Dental Screening: Recommended annual dental exams for proper oral hygiene  Community Resource Referral / Chronic Care Management: CRR required this visit?  No   CCM required this visit?  No      Plan:     I have personally reviewed and noted the following in the patient's chart:   Medical and social history Use of alcohol, tobacco or illicit drugs  Current medications and supplements including opioid prescriptions. Patient is not currently taking opioid prescriptions. Functional ability and status Nutritional status Physical activity Advanced directives List of other physicians Hospitalizations, surgeries, and ER visits in previous 12 months Vitals Screenings to include cognitive, depression, and falls Referrals and appointments  In addition, I have reviewed and discussed with patient certain preventive protocols, quality metrics, and best practice recommendations. A written personalized care plan for preventive services as  well as general preventive health recommendations were provided to patient.     Daphane Shepherd, LPN   [id number]/[id number]   Nurse Notes: none

## 2022-04-09 ENCOUNTER — Ambulatory Visit (INDEPENDENT_AMBULATORY_CARE_PROVIDER_SITE_OTHER): Payer: Medicare Other | Admitting: Family Medicine

## 2022-04-09 ENCOUNTER — Encounter: Payer: Self-pay | Admitting: Family Medicine

## 2022-04-09 VITALS — BP 125/77 | HR 90 | Temp 98.0°F | Ht 73.0 in | Wt 201.6 lb

## 2022-04-09 DIAGNOSIS — I2510 Atherosclerotic heart disease of native coronary artery without angina pectoris: Secondary | ICD-10-CM | POA: Diagnosis not present

## 2022-04-09 DIAGNOSIS — D696 Thrombocytopenia, unspecified: Secondary | ICD-10-CM | POA: Diagnosis not present

## 2022-04-09 DIAGNOSIS — U071 COVID-19: Secondary | ICD-10-CM | POA: Diagnosis not present

## 2022-04-09 DIAGNOSIS — E871 Hypo-osmolality and hyponatremia: Secondary | ICD-10-CM | POA: Diagnosis not present

## 2022-04-09 DIAGNOSIS — E876 Hypokalemia: Secondary | ICD-10-CM

## 2022-04-09 DIAGNOSIS — Z09 Encounter for follow-up examination after completed treatment for conditions other than malignant neoplasm: Secondary | ICD-10-CM | POA: Diagnosis not present

## 2022-04-09 DIAGNOSIS — I10 Essential (primary) hypertension: Secondary | ICD-10-CM

## 2022-04-09 MED ORDER — METOPROLOL SUCCINATE ER 25 MG PO TB24: 25.0000 mg | ORAL_TABLET | Freq: Every day | ORAL | 2 refills | Status: DC

## 2022-04-09 NOTE — Progress Notes (Signed)
Subjective:  Patient ID: Austin Cox, male    DOB: 08/15/1945, 77 y.o.   MRN: [id number]  Patient Care Team: Sonny Mastersakes, Deette Revak M, FNP as PCP - General (Family Medicine) Doreatha MassedKatragadda, Sreedhar, MD as Medical Oncologist (Hematology)   Chief Complaint:  Hospitalization Follow-up (AP 1/13-1/15 - covid )   HPI: Austin Cox is a 77 y.o. male presenting on 04/09/2022 for Hospitalization Follow-up (AP 1/13-1/15 - covid )   Pt presents today for hospital discharge follow up. He was seen in office and started on antiviral therapy for COVID-19 illness. Symptoms progressed and pt [facility] on 03/29/2022 and was admitted due to COVID, hypokalemia, hyponatremia, thrombocytopenia, FTT, and hemoptysis. He was discharged home on 03/31/2022. He states he feels weak but is improving. Eating and drinking normally. States voiding normally.    Relevant past medical, surgical, family, and social history reviewed and updated as indicated.  Allergies and medications reviewed and updated. Data reviewed: Chart in Epic.   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    Past Surgical History:  Procedure Laterality Date   CATARACT EXTRACTION     ENDARTERECTOMY Left 04/15/2021   Procedure: LEFT CAROTID ENDARTERECTOMY WITH;  Surgeon: Cephus Shellinglark, Christopher J, MD;  [facility];  Service: Vascular;  Laterality: Left;   EYE SURGERY     detached retina right eye   FRACTURE SURGERY     right leg   Stent in heart      Social History   Socioeconomic History   Marital status: Married    Spouse name: Not on file   Number of children: 3   Years of education: Not on file   Highest education level: Not on file  Occupational History   Occupation: retired  Tobacco Use   Smoking status: Former    Packs/day: 0.20    Years: 10.00     Total pack years: 2.00    Types: Cigarettes    Quit date: 07/06/1974    Years since quitting: 47.7   Smokeless tobacco: Never  Vaping Use   Vaping Use: Never used  Substance and Sexual Activity   Alcohol use: Not Currently    Alcohol/week: 1.0 standard drink of alcohol    Types: 1 Cans of beer per week   Drug use: Never   Sexual activity: Not on file  Other Topics Concern   Not on file  Social History Narrative   3 children - one passed away   Son in [place]   They raised 2 teenage grandchildren after their daughter and son in law both passed away    Social Determinants of Health   Financial Resource Strain: High Risk (04/08/2022)   Overall Financial Resource Strain (CARDIA)    Difficulty of Paying Living Expenses: Hard  Food Insecurity: No Food Insecurity (04/08/2022)   Hunger Vital Sign    Worried About Running Out of Food in the Last Year: Never true    Ran Out of Food in the Last Year: Never true  Transportation Needs: No Transportation Needs (04/08/2022)   PRAPARE - Administrator, Civil ServiceTransportation    Lack of Transportation (Medical): No    Lack of Transportation (Non-Medical): No  Physical Activity: Inactive (04/08/2022)   Exercise Vital Sign    Days of  Exercise per Week: 0 days    Minutes of Exercise per Session: 0 min  Stress: No Stress Concern Present (04/08/2022)   [place]    Feeling of Stress : Not at all  Social Connections: Moderately Isolated (04/08/2022)   Social Connection and Isolation Panel [NHANES]    Frequency of Communication with Friends and Family: More than three times a week    Frequency of Social Gatherings with Friends and Family: More than three times a week    Attends Religious Services: Never    Marine scientist or Organizations: No    Attends Archivist Meetings: Never    Marital Status: Married  Human resources officer Violence: Not At Risk (04/08/2022)   Humiliation, Afraid, Rape,  and Kick questionnaire    Fear of Current or Ex-Partner: No    Emotionally Abused: No    Physically Abused: No    Sexually Abused: No    Outpatient Encounter Medications as of 04/09/2022  Medication Sig   acetaminophen (TYLENOL) 650 MG CR tablet Take 650 mg by mouth every 8 (eight) hours as needed for pain.   aspirin 81 MG EC tablet Take 81 mg by mouth daily.   baclofen (LIORESAL) 10 MG tablet Take 10 mg by mouth 2 (two) times daily.   Cholecalciferol 25 MCG (1000 UT) capsule Take 1 capsule (1,000 Units total) by mouth daily.   diclofenac Sodium (VOLTAREN) 1 % GEL Apply 1 application  topically 4 (four) times daily as needed (pain).   etanercept (ENBREL SURECLICK) 50 MG/ML injection Inject 50 mg into the skin every Friday.   folic acid (FOLVITE) 1 MG tablet Take 1 mg by mouth daily.   gabapentin (NEURONTIN) 300 MG capsule Take 300 mg by mouth 2 (two) times daily.   methotrexate (RHEUMATREX) 2.5 MG tablet Take 4 mg by mouth every Tuesday.   nitroGLYCERIN (NITROSTAT) 0.3 MG SL tablet Place 0.3 mg under the tongue every 5 (five) minutes as needed.   rosuvastatin (CRESTOR) 40 MG tablet TAKE 1 TABLET BY MOUTH DAILY   Saw Palmetto 450-15 MG CAPS Take 2 tablets by mouth daily.   Tetrahydrozoline HCl (VISINE OP) Place 1 drop into both eyes daily as needed (dry eyes).   metoprolol succinate (TOPROL-XL) 25 MG 24 hr tablet Take 1 tablet (25 mg total) by mouth daily.   No facility-administered encounter medications on file as of 04/09/2022.    No Known Allergies  Review of Systems  Constitutional:  Positive for activity change, appetite change and fatigue. Negative for chills, diaphoresis, fever and unexpected weight change.  HENT:  Positive for hearing loss.   Respiratory:  Negative for cough and shortness of breath.   Cardiovascular:  Negative for chest pain, palpitations and leg swelling.  Gastrointestinal:  Negative for abdominal pain, diarrhea, nausea and vomiting.  Genitourinary:  Negative  for decreased urine volume and difficulty urinating.  Musculoskeletal:  Negative for arthralgias and myalgias.  Neurological:  Positive for weakness. Negative for dizziness, tremors, syncope, facial asymmetry, light-headedness, numbness and headaches.  Psychiatric/Behavioral:  Negative for confusion.   All other systems reviewed and are negative.       Objective:  BP 125/77   Pulse 90   Temp 98 F (36.7 C) (Temporal)   Ht 6\' 1"  (1.854 m)   Wt 201 lb 9.6 oz (91.4 kg)   SpO2 93%   BMI 26.60 kg/m    Wt Readings from Last 3 Encounters:  04/09/22 201  lb 9.6 oz (91.4 kg)  04/08/22 196 lb (88.9 kg)  03/29/22 196 lb 3.4 oz (89 kg)    Physical Exam Vitals and nursing note reviewed.  Constitutional:      General: He is not in acute distress.    Appearance: Normal appearance. He is well-developed and well-groomed. He is not ill-appearing, toxic-appearing or diaphoretic.  HENT:     Head: Normocephalic and atraumatic.     Jaw: There is normal jaw occlusion.     Right Ear: Hearing normal.     Left Ear: Hearing normal.     Nose: Nose normal.     Mouth/Throat:     Lips: Pink.     Mouth: Mucous membranes are moist.     Pharynx: Oropharynx is clear. Uvula midline.  Eyes:     General: Lids are normal.     Conjunctiva/sclera: Conjunctivae normal.     Comments: Right pupil 2 mm, left pupil 4 mm, normal for pt    Neck:     Thyroid: No thyroid mass, thyromegaly or thyroid tenderness.     Vascular: No carotid bruit or JVD.     Trachea: Trachea and phonation normal.  Cardiovascular:     Rate and Rhythm: Normal rate and regular rhythm.     Chest Wall: PMI is not displaced.     Pulses: Normal pulses.     Heart sounds: Normal heart sounds. No murmur heard.    No friction rub. No gallop.  Pulmonary:     Effort: Pulmonary effort is normal. No respiratory distress.     Breath sounds: Normal breath sounds. No wheezing.  Abdominal:     General: Bowel sounds are normal. There is no  distension or abdominal bruit.     Palpations: Abdomen is soft. There is no hepatomegaly or splenomegaly.     Tenderness: There is no abdominal tenderness. There is no right CVA tenderness or left CVA tenderness.     Hernia: No hernia is present.  Musculoskeletal:        General: Normal range of motion.     Cervical back: Normal range of motion and neck supple.     Right lower leg: No edema.     Left lower leg: No edema.  Lymphadenopathy:     Cervical: No cervical adenopathy.  Skin:    General: Skin is warm and dry.     Capillary Refill: Capillary refill takes less than 2 seconds.     Coloration: Skin is not cyanotic, jaundiced or pale.     Findings: No rash.  Neurological:     General: No focal deficit present.     Mental Status: He is alert and oriented to person, place, and time.     Sensory: Sensation is intact.     Motor: Motor function is intact.     Coordination: Coordination is intact.     Gait: Gait abnormal (slow, using walking stick).     Deep Tendon Reflexes: Reflexes are normal and symmetric.  Psychiatric:        Attention and Perception: Attention and perception normal.        Mood and Affect: Mood and affect normal.        Speech: Speech normal.        Behavior: Behavior normal. Behavior is cooperative.        Thought Content: Thought content normal.        Cognition and Memory: Cognition and memory normal.        Judgment:  Judgment normal.     Results for orders placed or performed during the hospital encounter of 03/29/22  Blood Culture (routine x 2)   Specimen: BLOOD LEFT FOREARM  Result Value Ref Range   Specimen Description      BLOOD LEFT FOREARM BOTTLES DRAWN AEROBIC AND ANAEROBIC Performed at [facility], [address], [place], [place] [address]    Special Requests      Blood Culture adequate volume Performed at [facility], [address], [place], [place] [address]    Culture  Setup Time      GRAM POSITIVE COCCI Gram Stain Report Called  to,Read Back By and Verified With: LENARD,T @ 1833 ON 03/30/22 BY JUW AEROBIC BOTTLE ONLY GS DONE @ [facility] Organism ID to follow CRITICAL RESULT CALLED TO, READ BACK BY AND VERIFIED WITH:  C/ A. Cathie Hoops, RN 03/30/22 2134 [address]. LAFRANCE    Culture (A)     STAPHYLOCOCCUS EPIDERMIDIS THE SIGNIFICANCE OF ISOLATING THIS ORGANISM FROM A SINGLE VENIPUNCTURE CANNOT BE PREDICTED WITHOUT FURTHER CLINICAL AND CULTURE CORRELATION. SUSCEPTIBILITIES AVAILABLE ONLY ON REQUEST. Performed at [facility], [address], [place], [place] [address]    Report Status 04/01/2022 FINAL   Blood Culture (routine x 2)   Specimen: BLOOD RIGHT FOREARM  Result Value Ref Range   Specimen Description      BLOOD RIGHT FOREARM BOTTLES DRAWN AEROBIC AND ANAEROBIC   Special Requests Blood Culture adequate volume    Culture      NO GROWTH 5 DAYS Performed at [facility], [address], [place], [place] [address]    Report Status 04/03/2022 FINAL   Urine Culture   Specimen: Urine, Catheterized  Result Value Ref Range   Specimen Description      URINE, CATHETERIZED Performed at [facility], [address], [place], [place] [address]    Special Requests      NONE Performed at [facility], [address] [place], [place] [address]    Culture      NO GROWTH Performed at [facility], [address], [place], [place] [address]    Report Status 03/30/2022 FINAL   Resp panel by RT-PCR (RSV, Flu A&B, Covid) Anterior Nasal Swab   Specimen: Anterior Nasal Swab  Result Value Ref Range   SARS Coronavirus 2 by RT PCR POSITIVE (A) NEGATIVE   Influenza A by PCR NEGATIVE NEGATIVE   Influenza B by PCR NEGATIVE NEGATIVE   Resp Syncytial Virus by PCR NEGATIVE NEGATIVE  Group A Strep by PCR   Specimen: Anterior Nasal Swab; Sterile Swab  Result Value Ref Range   Group A Strep by PCR NOT DETECTED NOT DETECTED  Blood Culture ID Panel (Reflexed)  Result Value Ref Range   Enterococcus faecalis NOT DETECTED NOT  DETECTED   Enterococcus Faecium NOT DETECTED NOT DETECTED   Listeria monocytogenes NOT DETECTED NOT DETECTED   Staphylococcus species DETECTED (A) NOT DETECTED   Staphylococcus aureus (BCID) NOT DETECTED NOT DETECTED   Staphylococcus epidermidis DETECTED (A) NOT DETECTED   Staphylococcus lugdunensis NOT DETECTED NOT DETECTED   Streptococcus species NOT DETECTED NOT DETECTED   Streptococcus agalactiae NOT DETECTED NOT DETECTED   Streptococcus pneumoniae NOT DETECTED NOT DETECTED   Streptococcus pyogenes NOT DETECTED NOT DETECTED   A.calcoaceticus-baumannii NOT DETECTED NOT DETECTED   Bacteroides fragilis NOT DETECTED NOT DETECTED   Enterobacterales NOT DETECTED NOT DETECTED   Enterobacter cloacae complex NOT DETECTED NOT DETECTED   Escherichia coli NOT DETECTED NOT DETECTED  Klebsiella aerogenes NOT DETECTED NOT DETECTED   Klebsiella oxytoca NOT DETECTED NOT DETECTED   Klebsiella pneumoniae NOT DETECTED NOT DETECTED   Proteus species NOT DETECTED NOT DETECTED   Salmonella species NOT DETECTED NOT DETECTED   Serratia marcescens NOT DETECTED NOT DETECTED   Haemophilus influenzae NOT DETECTED NOT DETECTED   Neisseria meningitidis NOT DETECTED NOT DETECTED   Pseudomonas aeruginosa NOT DETECTED NOT DETECTED   Stenotrophomonas maltophilia NOT DETECTED NOT DETECTED   Candida albicans NOT DETECTED NOT DETECTED   Candida auris NOT DETECTED NOT DETECTED   Candida glabrata NOT DETECTED NOT DETECTED   Candida krusei NOT DETECTED NOT DETECTED   Candida parapsilosis NOT DETECTED NOT DETECTED   Candida tropicalis NOT DETECTED NOT DETECTED   Cryptococcus neoformans/gattii NOT DETECTED NOT DETECTED   Methicillin resistance mecA/C NOT DETECTED NOT DETECTED  Lipase, blood  Result Value Ref Range   Lipase 44 11 - 51 U/L  Comprehensive metabolic panel  Result Value Ref Range   Sodium 132 (L) 135 - 145 mmol/L   Potassium 3.6 3.5 - 5.1 mmol/L   Chloride 101 98 - 111 mmol/L   CO2 23 22 - 32 mmol/L    Glucose, Bld 95 70 - 99 mg/dL   BUN 15 8 - 23 mg/dL   Creatinine, Ser 2.95 0.61 - 1.24 mg/dL   Calcium 8.4 (L) 8.9 - 10.3 mg/dL   Total Protein 7.1 6.5 - 8.1 g/dL   Albumin 3.8 3.5 - 5.0 g/dL   AST 33 15 - 41 U/L   ALT 23 0 - 44 U/L   Alkaline Phosphatase 53 38 - 126 U/L   Total Bilirubin 1.3 (H) 0.3 - 1.2 mg/dL   GFR, Estimated >62 >13 mL/min   Anion gap 8 5 - 15  CBC  Result Value Ref Range   WBC 4.5 4.0 - 10.5 K/uL   RBC 4.97 4.22 - 5.81 MIL/uL   Hemoglobin 13.2 13.0 - 17.0 g/dL   HCT 08.6 57.8 - 46.9 %   MCV 82.9 80.0 - 100.0 fL   MCH 26.6 26.0 - 34.0 pg   MCHC 32.0 30.0 - 36.0 g/dL   RDW 62.9 (H) 52.8 - 41.3 %   Platelets 18 (LL) 150 - 400 K/uL   nRBC 0.0 0.0 - 0.2 %  Urinalysis, Routine w reflex microscopic  Result Value Ref Range   Color, Urine YELLOW YELLOW   APPearance CLEAR CLEAR   Specific Gravity, Urine 1.017 1.005 - 1.030   pH 6.0 5.0 - 8.0   Glucose, UA NEGATIVE NEGATIVE mg/dL   Hgb urine dipstick MODERATE (A) NEGATIVE   Bilirubin Urine NEGATIVE NEGATIVE   Ketones, ur 20 (A) NEGATIVE mg/dL   Protein, ur >=244 (A) NEGATIVE mg/dL   Nitrite NEGATIVE NEGATIVE   Leukocytes,Ua NEGATIVE NEGATIVE   RBC / HPF 21-50 0 - 5 RBC/hpf   WBC, UA 0-5 0 - 5 WBC/hpf   Bacteria, UA NONE SEEN NONE SEEN   Squamous Epithelial / HPF 0-5 0 - 5 /HPF   Mucus PRESENT    Hyaline Casts, UA PRESENT   Lactic acid, plasma  Result Value Ref Range   Lactic Acid, Venous 1.2 0.5 - 1.9 mmol/L  Lactic acid, plasma  Result Value Ref Range   Lactic Acid, Venous 1.4 0.5 - 1.9 mmol/L  Protime-INR  Result Value Ref Range   Prothrombin Time 14.6 11.4 - 15.2 seconds   INR 1.1 0.8 - 1.2  Brain natriuretic peptide  Result Value Ref Range  B Natriuretic Peptide 74.0 0.0 - 100.0 pg/mL  Basic metabolic panel  Result Value Ref Range   Sodium 133 (L) 135 - 145 mmol/L   Potassium 3.6 3.5 - 5.1 mmol/L   Chloride 104 98 - 111 mmol/L   CO2 21 (L) 22 - 32 mmol/L   Glucose, Bld 87 70 - 99 mg/dL    BUN 16 8 - 23 mg/dL   Creatinine, Ser 0.80 0.61 - 1.24 mg/dL   Calcium 8.0 (L) 8.9 - 10.3 mg/dL   GFR, Estimated >60 >60 mL/min   Anion gap 8 5 - 15  CBC  Result Value Ref Range   WBC 4.3 4.0 - 10.5 K/uL   RBC 4.47 4.22 - 5.81 MIL/uL   Hemoglobin 11.6 (L) 13.0 - 17.0 g/dL   HCT 37.0 (L) 39.0 - 52.0 %   MCV 82.8 80.0 - 100.0 fL   MCH 26.0 26.0 - 34.0 pg   MCHC 31.4 30.0 - 36.0 g/dL   RDW 18.8 (H) 11.5 - 15.5 %   Platelets 30 (L) 150 - 400 K/uL   nRBC 0.0 0.0 - 0.2 %  C-reactive protein  Result Value Ref Range   CRP 8.7 (H) <1.0 mg/dL  C-reactive protein  Result Value Ref Range   CRP 8.7 (H) <1.0 mg/dL  Ferritin  Result Value Ref Range   Ferritin 129 24 - 336 ng/mL  Ferritin  Result Value Ref Range   Ferritin 131 24 - 336 ng/mL  Lactate dehydrogenase  Result Value Ref Range   LDH 138 98 - 192 U/L  D-dimer, quantitative  Result Value Ref Range   D-Dimer, Quant 1.75 (H) 0.00 - 0.50 ug/mL-FEU  D-dimer, quantitative  Result Value Ref Range   D-Dimer, Quant 1.70 (H) 0.00 - 0.50 ug/mL-FEU  Procalcitonin - Baseline  Result Value Ref Range   Procalcitonin 0.19 ng/mL  Vitamin B12  Result Value Ref Range   Vitamin B-12 534 180 - 914 pg/mL  Folate  Result Value Ref Range   Folate 20.1 >5.9 ng/mL  TSH  Result Value Ref Range   TSH 2.217 0.350 - 4.500 uIU/mL  T4, free  Result Value Ref Range   Free T4 1.17 (H) 0.61 - 1.12 ng/dL  CK  Result Value Ref Range   Total CK 82 49 - 397 U/L  C-reactive protein  Result Value Ref Range   CRP 10.2 (H) <1.0 mg/dL  Ferritin  Result Value Ref Range   Ferritin 136 24 - 336 ng/mL  D-dimer, quantitative  Result Value Ref Range   D-Dimer, Quant 2.09 (H) 0.00 - 0.50 ug/mL-FEU  Comprehensive metabolic panel  Result Value Ref Range   Sodium 131 (L) 135 - 145 mmol/L   Potassium 3.2 (L) 3.5 - 5.1 mmol/L   Chloride 103 98 - 111 mmol/L   CO2 19 (L) 22 - 32 mmol/L   Glucose, Bld 105 (H) 70 - 99 mg/dL   BUN 11 8 - 23 mg/dL    Creatinine, Ser 0.81 0.61 - 1.24 mg/dL   Calcium 7.9 (L) 8.9 - 10.3 mg/dL   Total Protein 5.6 (L) 6.5 - 8.1 g/dL   Albumin 2.9 (L) 3.5 - 5.0 g/dL   AST 27 15 - 41 U/L   ALT 19 0 - 44 U/L   Alkaline Phosphatase 34 (L) 38 - 126 U/L   Total Bilirubin 1.3 (H) 0.3 - 1.2 mg/dL   GFR, Estimated >60 >60 mL/min   Anion gap 9 5 - 15  CBC  Result Value Ref Range   WBC 5.3 4.0 - 10.5 K/uL   RBC 4.18 (L) 4.22 - 5.81 MIL/uL   Hemoglobin 11.0 (L) 13.0 - 17.0 g/dL   HCT 16.134.3 (L) 09.639.0 - 04.552.0 %   MCV 82.1 80.0 - 100.0 fL   MCH 26.3 26.0 - 34.0 pg   MCHC 32.1 30.0 - 36.0 g/dL   RDW 40.918.9 (H) 81.111.5 - 91.415.5 %   Platelets 32 (L) 150 - 400 K/uL   nRBC 0.0 0.0 - 0.2 %  Prepare platelet pheresis  Result Value Ref Range   Unit Number [id number]    Blood Component Type PLTP1 PSORALEN TREATED    Unit division 00    Status of Unit ISSUED,FINAL    Transfusion Status      OK TO TRANSFUSE Performed at [facility], [address] [place], [place] [address]   ABO/Rh  Result Value Ref Range   ABO/RH(D)      A POS Performed at [facility], [address] [place], [place] [address]   [facility]  Result Value Ref Range   ISSUE DATE / TIME [id number]    Blood Product Unit Number [id number]    PRODUCT CODE [id number]    Unit Type and Rh 6200    Blood Product Expiration Date [id number]        Pertinent labs & imaging results that were available during my care of the patient were reviewed by me and considered in my medical decision making.  Assessment & Plan:  Chanetta MarshallJimmy was seen today for hospitalization follow-up.  Diagnoses and all orders for this visit:  Hospital discharge follow-up COVID-19 Hyponatremia Hypokalemia Thrombocytopenia (HCC) Today's visit was for Transitional Care Management. The patient [facility] on 03/31/2022 with a primary diagnosis of COVID-19 viral infection.  Contact with the patient and/or caregiver, by a clinical staff member, was made  on 04/01/2022 and was documented as a telephone encounter within the EMR. Through chart review and discussion with the patient I have determined that management of their condition is of high complexity.   -     CMP14+EGFR -     CBC with Differential/Platelet  Essential (primary) hypertension Coronary artery disease involving native coronary artery of native heart without angina pectoris HR in the 90s today and BP normal. Will restart BB therapy. Pt aware if HR or BP are low, can hold BB therapy.  -     metoprolol succinate (TOPROL-XL) 25 MG 24 hr tablet; Take 1 tablet (25 mg total) by mouth daily.     Continue all other maintenance medications.  Follow up plan: Return if symptoms worsen or fail to improve.   Continue healthy lifestyle choices, including diet (rich in fruits, vegetables, and lean proteins, and low in salt and simple carbohydrates) and exercise (at least 30 minutes of moderate physical activity daily).  The above assessment and management plan was discussed with the patient. The patient verbalized understanding of and has agreed to the management plan. Patient is aware to call the clinic if they develop any new symptoms or if symptoms persist or worsen. Patient is aware when to return to the clinic for a follow-up visit. Patient educated on when it is appropriate to go to the emergency department.   Kari BaarsMichelle Ramyah Pankowski, FNP-C [facility] [phone]

## 2022-04-10 LAB — CBC WITH DIFFERENTIAL/PLATELET

## 2022-04-11 DIAGNOSIS — I10 Essential (primary) hypertension: Secondary | ICD-10-CM | POA: Diagnosis not present

## 2022-04-11 DIAGNOSIS — I712 Thoracic aortic aneurysm, without rupture, unspecified: Secondary | ICD-10-CM | POA: Diagnosis not present

## 2022-04-11 DIAGNOSIS — U071 COVID-19: Secondary | ICD-10-CM | POA: Diagnosis not present

## 2022-04-11 DIAGNOSIS — I2510 Atherosclerotic heart disease of native coronary artery without angina pectoris: Secondary | ICD-10-CM | POA: Diagnosis not present

## 2022-04-11 DIAGNOSIS — M069 Rheumatoid arthritis, unspecified: Secondary | ICD-10-CM | POA: Diagnosis not present

## 2022-04-14 DIAGNOSIS — I10 Essential (primary) hypertension: Secondary | ICD-10-CM | POA: Diagnosis not present

## 2022-04-14 DIAGNOSIS — I712 Thoracic aortic aneurysm, without rupture, unspecified: Secondary | ICD-10-CM | POA: Diagnosis not present

## 2022-04-14 DIAGNOSIS — U071 COVID-19: Secondary | ICD-10-CM | POA: Diagnosis not present

## 2022-04-14 DIAGNOSIS — I2510 Atherosclerotic heart disease of native coronary artery without angina pectoris: Secondary | ICD-10-CM | POA: Diagnosis not present

## 2022-04-14 DIAGNOSIS — M069 Rheumatoid arthritis, unspecified: Secondary | ICD-10-CM | POA: Diagnosis not present

## 2022-04-15 ENCOUNTER — Ambulatory Visit (INDEPENDENT_AMBULATORY_CARE_PROVIDER_SITE_OTHER): Payer: Medicare Other

## 2022-04-15 DIAGNOSIS — I10 Essential (primary) hypertension: Secondary | ICD-10-CM | POA: Diagnosis not present

## 2022-04-15 DIAGNOSIS — I700 Atherosclerosis of aorta: Secondary | ICD-10-CM

## 2022-04-15 DIAGNOSIS — M069 Rheumatoid arthritis, unspecified: Secondary | ICD-10-CM | POA: Diagnosis not present

## 2022-04-15 DIAGNOSIS — D696 Thrombocytopenia, unspecified: Secondary | ICD-10-CM | POA: Diagnosis not present

## 2022-04-15 DIAGNOSIS — I712 Thoracic aortic aneurysm, without rupture, unspecified: Secondary | ICD-10-CM | POA: Diagnosis not present

## 2022-04-15 DIAGNOSIS — E785 Hyperlipidemia, unspecified: Secondary | ICD-10-CM | POA: Diagnosis not present

## 2022-04-15 DIAGNOSIS — I2510 Atherosclerotic heart disease of native coronary artery without angina pectoris: Secondary | ICD-10-CM

## 2022-04-15 DIAGNOSIS — U071 COVID-19: Secondary | ICD-10-CM

## 2022-04-15 LAB — CBC WITH DIFFERENTIAL/PLATELET
Basophils Absolute: 0 10*3/uL (ref 0.0–0.2)
Basos: 0 %
EOS (ABSOLUTE): 0 10*3/uL (ref 0.0–0.4)
Eos: 0 %
Hematocrit: 36.5 % — ABNORMAL LOW (ref 37.5–51.0)
Hemoglobin: 12 g/dL — ABNORMAL LOW (ref 13.0–17.7)
Lymphocytes Absolute: 1.5 10*3/uL (ref 0.7–3.1)
Lymphs: 21 %
MCH: 26.3 pg — ABNORMAL LOW (ref 26.6–33.0)
MCHC: 32.9 g/dL (ref 31.5–35.7)
MCV: 80 fL (ref 79–97)
Monocytes Absolute: 2.1 10*3/uL — ABNORMAL HIGH (ref 0.1–0.9)
Monocytes: 29 %
Neutrophils Absolute: 3.6 10*3/uL (ref 1.4–7.0)
Neutrophils: 49 %
Platelets: 129 10*3/uL — ABNORMAL LOW (ref 150–450)
RBC: 4.56 x10E6/uL (ref 4.14–5.80)
RDW: 17.4 % — ABNORMAL HIGH (ref 11.6–15.4)
WBC: 7.3 10*3/uL (ref 3.4–10.8)

## 2022-04-15 LAB — CMP14+EGFR
ALT: 153 IU/L — ABNORMAL HIGH (ref 0–44)
AST: 115 IU/L — ABNORMAL HIGH (ref 0–40)
Albumin/Globulin Ratio: 1.3 (ref 1.2–2.2)
Albumin: 3.6 g/dL — ABNORMAL LOW (ref 3.8–4.8)
Alkaline Phosphatase: 77 IU/L (ref 44–121)
BUN/Creatinine Ratio: 10 (ref 10–24)
BUN: 9 mg/dL (ref 8–27)
Bilirubin Total: 0.9 mg/dL (ref 0.0–1.2)
CO2: 24 mmol/L (ref 20–29)
Calcium: 8.6 mg/dL (ref 8.6–10.2)
Chloride: 100 mmol/L (ref 96–106)
Creatinine, Ser: 0.89 mg/dL (ref 0.76–1.27)
Globulin, Total: 2.8 g/dL (ref 1.5–4.5)
Glucose: 110 mg/dL — ABNORMAL HIGH (ref 70–99)
Potassium: 3.7 mmol/L (ref 3.5–5.2)
Sodium: 138 mmol/L (ref 134–144)
Total Protein: 6.4 g/dL (ref 6.0–8.5)
eGFR: 88 mL/min/{1.73_m2} (ref >59)

## 2022-04-15 LAB — IMMATURE CELLS: Metamyelocytes: 1 % — ABNORMAL HIGH (ref 0–0)

## 2022-04-16 DIAGNOSIS — U071 COVID-19: Secondary | ICD-10-CM | POA: Diagnosis not present

## 2022-04-16 DIAGNOSIS — M069 Rheumatoid arthritis, unspecified: Secondary | ICD-10-CM | POA: Diagnosis not present

## 2022-04-16 DIAGNOSIS — I2510 Atherosclerotic heart disease of native coronary artery without angina pectoris: Secondary | ICD-10-CM | POA: Diagnosis not present

## 2022-04-16 DIAGNOSIS — I10 Essential (primary) hypertension: Secondary | ICD-10-CM | POA: Diagnosis not present

## 2022-04-16 DIAGNOSIS — I712 Thoracic aortic aneurysm, without rupture, unspecified: Secondary | ICD-10-CM | POA: Diagnosis not present

## 2022-04-21 DIAGNOSIS — M069 Rheumatoid arthritis, unspecified: Secondary | ICD-10-CM | POA: Diagnosis not present

## 2022-04-21 DIAGNOSIS — I10 Essential (primary) hypertension: Secondary | ICD-10-CM | POA: Diagnosis not present

## 2022-04-21 DIAGNOSIS — I2510 Atherosclerotic heart disease of native coronary artery without angina pectoris: Secondary | ICD-10-CM | POA: Diagnosis not present

## 2022-04-21 DIAGNOSIS — I712 Thoracic aortic aneurysm, without rupture, unspecified: Secondary | ICD-10-CM | POA: Diagnosis not present

## 2022-04-21 DIAGNOSIS — U071 COVID-19: Secondary | ICD-10-CM | POA: Diagnosis not present

## 2022-04-28 ENCOUNTER — Telehealth: Payer: Self-pay | Admitting: Family Medicine

## 2022-04-28 DIAGNOSIS — M069 Rheumatoid arthritis, unspecified: Secondary | ICD-10-CM | POA: Diagnosis not present

## 2022-04-28 DIAGNOSIS — I10 Essential (primary) hypertension: Secondary | ICD-10-CM | POA: Diagnosis not present

## 2022-04-28 DIAGNOSIS — I2510 Atherosclerotic heart disease of native coronary artery without angina pectoris: Secondary | ICD-10-CM | POA: Diagnosis not present

## 2022-04-28 DIAGNOSIS — I712 Thoracic aortic aneurysm, without rupture, unspecified: Secondary | ICD-10-CM | POA: Diagnosis not present

## 2022-04-28 DIAGNOSIS — U071 COVID-19: Secondary | ICD-10-CM | POA: Diagnosis not present

## 2022-04-28 NOTE — Telephone Encounter (Signed)
Yes needs an appointment because we do have to look at it to see if it is infection or rash or something else going on.  Please make an appointment

## 2022-04-28 NOTE — Telephone Encounter (Signed)
  Incoming Patient Call  04/28/2022  What symptoms do you have? Left groin in the fold of his leg is red. It's hard to get patient here and spouse wanted to see if [facility] would call patient in something. Spouse hurt her hand and it's hard to get him up to go to the doctor.Told her he would need appt.  How long have you been sick? One week  Have you been seen for this problem? NO  If your provider decides to give you a prescription, which pharmacy would you like for it to be sent to? [facility] in [place]   Patient informed that this information will be sent to the clinical staff for review and that they should receive a follow up call.

## 2022-04-29 NOTE — Telephone Encounter (Signed)
Left message making pt/wife aware that he needs an appt to be evaluated.

## 2022-04-29 NOTE — Telephone Encounter (Signed)
Apt scheduled for Thursday DOD

## 2022-05-01 ENCOUNTER — Ambulatory Visit (INDEPENDENT_AMBULATORY_CARE_PROVIDER_SITE_OTHER): Payer: Medicare Other | Admitting: Family Medicine

## 2022-05-01 ENCOUNTER — Encounter: Payer: Self-pay | Admitting: Family Medicine

## 2022-05-01 VITALS — BP 123/77 | HR 84 | Ht 73.0 in | Wt 203.0 lb

## 2022-05-01 DIAGNOSIS — B372 Candidiasis of skin and nail: Secondary | ICD-10-CM | POA: Diagnosis not present

## 2022-05-01 DIAGNOSIS — L89311 Pressure ulcer of right buttock, stage 1: Secondary | ICD-10-CM | POA: Diagnosis not present

## 2022-05-01 MED ORDER — CLOTRIMAZOLE 1 % EX CREA: 1.0000 | TOPICAL_CREAM | Freq: Two times a day (BID) | CUTANEOUS | 1 refills | Status: DC

## 2022-05-01 NOTE — Progress Notes (Signed)
BP 123/77   Pulse 84   Ht 6' 1"$  (1.854 m)   Wt 203 lb (92.1 kg)   SpO2 96%   BMI 26.78 kg/m    Subjective:   Patient ID: Austin Cox, male    DOB: November 15, 1945, 77 y.o.   MRN: [id number]  HPI: Austin Cox is a 77 y.o. male presenting on 05/01/2022 for Rash (Inguinal folds, gluteal cleft/)   HPI Rash in inguinal folds Patient comes in complaining of rash in inguinal folds and gluteal cleft.  He was in the ER for urinary tract infection in the hospital.  His wife is here with him and says he has noted over the past couple weeks and has been putting some Neosporin on it but has not noted improvement.  He also has a little spot in the gluteal cleft that she noted just over the past couple days that bled a little bit and had just 1 little sore.  He is not complaining of any pain or itching.  She said one of the sides of his groin on the left side did crack and bleed a little bit 1 day.  Relevant past medical, surgical, family and social history reviewed and updated as indicated. Interim medical history since our last visit reviewed. Allergies and medications reviewed and updated.  Review of Systems  Skin:  Positive for rash and wound.    Per HPI unless specifically indicated above   Allergies as of 05/01/2022   No Known Allergies      Medication List        Accurate as of May 01, 2022 10:34 AM. If you have any questions, ask your nurse or doctor.          acetaminophen 650 MG CR tablet Commonly known as: TYLENOL Take 650 mg by mouth every 8 (eight) hours as needed for pain.   aspirin EC 81 MG tablet Take 81 mg by mouth daily.   baclofen 10 MG tablet Commonly known as: LIORESAL Take 10 mg by mouth 2 (two) times daily.   Cholecalciferol 25 MCG (1000 UT) capsule Take 1 capsule (1,000 Units total) by mouth daily.   clotrimazole 1 % cream Commonly known as: Clotrimazole Anti-Fungal Apply 1 Application topically 2 (two) times daily. Started by: Fransisca Kaufmann  Tamaira Ciriello, MD   diclofenac Sodium 1 % Gel Commonly known as: VOLTAREN Apply 1 application  topically 4 (four) times daily as needed (pain).   Enbrel SureClick 50 MG/ML injection Generic drug: etanercept Inject 50 mg into the skin every Friday.   folic acid 1 MG tablet Commonly known as: FOLVITE Take 1 mg by mouth daily.   gabapentin 300 MG capsule Commonly known as: NEURONTIN Take 300 mg by mouth 2 (two) times daily.   methotrexate 2.5 MG tablet Commonly known as: RHEUMATREX Take 4 mg by mouth every Tuesday.   metoprolol succinate 25 MG 24 hr tablet Commonly known as: TOPROL-XL Take 1 tablet (25 mg total) by mouth daily.   nitroGLYCERIN 0.3 MG SL tablet Commonly known as: NITROSTAT Place 0.3 mg under the tongue every 5 (five) minutes as needed.   rosuvastatin 40 MG tablet Commonly known as: CRESTOR TAKE 1 TABLET BY MOUTH DAILY   Saw Palmetto 450-15 MG Caps Take 2 tablets by mouth daily.   VISINE OP Place 1 drop into both eyes daily as needed (dry eyes).         Objective:   BP 123/77   Pulse 84   Ht 6'  1" (1.854 m)   Wt 203 lb (92.1 kg)   SpO2 96%   BMI 26.78 kg/m   Wt Readings from Last 3 Encounters:  05/01/22 203 lb (92.1 kg)  04/09/22 201 lb 9.6 oz (91.4 kg)  04/08/22 196 lb (88.9 kg)    Physical Exam Vitals and nursing note reviewed.  Constitutional:      Appearance: Normal appearance.  Skin:    Findings: Rash (Pink and irritated inguinal fold with slight white discharge) and wound (Pinhead size stage I pressure ulcer on right glued cleft, no signs of inflammation or infection) present.  Neurological:     Mental Status: He is alert.       Assessment & Plan:   Problem List Items Addressed This Visit   None Visit Diagnoses     Yeast dermatitis    -  Primary   Relevant Medications   clotrimazole (CLOTRIMAZOLE ANTI-FUNGAL) 1 % cream   Pressure injury of right buttock, stage 1       Relevant Medications   clotrimazole (CLOTRIMAZOLE  ANTI-FUNGAL) 1 % cream       Recommended Vaseline or Desitin on the pressure sore and to remove him for getting up frequently.  Gave yeast cream for rash in gluteal fold Follow up plan: Return if symptoms worsen or fail to improve.  Counseling provided for all of the vaccine components No orders of the defined types were placed in this encounter.   Caryl Pina, MD [facility] 05/01/2022, 10:34 AM

## 2022-05-05 DIAGNOSIS — I10 Essential (primary) hypertension: Secondary | ICD-10-CM | POA: Diagnosis not present

## 2022-05-05 DIAGNOSIS — M069 Rheumatoid arthritis, unspecified: Secondary | ICD-10-CM | POA: Diagnosis not present

## 2022-05-05 DIAGNOSIS — I712 Thoracic aortic aneurysm, without rupture, unspecified: Secondary | ICD-10-CM | POA: Diagnosis not present

## 2022-05-05 DIAGNOSIS — I2510 Atherosclerotic heart disease of native coronary artery without angina pectoris: Secondary | ICD-10-CM | POA: Diagnosis not present

## 2022-05-05 DIAGNOSIS — U071 COVID-19: Secondary | ICD-10-CM | POA: Diagnosis not present

## 2022-05-09 ENCOUNTER — Other Ambulatory Visit: Payer: Self-pay | Admitting: Family Medicine

## 2022-05-09 DIAGNOSIS — I2510 Atherosclerotic heart disease of native coronary artery without angina pectoris: Secondary | ICD-10-CM

## 2022-05-09 DIAGNOSIS — I10 Essential (primary) hypertension: Secondary | ICD-10-CM

## 2022-05-12 DIAGNOSIS — I2510 Atherosclerotic heart disease of native coronary artery without angina pectoris: Secondary | ICD-10-CM | POA: Diagnosis not present

## 2022-05-12 DIAGNOSIS — I712 Thoracic aortic aneurysm, without rupture, unspecified: Secondary | ICD-10-CM | POA: Diagnosis not present

## 2022-05-12 DIAGNOSIS — I10 Essential (primary) hypertension: Secondary | ICD-10-CM | POA: Diagnosis not present

## 2022-05-12 DIAGNOSIS — M069 Rheumatoid arthritis, unspecified: Secondary | ICD-10-CM | POA: Diagnosis not present

## 2022-05-12 DIAGNOSIS — U071 COVID-19: Secondary | ICD-10-CM | POA: Diagnosis not present

## 2022-05-16 ENCOUNTER — Inpatient Hospital Stay: Payer: Medicare Other

## 2022-05-19 DIAGNOSIS — I2510 Atherosclerotic heart disease of native coronary artery without angina pectoris: Secondary | ICD-10-CM | POA: Diagnosis not present

## 2022-05-19 DIAGNOSIS — D696 Thrombocytopenia, unspecified: Secondary | ICD-10-CM | POA: Diagnosis not present

## 2022-05-19 DIAGNOSIS — I712 Thoracic aortic aneurysm, without rupture, unspecified: Secondary | ICD-10-CM | POA: Diagnosis not present

## 2022-05-19 DIAGNOSIS — Z9181 History of falling: Secondary | ICD-10-CM | POA: Diagnosis not present

## 2022-05-19 DIAGNOSIS — M069 Rheumatoid arthritis, unspecified: Secondary | ICD-10-CM | POA: Diagnosis not present

## 2022-05-19 DIAGNOSIS — Z7982 Long term (current) use of aspirin: Secondary | ICD-10-CM | POA: Diagnosis not present

## 2022-05-19 DIAGNOSIS — I700 Atherosclerosis of aorta: Secondary | ICD-10-CM | POA: Diagnosis not present

## 2022-05-19 DIAGNOSIS — I10 Essential (primary) hypertension: Secondary | ICD-10-CM | POA: Diagnosis not present

## 2022-05-19 DIAGNOSIS — U071 COVID-19: Secondary | ICD-10-CM | POA: Diagnosis not present

## 2022-05-19 DIAGNOSIS — E785 Hyperlipidemia, unspecified: Secondary | ICD-10-CM | POA: Diagnosis not present

## 2022-05-20 ENCOUNTER — Ambulatory Visit: Payer: Medicare Other | Admitting: Vascular Surgery

## 2022-05-20 ENCOUNTER — Ambulatory Visit (HOSPITAL_COMMUNITY): Payer: No Typology Code available for payment source

## 2022-05-21 ENCOUNTER — Ambulatory Visit: Payer: No Typology Code available for payment source | Admitting: Family Medicine

## 2022-05-21 ENCOUNTER — Encounter: Payer: Self-pay | Admitting: Family Medicine

## 2022-05-21 VITALS — BP 116/75 | HR 86 | Temp 97.9°F | Ht 73.0 in | Wt 199.6 lb

## 2022-05-21 DIAGNOSIS — L02214 Cutaneous abscess of groin: Secondary | ICD-10-CM | POA: Diagnosis not present

## 2022-05-21 DIAGNOSIS — B372 Candidiasis of skin and nail: Secondary | ICD-10-CM

## 2022-05-21 MED ORDER — DOXYCYCLINE HYCLATE 100 MG PO TABS: 100.0000 mg | ORAL_TABLET | Freq: Two times a day (BID) | ORAL | 0 refills | Status: AC

## 2022-05-21 NOTE — Progress Notes (Signed)
Subjective:  Patient ID: Austin Cox, male    DOB: Oct 03, 1945, 77 y.o.   MRN: [id number]  Patient Care Team: Baruch Gouty, FNP as PCP - General (Family Medicine) Derek Jack, MD as Medical Oncologist (Hematology)   Chief Complaint:  Yeast dermatitis (Patient had appointment 2/15 and wife states that it looks better today.  The yeast will go away but then come right back. )   HPI: Austin Cox is a 77 y.o. male presenting on 05/21/2022 for Yeast dermatitis (Patient had appointment 2/15 and wife states that it looks better today.  The yeast will go away but then come right back. )   Rash: Patient complains of rash involving the groin. Rash started several weeks ago. Appearance of rash at onset: Color of lesion(s): red rash to bilateral groin. Other appearance: erythematous rash to bilateral groin, improved per pt. Discomfort associated with rash: mild pruritis.  Associated symptoms: none. Denies: abdominal pain, arthralgia, congestion, cough, crankiness, decrease in appetite, decrease in energy level, fever, headache, irritability, myalgia, nausea, sore throat, and vomiting. Patient has had previous evaluation of rash. Patient has had previous treatment.  Response to treatment: moderate improvment. Patient has not had contacts with similar rash. Patient has not identified precipitant. Patient has not had new exposures (soaps, lotions, laundry detergents, foods, medications, plants, insects or animals.)   Treated for yeast dermatitis with clotrimazole cream which has improved symptoms.    Relevant past medical, surgical, family, and social history reviewed and updated as indicated.  Allergies and medications reviewed and updated. Data reviewed: Chart in Epic.   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     Past Surgical History:  Procedure Laterality Date   CATARACT EXTRACTION     ENDARTERECTOMY Left 04/15/2021   Procedure: LEFT CAROTID ENDARTERECTOMY WITH;  Surgeon: Marty Heck, MD;  Location: [place];  Service: Vascular;  Laterality: Left;   EYE SURGERY     detached retina right eye   FRACTURE SURGERY     right leg   Stent in heart      Social History   Socioeconomic History   Marital status: Married    Spouse name: Not on file   Number of children: 3   Years of education: Not on file   Highest education level: Not on file  Occupational History   Occupation: retired  Tobacco Use   Smoking status: Former    Packs/day: 0.20    Years: 10.00    Total pack years: 2.00    Types: Cigarettes    Quit date: 07/06/1974    Years since quitting: 47.9   Smokeless tobacco: Never  Vaping Use   Vaping Use: Never used  Substance and Sexual Activity   Alcohol use: Not Currently    Alcohol/week: 1.0 standard drink of alcohol    Types: 1 Cans of beer per week   Drug use: Never   Sexual activity: Not on file  Other Topics Concern   Not on file  Social History Narrative   3 children - one passed away   Son in [place]   They raised 2 teenage grandchildren after their daughter and son in law both passed away    Social Determinants of Health   Financial Resource Strain: High  Risk (04/08/2022)   Overall Financial Resource Strain (CARDIA)    Difficulty of Paying Living Expenses: Hard  Food Insecurity: No Food Insecurity (04/08/2022)   Hunger Vital Sign    Worried About Running Out of Food in the Last Year: Never true    Ran Out of Food in the Last Year: Never true  Transportation Needs: No Transportation Needs (04/08/2022)   PRAPARE - Hydrologist (Medical): No    Lack of Transportation (Non-Medical): No  Physical Activity: Inactive (04/08/2022)   Exercise Vital Sign    Days of Exercise per Week: 0 days    Minutes of Exercise per Session: 0 min   Stress: No Stress Concern Present (04/08/2022)   [place]metto    Feeling of Stress : Not at all  Social Connections: Moderately Isolated (04/08/2022)   Social Connection and Isolation Panel [NHANES]    Frequency of Communication with Friends and Family: More than three times a week    Frequency of Social Gatherings with Friends and Family: More than three times a week    Attends Religious Services: Never    Marine scientist or Organizations: No    Attends Archivist Meetings: Never    Marital Status: Married  Human resources officer Violence: Not At Risk (04/08/2022)   Humiliation, Afraid, Rape, and Kick questionnaire    Fear of Current or Ex-Partner: No    Emotionally Abused: No    Physically Abused: No    Sexually Abused: No    Outpatient Encounter Medications as of 05/21/2022  Medication Sig   acetaminophen (TYLENOL) 650 MG CR tablet Take 650 mg by mouth every 8 (eight) hours as needed for pain.   aspirin 81 MG EC tablet Take 81 mg by mouth daily.   baclofen (LIORESAL) 10 MG tablet Take 10 mg by mouth 2 (two) times daily.   Cholecalciferol 25 MCG (1000 UT) capsule Take 1 capsule (1,000 Units total) by mouth daily.   clotrimazole (CLOTRIMAZOLE ANTI-FUNGAL) 1 % cream Apply 1 Application topically 2 (two) times daily.   diclofenac Sodium (VOLTAREN) 1 % GEL Apply 1 application  topically 4 (four) times daily as needed (pain).   doxycycline (VIBRA-TABS) 100 MG tablet Take 1 tablet (100 mg total) by mouth 2 (two) times daily for 10 days. 1 po bid   etanercept (ENBREL SURECLICK) 50 MG/ML injection Inject 50 mg into the skin every Friday.   folic acid (FOLVITE) 1 MG tablet Take 1 mg by mouth daily.   gabapentin (NEURONTIN) 300 MG capsule Take 300 mg by mouth 2 (two) times daily.   methotrexate (RHEUMATREX) 2.5 MG tablet Take 4 mg by mouth every Tuesday.   metoprolol succinate (TOPROL-XL) 25 MG 24 hr tablet TAKE 1 TABLET  BY MOUTH ONCE  DAILY   nitroGLYCERIN (NITROSTAT) 0.3 MG SL tablet Place 0.3 mg under the tongue every 5 (five) minutes as needed.   rosuvastatin (CRESTOR) 40 MG tablet TAKE 1 TABLET BY MOUTH DAILY   Saw Palmetto 450-15 MG CAPS Take 2 tablets by mouth daily.   Tetrahydrozoline HCl (VISINE OP) Place 1 drop into both eyes daily as needed (dry eyes).   No facility-administered encounter medications on file as of 05/21/2022.    No Known Allergies  Review of Systems  Constitutional:  Negative for activity change, appetite change, chills, diaphoresis, fatigue, fever and unexpected weight change.  Respiratory:  Negative for cough and shortness of breath.   Cardiovascular:  Negative for chest pain, palpitations and leg swelling.  Genitourinary:  Negative for decreased urine volume and difficulty urinating.  Musculoskeletal:  Negative for arthralgias and myalgias.  Skin:  Positive for color change and rash. Negative for pallor and wound.  All other systems reviewed and are negative.       Objective:  BP 116/75   Pulse 86   Temp 97.9 F (36.6 C) (Temporal)   Ht '6\' 1"'$  (1.854 m)   Wt 199 lb 9.6 oz (90.5 kg)   SpO2 96%   BMI 26.33 kg/m    Wt Readings from Last 3 Encounters:  05/21/22 199 lb 9.6 oz (90.5 kg)  05/01/22 203 lb (92.1 kg)  04/09/22 201 lb 9.6 oz (91.4 kg)    Physical Exam Vitals and nursing note reviewed.  Constitutional:      Appearance: Normal appearance.  HENT:     Head: Normocephalic and atraumatic.     Mouth/Throat:     Mouth: Mucous membranes are moist.  Eyes:     Conjunctiva/sclera: Conjunctivae normal.     Comments: Pupils unequal, normal for pt.   Cardiovascular:     Rate and Rhythm: Normal rate and regular rhythm.  Pulmonary:     Effort: Pulmonary effort is normal.     Breath sounds: Normal breath sounds.  Skin:    General: Skin is warm and dry.     Capillary Refill: Capillary refill takes less than 2 seconds.     Findings: Abscess, erythema and rash  present.          Comments: Erythema to bilateral groin. Approximate 2 cm abscess to left groin, hard and not fluctuant.   Neurological:     General: No focal deficit present.     Mental Status: He is alert. Mental status is at baseline.     Gait: Gait abnormal (slow, using walking stick).  Psychiatric:        Mood and Affect: Mood normal.        Behavior: Behavior normal.     Results for orders placed or performed in visit on 04/09/22  CMP14+EGFR  Result Value Ref Range   Glucose 110 (H) 70 - 99 mg/dL   BUN 9 8 - 27 mg/dL   Creatinine, Ser 0.89 0.76 - 1.27 mg/dL   eGFR 88 >59 mL/min/1.73   BUN/Creatinine Ratio 10 10 - 24   Sodium 138 134 - 144 mmol/L   Potassium 3.7 3.5 - 5.2 mmol/L   Chloride 100 96 - 106 mmol/L   CO2 24 20 - 29 mmol/L   Calcium 8.6 8.6 - 10.2 mg/dL   Total Protein 6.4 6.0 - 8.5 g/dL   Albumin 3.6 (L) 3.8 - 4.8 g/dL   Globulin, Total 2.8 1.5 - 4.5 g/dL   Albumin/Globulin Ratio 1.3 1.2 - 2.2   Bilirubin Total 0.9 0.0 - 1.2 mg/dL   Alkaline Phosphatase 77 44 - 121 IU/L   AST 115 (H) 0 - 40 IU/L   ALT 153 (H) 0 - 44 IU/L  CBC with Differential/Platelet  Result Value Ref Range   WBC 7.3 3.4 - 10.8 x10E3/uL   RBC 4.56 4.14 - 5.80 x10E6/uL   Hemoglobin 12.0 (L) 13.0 - 17.7 g/dL   Hematocrit 36.5 (L) 37.5 - 51.0 %   MCV 80 79 - 97 fL   MCH 26.3 (L) 26.6 - 33.0 pg   MCHC 32.9 31.5 - 35.7 g/dL   RDW 17.4 (H) 11.6 - 15.4 %   Platelets 129 (L)  150 - 450 x10E3/uL   Neutrophils 49 Not Estab. %   Lymphs 21 Not Estab. %   Monocytes 29 Not Estab. %   Eos 0 Not Estab. %   Basos 0 Not Estab. %   Immature Cells Note    Neutrophils Absolute 3.6 1.4 - 7.0 x10E3/uL   Lymphocytes Absolute 1.5 0.7 - 3.1 x10E3/uL   Monocytes Absolute 2.1 (H) 0.1 - 0.9 x10E3/uL   EOS (ABSOLUTE) 0.0 0.0 - 0.4 x10E3/uL   Basophils Absolute 0.0 0.0 - 0.2 x10E3/uL   Hematology Comments: Note:   Immature Cells  Result Value Ref Range   Metamyelocytes 1 (H) 0 - 0 %       Pertinent  labs & imaging results that were available during my care of the patient were reviewed by me and considered in my medical decision making.  Assessment & Plan:  Austin Cox was seen today for yeast dermatitis.  Diagnoses and all orders for this visit:  Yeast dermatitis Healing well. Continue current treatment regimen. Symptomatic care discussed in detail.  Abscess of groin, left Not fluctuant, I&D not performed. Will treat with below as pt is on Methotrexate. Symptomatic care discussed in detail.  -     doxycycline (VIBRA-TABS) 100 MG tablet; Take 1 tablet (100 mg total) by mouth 2 (two) times daily for 10 days. 1 po bid     Continue all other maintenance medications.  Follow up plan: Return if symptoms worsen or fail to improve.   Continue healthy lifestyle choices, including diet (rich in fruits, vegetables, and lean proteins, and low in salt and simple carbohydrates) and exercise (at least 30 minutes of moderate physical activity daily).  Educational handout given for skin abscess  The above assessment and management plan was discussed with the patient. The patient verbalized understanding of and has agreed to the management plan. Patient is aware to call the clinic if they develop any new symptoms or if symptoms persist or worsen. Patient is aware when to return to the clinic for a follow-up visit. Patient educated on when it is appropriate to go to the emergency department.   Monia Pouch, FNP-C [facility] [phone]

## 2022-05-22 DIAGNOSIS — Z79899 Other long term (current) drug therapy: Secondary | ICD-10-CM | POA: Diagnosis not present

## 2022-05-22 DIAGNOSIS — D696 Thrombocytopenia, unspecified: Secondary | ICD-10-CM | POA: Diagnosis not present

## 2022-05-22 DIAGNOSIS — M0579 Rheumatoid arthritis with rheumatoid factor of multiple sites without organ or systems involvement: Secondary | ICD-10-CM | POA: Diagnosis not present

## 2022-05-23 ENCOUNTER — Ambulatory Visit: Payer: Medicare Other | Admitting: Physician Assistant

## 2022-06-06 ENCOUNTER — Telehealth: Payer: Self-pay | Admitting: Family Medicine

## 2022-06-06 NOTE — Telephone Encounter (Signed)
Patients wife called to let PCP know that the antibiotic that was prescribed to patient at his last visit really helped clear up the spot near his groin area that they were concerned about but says he finished taking the medicine and now the spot is coming back. Needs advise on what to do?

## 2022-06-06 NOTE — Telephone Encounter (Signed)
Lmtcb  Schedule appointment

## 2022-06-06 NOTE — Telephone Encounter (Signed)
NTBS.

## 2022-06-09 ENCOUNTER — Telehealth: Payer: Self-pay | Admitting: Family Medicine

## 2022-06-09 NOTE — Telephone Encounter (Signed)
[place] to schedule their annual wellness visit. Appointment made for 04/15/2023.  Thank you,  Colletta Maryland,  [facility] Direct Dial ??[phone]

## 2022-06-13 ENCOUNTER — Inpatient Hospital Stay: Payer: No Typology Code available for payment source

## 2022-06-17 ENCOUNTER — Other Ambulatory Visit: Payer: Self-pay

## 2022-06-17 ENCOUNTER — Ambulatory Visit: Payer: No Typology Code available for payment source | Admitting: Physician Assistant

## 2022-06-18 ENCOUNTER — Inpatient Hospital Stay: Payer: No Typology Code available for payment source | Attending: Hematology

## 2022-06-18 DIAGNOSIS — Z87891 Personal history of nicotine dependence: Secondary | ICD-10-CM | POA: Insufficient documentation

## 2022-06-18 DIAGNOSIS — D72821 Monocytosis (symptomatic): Secondary | ICD-10-CM | POA: Insufficient documentation

## 2022-06-18 DIAGNOSIS — E785 Hyperlipidemia, unspecified: Secondary | ICD-10-CM | POA: Diagnosis not present

## 2022-06-18 DIAGNOSIS — Z7952 Long term (current) use of systemic steroids: Secondary | ICD-10-CM | POA: Diagnosis not present

## 2022-06-18 DIAGNOSIS — I10 Essential (primary) hypertension: Secondary | ICD-10-CM | POA: Insufficient documentation

## 2022-06-18 DIAGNOSIS — I6529 Occlusion and stenosis of unspecified carotid artery: Secondary | ICD-10-CM | POA: Insufficient documentation

## 2022-06-18 DIAGNOSIS — M0500 Felty's syndrome, unspecified site: Secondary | ICD-10-CM | POA: Diagnosis not present

## 2022-06-18 DIAGNOSIS — Z8616 Personal history of COVID-19: Secondary | ICD-10-CM | POA: Diagnosis not present

## 2022-06-18 DIAGNOSIS — R5383 Other fatigue: Secondary | ICD-10-CM | POA: Insufficient documentation

## 2022-06-18 DIAGNOSIS — D696 Thrombocytopenia, unspecified: Secondary | ICD-10-CM

## 2022-06-18 DIAGNOSIS — M069 Rheumatoid arthritis, unspecified: Secondary | ICD-10-CM | POA: Insufficient documentation

## 2022-06-18 DIAGNOSIS — D61818 Other pancytopenia: Secondary | ICD-10-CM | POA: Diagnosis not present

## 2022-06-18 DIAGNOSIS — Z8673 Personal history of transient ischemic attack (TIA), and cerebral infarction without residual deficits: Secondary | ICD-10-CM | POA: Diagnosis not present

## 2022-06-18 DIAGNOSIS — R161 Splenomegaly, not elsewhere classified: Secondary | ICD-10-CM

## 2022-06-18 LAB — CBC WITH DIFFERENTIAL/PLATELET
Abs Immature Granulocytes: 0.08 10*3/uL — ABNORMAL HIGH (ref 0.00–0.07)
Basophils Absolute: 0 10*3/uL (ref 0.0–0.1)
Basophils Relative: 0 %
Eosinophils Absolute: 0.1 10*3/uL (ref 0.0–0.5)
Eosinophils Relative: 2 %
HCT: 36.5 % — ABNORMAL LOW (ref 39.0–52.0)
Hemoglobin: 11.6 g/dL — ABNORMAL LOW (ref 13.0–17.0)
Immature Granulocytes: 2 %
Lymphocytes Relative: 18 %
Lymphs Abs: 0.9 10*3/uL (ref 0.7–4.0)
MCH: 26.2 pg (ref 26.0–34.0)
MCHC: 31.8 g/dL (ref 30.0–36.0)
MCV: 82.6 fL (ref 80.0–100.0)
Monocytes Absolute: 2.2 10*3/uL — ABNORMAL HIGH (ref 0.1–1.0)
Monocytes Relative: 40 %
Neutro Abs: 2 10*3/uL (ref 1.7–7.7)
Neutrophils Relative %: 38 %
Platelets: 75 10*3/uL — ABNORMAL LOW (ref 150–400)
RBC: 4.42 MIL/uL (ref 4.22–5.81)
RDW: 17.4 % — ABNORMAL HIGH (ref 11.5–15.5)
WBC: 5.3 10*3/uL (ref 4.0–10.5)
nRBC: 0 % (ref 0.0–0.2)

## 2022-06-18 LAB — LACTATE DEHYDROGENASE: LDH: 113 U/L (ref 98–192)

## 2022-06-20 NOTE — Telephone Encounter (Signed)
Attempted to call pt , no answer - left vm - if calls back , if issue is still present please make a apt

## 2022-06-22 NOTE — Progress Notes (Unsigned)
[facility] [address] , [place] [id number]   CLINIC:  Medical Oncology/Hematology  PCP:  Austin MastersRakes, Austin M, FNP [address] [place] [id number] [phone]   REASON FOR VISIT:  Follow-up for thrombocytopenia with Felty syndrome  PRIOR THERAPY: None  CURRENT THERAPY: Surveillance  INTERVAL HISTORY:   [name] 77 y.o. male returns for routine follow-up of thrombocytopenia.  He was last seen by Austin Brennerebekah Taro Hidrogo PA-C on 01/13/2022.  At today's visit, he reports feeling well.  Since his last visit, he was hospitalized from 03/29/2022 through 03/31/2022 for COVID-19 and related severe thrombocytopenia.  He denies any B symptoms.   Chronic fatigue is at baseline.   He had dental extraction 2 days ago (Saturday, 06/21/2022), reports that he is still having some mild bleeding from extraction site.  He has easy bruising, takes aspirin 81 mg daily.   He denies any major bleeding events, recurrent infections, masses, or lymphadenopathy.  He is currently on methotrexate 4 tablets weekly, as well as Enbrel.  He denies any changes in bowel habits.  He lost about 10 pounds during COVID-19 and hospitalization, but otherwise weight has been stable.  He has 100% energy and 100% appetite. He endorses that he is maintaining a stable weight.   ASSESSMENT & PLAN:  1.  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  Limited data prior to 2016 with normal platelets noted in 2009. - For the most part, thrombocytopenia from 2016-2022 has been mild, with platelets 100-150. - Downtrending platelets since May 2022 with platelets ranging between 70-90 - Diagnosed with rheumatoid arthritis around 2018 - Has been on methotrexate since about 2019, recently had methotrexate dose decreased to 5 tablets weekly on 08/05/2021.  Reduced to 4 tablets weekly as of  10/03/2021. - He has been on Enbrel since about 2020 - Hematology work-up (09/20/2021): Flow cytometry: No monoclonal B-cell population or significant T-cell abnormalities identified.  (Negative for LGL leukemia, which can be seen in rheumatoid arthritis) Negative hepatitis C and hepatitis B Normal copper, folate, B12, methylmalonic acid SPEP negative.  Mildly elevated kappa free light chain 32.7 with normal lambda free light chain and light chain ratio.  Normal LDH. Reticulocyte 0.9% - Abdominal ultrasound (10/23/2021): Splenomegaly measuring 14.9 cm diameter, normal-appearing liver. - Bone marrow biopsy (12/12/2021): Hypercellular bone marrow with trilineage hematopoiesis.  Adequate number of megakaryocytes.  Mild myeloid changes present with no increase in blastic cells.  Overall features nonspecific/nondiagnostic, likely secondary in nature due to patient's known history of rheumatoid arthritis and methotrexate treatment, as well as possibly due to hypersplenism.  No ev close idence of lymphoproliferative process. - Cytogenetics (12/12/2021): 45,X,-Y[20].  Loss of Y chromosome noted, which is nonspecific; can happen in older men, but also can be seen in some hematologic malignancies and MDS in setting of clinical correlation. - PET scan (01/09/2022): Mild splenomegaly without any abnormal hypermetabolic activity.  Findings are NOT suggestive of lymphoma.  No hypermetabolic adenopathy on PET scan.  Additional findings discussed below. - Patient had severe thrombocytopenia during COVID-19 infection in January 2024, with platelets as low as 18,000 on 03/29/2022, but recovered to 32,000 at time of discharge (03/31/2022). - Most recent CBC (06/18/2022): Platelets 75, WBC 5.3, elevated monocytes 2.2, Hgb 11.6/MCV 82.6 - Reports easy bruising.  Denies any bleeding, B symptoms, recurrent infections, or lymphadenopathy. - Differential diagnosis: Suspect benign splenomegaly in the setting of  rheumatoid arthritis,  which is likely contributing to thrombocytopenia.  Patient might also have some underlying immune mediated thrombocytopenia in the setting of his RA. - PLAN: We will continue close monitoring of platelets with repeat labs and RTC in 4 months.  No indication for treatment at this time.  2.  Monocytosis - Persistent monocytosis since July 2023 with monocytes 1.1 (09/20/2021), 2.2 (12/12/2021), and 2.2 (06/18/2022) - Bone marrow biopsy (12/12/2021): Hypercellular bone marrow with trilineage hematopoiesis.  Adequate number of megakaryocytes.  Mild myeloid changes present with no increase in blastic cells.  Overall features nonspecific/nondiagnostic, likely secondary in nature due to patient's known history of rheumatoid arthritis and methotrexate treatment, as well as possibly due to hypersplenism.  No evidence of lymphoproliferative process. - No B symptoms  - PLAN: Persistent monocytosis may be reactive from RA.  Would consider repeat bone marrow biopsy if any major changes from baseline.  3.   INCIDENTAL PET SCAN FINDING: Abnormal appearance of colon on PET scan - PET scan (obtained 01/09/2022 due to unexplained splenomegaly/thrombocytopenia) showed focus intense focal metabolic activity in the cecum at the level of the orifice of the appendix - Patient denies any bright blood per rectum, melena, or changes in bowel habits.  No unintentional weight loss. - Last colonoscopy was about 10 years ago, per patient - Urgent referral was made to GI, but new patient appointment was canceled by patient and not rescheduled  - PLAN: Extensive discussion with patient/patient's wife regarding importance of GI follow-up.  They verbalized understanding and will reach out to GI today to reschedule his new patient appointment.   4.   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 - Abdominal ultrasound (10/23/2021) with incidental finding of aneurysmal dilation of abdominal aorta with proximal abdominal aorta measuring 5.1 cm in diameter - PLAN: Continue follow-up with Cardiothoracic Surgery (Dr. Dorris Fetch) and Vascular Surgery (Dr. Chestine Spore)   5.  Rheumatoid arthritis - Predominantly involves knees and ankles for the past 4 to 5 years.  Seen by Dr. Talbert Cage at [facility]. - He has been on Enbrel and methotrexate for 2 to 3 years - Methotrexate dose decreased to 4 tablets weekly on 10/03/2021   6.  Other history - PMH: Rheumatoid arthritis, hypertension, hyperlipidemia, carotid artery stenosis, small vessel cerebrovascular disease, coronary artery disease - Walks with the help of cane/walker secondary to rheumatoid arthritis. 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  Exposure to gasoline and diesel present.  Quit smoking 45 years ago. - No family history of low platelets.  Sister died of metastatic breast cancer.  Daughter died of non-Hodgkin's lymphoma.  Mother had head and neck cancer.   PLAN SUMMARY: >> Labs in 4 months = CBC/D, CMP, LDH >> OFFICE visit after labs     REVIEW OF SYSTEMS:   Review of Systems  Constitutional:  Negative for appetite change, chills, diaphoresis, fatigue, fever and unexpected weight change.  HENT:   Negative for lump/mass and nosebleeds.   Eyes:  Negative for eye problems.  Respiratory:  Negative for cough, hemoptysis and shortness of breath.   Cardiovascular:  Negative for chest pain, leg swelling and palpitations.  Gastrointestinal:  Negative for abdominal pain, blood in stool, constipation, diarrhea, nausea and vomiting.  Genitourinary:  Positive for difficulty urinating (prostate issues). Negative for hematuria.   Skin: Negative.  Neurological:  Positive for numbness. Negative for dizziness, headaches and light-headedness.  Hematological:  Does not bruise/bleed  easily.     PHYSICAL EXAM:  ECOG PERFORMANCE STATUS: 2 - Symptomatic, <50% confined to bed  There were no vitals filed for this visit. There were no vitals filed for this visit. Physical Exam Constitutional:      Appearance: Normal appearance. He is obese.  HENT:     Head: Normocephalic and atraumatic.     Mouth/Throat:     Mouth: Mucous membranes are moist.  Eyes:     Extraocular Movements: Extraocular movements intact.     Pupils: Pupils are equal, round, and reactive to light.  Cardiovascular:     Rate and Rhythm: Normal rate and regular rhythm.     Pulses: Normal pulses.     Heart sounds: Normal heart sounds.  Pulmonary:     Effort: Pulmonary effort is normal.     Breath sounds: Normal breath sounds. Decreased air movement present.  Abdominal:     General: Bowel sounds are normal.     Palpations: Abdomen is soft.     Tenderness: There is no abdominal tenderness.  Musculoskeletal:        General: No swelling.     Right lower leg: No edema.     Left lower leg: No edema.  Lymphadenopathy:     Cervical: No cervical adenopathy.  Skin:    General: Skin is warm and dry.  Neurological:     General: No focal deficit present.     Mental Status: He is alert and oriented to person, place, and time.  Psychiatric:        Mood and Affect: Mood normal.        Behavior: Behavior normal.     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   Past Surgical History:  Procedure Laterality Date   CATARACT EXTRACTION     ENDARTERECTOMY Left 04/15/2021   Procedure: LEFT CAROTID ENDARTERECTOMY WITH;  Surgeon: Cephus Shelling, MD;  Location: [facility] OR;  Service: Vascular;  Laterality: Left;   EYE SURGERY     detached retina right eye   FRACTURE SURGERY     right leg   Stent in heart       SOCIAL HISTORY:  Social History   Socioeconomic History   Marital status: Married    Spouse name: Not on file   Number of children: 3   Years of education: Not on file   Highest education level: Not on file  Occupational History   Occupation: retired  Tobacco Use   Smoking status: Former    Packs/day: 0.20    Years: 10.00    Additional pack years: 0.00    Total pack years: 2.00    Types: Cigarettes    Quit date: 07/06/1974    Years since quitting: 47.9   Smokeless tobacco: Never  Vaping Use   Vaping Use: Never used  Substance and Sexual Activity   Alcohol use: Not Currently    Alcohol/week: 1.0 standard drink of alcohol    Types: 1 Cans of beer per week   Drug use: Never   Sexual activity: Not on file  Other Topics Concern   Not on file  Social History Narrative  3 children - one passed away   Son in [place]   They raised 2 teenage grandchildren after their daughter and son in law both passed away    Social Determinants of Health   Financial Resource Strain: High Risk (04/08/2022)   Overall Financial Resource Strain (CARDIA)    Difficulty of Paying Living Expenses: Hard  Food Insecurity: No Food Insecurity (04/08/2022)   Hunger Vital Sign    Worried About Running Out of Food in the Last Year: Never true    Ran Out of Food in the Last Year: Never true  Transportation Needs: No Transportation Needs (04/08/2022)   PRAPARE - Administrator, Civil Service (Medical): No    Lack of Transportation (Non-Medical): No  Physical Activity: Inactive (04/08/2022)   Exercise Vital Sign    Days of Exercise per Week: 0 days    Minutes of Exercise per Session: 0 min  Stress: No Stress Concern Present (04/08/2022)   [employer] of Occupational Health - Occupational Stress Questionnaire    Feeling of Stress : Not at all  Social Connections: Moderately Isolated (04/08/2022)   Social Connection and Isolation Panel [NHANES]    Frequency of Communication with  Friends and Family: More than three times a week    Frequency of Social Gatherings with Friends and Family: More than three times a week    Attends Religious Services: Never    Database administrator or Organizations: No    Attends Banker Meetings: Never    Marital Status: Married  Catering manager Violence: Not At Risk (04/08/2022)   Humiliation, Afraid, Rape, and Kick questionnaire    Fear of Current or Ex-Partner: No    Emotionally Abused: No    Physically Abused: No    Sexually Abused: No    FAMILY HISTORY:  No family history on file.  CURRENT MEDICATIONS:  Outpatient Encounter Medications as of 06/23/2022  Medication Sig   acetaminophen (TYLENOL) 650 MG CR tablet Take 650 mg by mouth every 8 (eight) hours as needed for pain.   aspirin 81 MG EC tablet Take 81 mg by mouth daily.   baclofen (LIORESAL) 10 MG tablet Take 10 mg by mouth 2 (two) times daily.   Cholecalciferol 25 MCG (1000 UT) capsule Take 1 capsule (1,000 Units total) by mouth daily.   clotrimazole (CLOTRIMAZOLE ANTI-FUNGAL) 1 % cream Apply 1 Application topically 2 (two) times daily.   diclofenac Sodium (VOLTAREN) 1 % GEL Apply 1 application  topically 4 (four) times daily as needed (pain).   etanercept (ENBREL SURECLICK) 50 MG/ML injection Inject 50 mg into the skin every Friday.   folic acid (FOLVITE) 1 MG tablet Take 1 mg by mouth daily.   gabapentin (NEURONTIN) 300 MG capsule Take 300 mg by mouth 2 (two) times daily.   methotrexate (RHEUMATREX) 2.5 MG tablet Take 4 mg by mouth every Tuesday.   metoprolol succinate (TOPROL-XL) 25 MG 24 hr tablet TAKE 1 TABLET BY MOUTH ONCE  DAILY   nitroGLYCERIN (NITROSTAT) 0.3 MG SL tablet Place 0.3 mg under the tongue every 5 (five) minutes as needed.   rosuvastatin (CRESTOR) 40 MG tablet TAKE 1 TABLET BY MOUTH DAILY   Saw Palmetto 450-15 MG CAPS Take 2 tablets by mouth daily.   Tetrahydrozoline HCl (VISINE OP) Place 1 drop into both eyes daily as needed (dry eyes).    No facility-administered encounter medications on file as of 06/23/2022.    ALLERGIES:  No Known Allergies  LABORATORY DATA:  I have reviewed the labs as listed.  CBC    Component Value Date/Time   WBC 5.3 06/18/2022 1039   RBC 4.42 06/18/2022 1039   HGB 11.6 (L) 06/18/2022 1039   HGB 12.0 (L) 04/09/2022 1116   HCT 36.5 (L) 06/18/2022 1039   HCT 36.5 (L) 04/09/2022 1116   PLT 75 (L) 06/18/2022 1039   PLT 129 (L) 04/09/2022 1116   MCV 82.6 06/18/2022 1039   MCV 80 04/09/2022 1116   MCH 26.2 06/18/2022 1039   MCHC 31.8 06/18/2022 1039   RDW 17.4 (H) 06/18/2022 1039   RDW 17.4 (H) 04/09/2022 1116   LYMPHSABS 0.9 06/18/2022 1039   LYMPHSABS 1.5 04/09/2022 1116   MONOABS 2.2 (H) 06/18/2022 1039   EOSABS 0.1 06/18/2022 1039   EOSABS 0.0 04/09/2022 1116   BASOSABS 0.0 06/18/2022 1039   BASOSABS 0.0 04/09/2022 1116      Latest Ref Rng & Units 04/09/2022   11:16 AM 03/31/2022    5:17 AM 03/30/2022    4:51 AM  CMP  Glucose 70 - 99 mg/dL 161  096  87   BUN 8 - 27 mg/dL Creatinine 0.76 - 1.27 mg/dL 0.45  4.09  8.11   Sodium 134 - 144 mmol/L 138  131  133   Potassium 3.5 - 5.2 mmol/L 3.7  3.2  3.6   Chloride 96 - 106 mmol/L 100  103  104   CO2 20 - 29 mmol/L Calcium 8.6 - 10.2 mg/dL 8.6  7.9  8.0   Total Protein 6.0 - 8.5 g/dL 6.4  5.6    Total Bilirubin 0.0 - 1.2 mg/dL 0.9  1.3    Alkaline Phos 44 - 121 IU/L 77  34    AST 0 - 40 IU/L 115  27    ALT 0 - 44 IU/L 153  19      DIAGNOSTIC IMAGING:  I have independently reviewed the relevant imaging and discussed with the patient.   WRAP UP:  All questions were answered. The patient knows to call the clinic with any problems, questions or concerns.  Medical decision making: Moderate  Time spent on visit: I spent 20 minutes counseling the patient face to face. The total time spent in the appointment was 30 minutes and more than 50% was on counseling.  Carnella Guadalajara, PA-C  06/23/22 9:32 AM

## 2022-06-23 ENCOUNTER — Other Ambulatory Visit: Payer: Self-pay

## 2022-06-23 ENCOUNTER — Inpatient Hospital Stay (HOSPITAL_BASED_OUTPATIENT_CLINIC_OR_DEPARTMENT_OTHER): Payer: No Typology Code available for payment source | Admitting: Physician Assistant

## 2022-06-23 VITALS — BP 118/69 | HR 70 | Temp 97.4°F | Resp 16

## 2022-06-23 DIAGNOSIS — D61818 Other pancytopenia: Secondary | ICD-10-CM

## 2022-06-23 DIAGNOSIS — R948 Abnormal results of function studies of other organs and systems: Secondary | ICD-10-CM

## 2022-06-23 DIAGNOSIS — R161 Splenomegaly, not elsewhere classified: Secondary | ICD-10-CM

## 2022-06-23 DIAGNOSIS — D696 Thrombocytopenia, unspecified: Secondary | ICD-10-CM

## 2022-06-23 NOTE — Patient Instructions (Signed)
[facility] at [facility] **VISIT SUMMARY & IMPORTANT INSTRUCTIONS **   You were seen today by Rojelio Brenner PA-C for your low platelets.    LOW PLATELETS Your low platelets may be caused by your enlarged spleen, or may be caused by your overactive immune system which can also attack your platelets. Your bone marrow biopsy and PET scan did not show any sign of blood cancer or lymphoma. Your enlarged spleen is most likely related to your autoimmune disease (rheumatoid arthritis). You do not need any treatment for your low platelets at this time.  We will continue to monitor your platelets closely at follow-up visits.  ABNORMAL FINDING ON PET SCAN: Your PET scan showed an area of abnormality in your colon. We do not yet know what caused this abnormality, but it is very important that we refer you for colonoscopy to make sure you do not have any signs of colon cancer. Please call [facility] at [phone] to reschedule your new patient appointment.  FOLLOW-UP APPOINTMENT: Office visit in 4 months to follow-up on platelets.  We will schedule you for an earlier visit if needed based on the results from your colonoscopy.  ** Thank you for trusting me with your healthcare!  I strive to provide all of my patients with quality care at each visit.  If you receive a survey for this visit, I would be so grateful to you for taking the time to provide feedback.  Thank you in advance!  ~ Gitel Beste                   Dr. Doreatha Massed   &   Rojelio Brenner, PA-C   - - - - - - - - - - - - - - - - - -    Thank you for choosing [facility] at [facility] to provide your oncology and hematology care.  To afford each patient quality time with our provider, please arrive at least 15 minutes before your scheduled appointment time.   If you have a lab appointment with the Cancer Center please come in thru the Main Entrance and  check in at the main information desk.  You need to re-schedule your appointment should you arrive 10 or more minutes late.  We strive to give you quality time with our providers, and arriving late affects you and other patients whose appointments are after yours.  Also, if you no show three or more times for appointments you may be dismissed from the clinic at the providers discretion.     Again, thank you for choosing [facility].  Our hope is that these requests will decrease the amount of time that you wait before being seen by our physicians.       _____________________________________________________________  Should you have questions after your visit to [facility], please contact our office at [phone] and follow the prompts.  Our office hours are 8:00 a.m. and 4:30 p.m. Monday - Friday.  Please note that voicemails left after 4:00 p.m. may not be returned until the following business day.  We are closed weekends and major holidays.  You do have access to a nurse 24-7, just call the main number to the clinic [phone] and do not press any options, hold on the line and a nurse will answer the phone.    For prescription refill requests, have your pharmacy contact our office and allow 72 hours.

## 2022-07-08 ENCOUNTER — Telehealth: Payer: Self-pay | Admitting: Vascular Surgery

## 2022-07-08 NOTE — Telephone Encounter (Signed)
Appt has been r/s

## 2022-07-15 ENCOUNTER — Ambulatory Visit: Payer: No Typology Code available for payment source | Admitting: Vascular Surgery

## 2022-07-15 ENCOUNTER — Ambulatory Visit (INDEPENDENT_AMBULATORY_CARE_PROVIDER_SITE_OTHER)
Admission: RE | Admit: 2022-07-15 | Discharge: 2022-07-15 | Disposition: A | Discharge status: Home / Self Care | Payer: No Typology Code available for payment source | Source: Ambulatory Visit | Attending: Vascular Surgery

## 2022-07-15 ENCOUNTER — Ambulatory Visit (HOSPITAL_COMMUNITY)
Admission: RE | Admit: 2022-07-15 | Discharge: 2022-07-15 | Disposition: A | Discharge status: Home / Self Care | Payer: No Typology Code available for payment source | Source: Ambulatory Visit | Attending: Vascular Surgery | Admitting: Vascular Surgery

## 2022-07-15 DIAGNOSIS — I7143 Infrarenal abdominal aortic aneurysm, without rupture: Secondary | ICD-10-CM | POA: Diagnosis not present

## 2022-07-15 DIAGNOSIS — I6522 Occlusion and stenosis of left carotid artery: Secondary | ICD-10-CM

## 2022-07-22 ENCOUNTER — Ambulatory Visit: Payer: No Typology Code available for payment source | Admitting: Vascular Surgery

## 2022-07-29 ENCOUNTER — Ambulatory Visit (INDEPENDENT_AMBULATORY_CARE_PROVIDER_SITE_OTHER): Payer: No Typology Code available for payment source | Admitting: Vascular Surgery

## 2022-07-29 ENCOUNTER — Encounter: Payer: Self-pay | Admitting: Vascular Surgery

## 2022-07-29 VITALS — BP 127/85 | HR 70 | Temp 98.1°F | Resp 18 | Ht 72.0 in | Wt 198.0 lb

## 2022-07-29 DIAGNOSIS — I7143 Infrarenal abdominal aortic aneurysm, without rupture: Secondary | ICD-10-CM | POA: Diagnosis not present

## 2022-07-29 DIAGNOSIS — I6523 Occlusion and stenosis of bilateral carotid arteries: Secondary | ICD-10-CM

## 2022-07-29 NOTE — Progress Notes (Signed)
Patient name: Austin Cox MRN: [id number] DOB: [birth year] Sex: male  REASON FOR VISIT: 6 month follow-up AAA  HPI: Austin Cox is a 77 y.o. male with history of coronary artery disease status post PCI, rheumatoid arthritis, carotid stenosis presents for 6 month follow-up of abdominal aortic aneurysm.  Patient got a ultrasound on 10/23/2021 showing a 5.1 cm abdominal aortic aneurysm ordered by Rojelio Brenner.  States he had no prior knowledge of the aneurysm.  Patient has chronic back pain but otherwise no new symptoms including abdominal pain.  Is well-known to our practice.  He had a left carotid endarterectomy on 04/15/2021 for an asymptomatic high-grade stenosis with string sign.  Postop course was complicated by a hematoma that was managed with observation.  He does not smoke and quit in the 1980's.  He reports no new issues on 44-month follow-up today.  No abdominal pain.  No recent stroke or TIA symptoms.  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 No pertinent family history.  SOCIAL HISTORY: Social History   Tobacco Use   Smoking status: Former    Packs/day: 0.20    Years: 10.00    Additional pack years: 0.00    Total pack years: 2.00    Types: Cigarettes    Quit date: 07/06/1974    Years since quitting: 48.0   Smokeless tobacco: Never  Substance Use Topics   Alcohol use: Not Currently    Alcohol/week: 1.0 standard drink of alcohol     Types: 1 Cans of beer per week    No Known Allergies  Current Outpatient Medications  Medication Sig Dispense Refill   acetaminophen (TYLENOL) 650 MG CR tablet Take 650 mg by mouth every 8 (eight) hours as needed for pain.     aspirin 81 MG EC tablet Take 81 mg by mouth daily.     baclofen (LIORESAL) 10 MG tablet Take 10 mg by mouth 2 (two) times daily.     Cholecalciferol 25 MCG (1000 UT) capsule Take 1 capsule (1,000 Units total) by mouth daily. 90 capsule 2   clotrimazole (CLOTRIMAZOLE ANTI-FUNGAL) 1 % cream Apply 1 Application topically 2 (two) times daily. 60 g 1   diclofenac Sodium (VOLTAREN) 1 % GEL Apply 1 application  topically 4 (four) times daily as needed (pain).     etanercept (ENBREL SURECLICK) 50 MG/ML injection Inject 50 mg into the skin every Friday.     folic acid (FOLVITE) 1 MG tablet Take 1 mg by mouth daily.     gabapentin (NEURONTIN) 300 MG capsule Take 300 mg by mouth 2 (two) times daily.     methotrexate (RHEUMATREX) 2.5 MG tablet Take 4 mg by mouth every Tuesday.     metoprolol succinate (TOPROL-XL) 25 MG 24 hr tablet TAKE 1 TABLET BY MOUTH ONCE  DAILY 100 tablet 2  nitroGLYCERIN (NITROSTAT) 0.3 MG SL tablet Place 0.3 mg under the tongue every 5 (five) minutes as needed.     rosuvastatin (CRESTOR) 40 MG tablet TAKE 1 TABLET BY MOUTH DAILY 100 tablet 0   Saw Palmetto 450-15 MG CAPS Take 2 tablets by mouth daily.     Tetrahydrozoline HCl (VISINE OP) Place 1 drop into both eyes daily as needed (dry eyes).     No current facility-administered medications for this visit.    REVIEW OF SYSTEMS:  [X]  denotes positive finding, [ ]  denotes negative finding Cardiac  Comments:  Chest pain or chest pressure:    Shortness of breath upon exertion:    Short of breath when lying flat:    Irregular heart rhythm:        Vascular    Pain in calf, thigh, or hip brought on by ambulation:    Pain in feet at night that wakes you up from your sleep:     Blood clot in your veins:     Leg swelling:         Pulmonary    Oxygen at home:    Productive cough:     Wheezing:         Neurologic    Sudden weakness in arms or legs:     Sudden numbness in arms or legs:     Sudden onset of difficulty speaking or slurred speech:    Temporary loss of vision in one eye:     Problems with dizziness:         Gastrointestinal    Blood in stool:     Vomited blood:         Genitourinary    Burning when urinating:     Blood in urine:        Psychiatric    Major depression:         Hematologic    Bleeding problems:    Problems with blood clotting too easily:        Skin    Rashes or ulcers:        Constitutional    Fever or chills:      PHYSICAL EXAM: Vitals:   07/29/22 1330 07/29/22 1332  BP: 129/87 127/85  Pulse: 70 70  Resp: 18   Temp: 98.1 F (36.7 C)   TempSrc: Temporal   SpO2: 95%   Weight: 198 lb (89.8 kg)   Height: 6' (1.829 m)     GENERAL: The patient is a well-nourished male, in no acute distress. The vital signs are documented above. CARDIAC: There is a regular rate and rhythm.  VASCULAR:  Left neck incision well-healed from previous carotid endarterectomy PULMONARY: No respiratory distress. ABDOMEN: Soft and non-tender.  No pain with deep palpation. MUSCULOSKELETAL: There are no major deformities or cyanosis. NEUROLOGIC: No focal weakness or paresthesias are detected.  Cranial nerves II through XII grossly intact SKIN: There are no ulcers or rashes noted. PSYCHIATRIC: The patient has a normal affect.  DATA:   AAA duplex here on 07/15/2022 shows 4.7 cm abdominal aortic aneurysm  Carotid ultrasound 07/15/2022 with minimal 1 to 39% carotid stenosis bilaterally  US abdomen 10/23/2021:  5.1 cm AAA  Assessment/Plan:  77 year old male well-known to our practice that has previously undergone a left carotid endarterectomy on 04/15/2021 for an asymptomatic high-grade stenosis.  He was referred back for a new finding of a 5.1 cm abdominal aortic  aneurysm last year.  On 30-month follow-up the repeat AAA duplex here  in our office shows a 4.7 cm aneurysm.  I discussed with the family that although there is a slight discrepancy in the measurements between the studies it indicates the aneurysm has not shown any significant growth.  There is no indication for surgical intervention from our standpoint.  Discussed guidelines for repair of abdominal aortic aneurysm is greater than 5.5 cm.  I will follow-up with him again in 6 months with repeat AAA ultrasound.  As it relates to his carotid artery disease, he has minimal 1 to 39% stenosis bilaterally on duplex today.  His left carotid endarterectomy site remains patent.  He has had no neurologic events.  I think he can get another carotid duplex in 1 year.  Cephus Shelling, MD [facility] Office: [phone]

## 2022-07-30 ENCOUNTER — Other Ambulatory Visit: Payer: Self-pay | Admitting: Thoracic Surgery (Cardiothoracic Vascular Surgery)

## 2022-07-30 DIAGNOSIS — I712 Thoracic aortic aneurysm, without rupture, unspecified: Secondary | ICD-10-CM

## 2022-08-07 ENCOUNTER — Other Ambulatory Visit: Payer: Self-pay

## 2022-08-07 DIAGNOSIS — I7143 Infrarenal abdominal aortic aneurysm, without rupture: Secondary | ICD-10-CM

## 2022-08-13 ENCOUNTER — Other Ambulatory Visit: Payer: Self-pay | Admitting: Family Medicine

## 2022-08-14 ENCOUNTER — Ambulatory Visit: Payer: No Typology Code available for payment source | Admitting: Family Medicine

## 2022-08-14 ENCOUNTER — Encounter: Payer: Self-pay | Admitting: Family Medicine

## 2022-08-14 VITALS — BP 125/80 | HR 65 | Temp 97.9°F | Ht 73.0 in | Wt 198.8 lb

## 2022-08-14 DIAGNOSIS — L2489 Irritant contact dermatitis due to other agents: Secondary | ICD-10-CM

## 2022-08-14 DIAGNOSIS — H9193 Unspecified hearing loss, bilateral: Secondary | ICD-10-CM

## 2022-08-14 MED ORDER — TRIAMCINOLONE ACETONIDE 0.1 % EX CREA
1.0000 | TOPICAL_CREAM | Freq: Two times a day (BID) | CUTANEOUS | 0 refills | Status: DC
Start: 2022-08-14 — End: 2023-01-13

## 2022-08-14 NOTE — Progress Notes (Signed)
Subjective:  Patient ID: Austin Cox, male    DOB: January 12, 1946, 77 y.o.   MRN: [id number]  Patient Care Team: Sonny Masters, FNP as PCP - General (Family Medicine) Doreatha Massed, MD as Medical Oncologist (Hematology)   Chief Complaint:  Referral (For hearing aids ) and Rash (Rash on neck x 1 day )   HPI: Austin Cox is a 77 y.o. male presenting on 08/14/2022 for Referral (For hearing aids ) and Rash (Rash on neck x 1 day )   Rash This is a new problem. The current episode started in the past 7 days. The problem is unchanged. The affected locations include the neck. The rash is characterized by redness and itchiness. Associated with: dry shaving. Pertinent negatives include no anorexia, congestion, cough, diarrhea, eye pain, facial edema, fatigue, fever, joint pain, nail changes, rhinorrhea, shortness of breath, sore throat or vomiting. Past treatments include antibiotic cream. The treatment provided no relief.   1. Hearing decreased, bilateral Was referred to audiology but wife states they did not receive an appointment. Would like new referral.     Relevant past medical, surgical, family, and social history reviewed and updated as indicated.  Allergies and medications reviewed and updated. Data reviewed: Chart in Epic.   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    Past Surgical History:  Procedure Laterality Date   CATARACT EXTRACTION     ENDARTERECTOMY Left 04/15/2021   Procedure: LEFT CAROTID ENDARTERECTOMY WITH;  Surgeon: Cephus Shelling, MD;  Location: [facility] OR;  Service: Vascular;  Laterality: Left;   EYE SURGERY     detached retina right eye   FRACTURE SURGERY     right leg   Stent in heart      Social History   Socioeconomic History   Marital status: Married    Spouse name: Not  on file   Number of children: 3   Years of education: Not on file   Highest education level: Not on file  Occupational History   Occupation: retired  Tobacco Use   Smoking status: Former    Packs/day: 0.20    Years: 10.00    Additional pack years: 0.00    Total pack years: 2.00    Types: Cigarettes    Quit date: 07/06/1974    Years since quitting: 48.1   Smokeless tobacco: Never  Vaping Use   Vaping Use: Never used  Substance and Sexual Activity   Alcohol use: Not Currently    Alcohol/week: 1.0 standard drink of alcohol    Types: 1 Cans of beer per week   Drug use: Never   Sexual activity: Not on file  Other Topics Concern   Not on file  Social History Narrative   3 children - one passed away   Son in [place]   They raised 2 teenage grandchildren after their daughter and son in law both passed away    Social Determinants of Health   Financial Resource Strain: High Risk (04/08/2022)   Overall Financial Resource Strain (CARDIA)    Difficulty of Paying Living Expenses: Hard  Food Insecurity: No Food Insecurity (04/08/2022)   Hunger Vital Sign    Worried About Running Out of Food in the Last Year: Never true    Ran  Out of Food in the Last Year: Never true  Transportation Needs: No Transportation Needs (04/08/2022)   PRAPARE - Administrator, Civil Service (Medical): No    Lack of Transportation (Non-Medical): No  Physical Activity: Inactive (04/08/2022)   Exercise Vital Sign    Days of Exercise per Week: 0 days    Minutes of Exercise per Session: 0 min  Stress: No Stress Concern Present (04/08/2022)   [employer] of Occupational Health - Occupational Stress Questionnaire    Feeling of Stress : Not at all  Social Connections: Moderately Isolated (04/08/2022)   Social Connection and Isolation Panel [NHANES]    Frequency of Communication with Friends and Family: More than three times a week    Frequency of Social Gatherings with Friends and Family: More  than three times a week    Attends Religious Services: Never    Database administrator or Organizations: No    Attends Banker Meetings: Never    Marital Status: Married  Catering manager Violence: Not At Risk (04/08/2022)   Humiliation, Afraid, Rape, and Kick questionnaire    Fear of Current or Ex-Partner: No    Emotionally Abused: No    Physically Abused: No    Sexually Abused: No    Outpatient Encounter Medications as of 08/14/2022  Medication Sig   acetaminophen (TYLENOL) 650 MG CR tablet Take 650 mg by mouth every 8 (eight) hours as needed for pain.   aspirin 81 MG EC tablet Take 81 mg by mouth daily.   baclofen (LIORESAL) 10 MG tablet Take 10 mg by mouth 2 (two) times daily.   Cholecalciferol 25 MCG (1000 UT) capsule Take 1 capsule (1,000 Units total) by mouth daily.   clotrimazole (CLOTRIMAZOLE ANTI-FUNGAL) 1 % cream Apply 1 Application topically 2 (two) times daily.   diclofenac Sodium (VOLTAREN) 1 % GEL PLACE ONTO THE SKIN 4 (FOUR) TIMES A DAY AS NEEDED FOR UP TO 30 DAYS.   etanercept (ENBREL SURECLICK) 50 MG/ML injection Inject 50 mg into the skin every Friday.   folic acid (FOLVITE) 1 MG tablet Take 1 mg by mouth daily.   gabapentin (NEURONTIN) 300 MG capsule Take 300 mg by mouth 2 (two) times daily.   methotrexate (RHEUMATREX) 2.5 MG tablet Take 4 mg by mouth every Tuesday.   metoprolol succinate (TOPROL-XL) 25 MG 24 hr tablet TAKE 1 TABLET BY MOUTH ONCE  DAILY   nitroGLYCERIN (NITROSTAT) 0.3 MG SL tablet Place 0.3 mg under the tongue every 5 (five) minutes as needed.   rosuvastatin (CRESTOR) 40 MG tablet TAKE 1 TABLET BY MOUTH DAILY   Saw Palmetto 450-15 MG CAPS Take 2 tablets by mouth daily.   Tetrahydrozoline HCl (VISINE OP) Place 1 drop into both eyes daily as needed (dry eyes).   triamcinolone cream (KENALOG) 0.1 % Apply 1 Application topically 2 (two) times daily.   No facility-administered encounter medications on file as of 08/14/2022.    No Known  Allergies  Review of Systems  Constitutional:  Negative for activity change, appetite change, chills, fatigue and fever.  HENT:  Positive for hearing loss. Negative for congestion, rhinorrhea and sore throat.   Eyes: Negative.  Negative for pain.  Respiratory:  Negative for cough, chest tightness and shortness of breath.   Cardiovascular:  Negative for chest pain, palpitations and leg swelling.  Gastrointestinal:  Negative for anorexia, blood in stool, constipation, diarrhea, nausea and vomiting.  Endocrine: Negative.   Genitourinary:  Negative for dysuria, frequency and  urgency.  Musculoskeletal:  Negative for arthralgias, joint pain and myalgias.  Skin:  Positive for color change and rash. Negative for nail changes.  Allergic/Immunologic: Negative.   Neurological:  Negative for dizziness and headaches.  Hematological: Negative.   Psychiatric/Behavioral:  Negative for confusion, hallucinations, sleep disturbance and suicidal ideas.   All other systems reviewed and are negative.       Objective:  BP 125/80   Pulse 65   Temp 97.9 F (36.6 C) (Temporal)   Ht 6\' 1"  (1.854 m)   Wt 198 lb 12.8 oz (90.2 kg)   SpO2 96%   BMI 26.23 kg/m    Wt Readings from Last 3 Encounters:  08/14/22 198 lb 12.8 oz (90.2 kg)  07/29/22 198 lb (89.8 kg)  05/21/22 199 lb 9.6 oz (90.5 kg)    Physical Exam Vitals and nursing note reviewed.  Constitutional:      Appearance: Normal appearance. He is normal weight.  HENT:     Head: Normocephalic and atraumatic.     Right Ear: Decreased hearing noted.     Left Ear: Decreased hearing noted.     Mouth/Throat:     Mouth: Mucous membranes are moist.  Eyes:     Pupils: Pupils are equal, round, and reactive to light.  Cardiovascular:     Rate and Rhythm: Normal rate and regular rhythm.     Heart sounds: Normal heart sounds.  Pulmonary:     Effort: Pulmonary effort is normal.     Breath sounds: Normal breath sounds.  Musculoskeletal:     Right  lower leg: No edema.     Left lower leg: No edema.  Skin:    General: Skin is warm and dry.     Capillary Refill: Capillary refill takes less than 2 seconds.     Findings: Rash (red, raised rash to anterior neck) present.  Neurological:     General: No focal deficit present.     Mental Status: He is alert and oriented to person, place, and time.     Gait: Gait abnormal (slow, using walking stick).  Psychiatric:        Mood and Affect: Mood normal.        Behavior: Behavior normal.     Results for orders placed or performed in visit on 06/18/22  CBC with Differential  Result Value Ref Range   WBC 5.3 4.0 - 10.5 K/uL   RBC 4.42 4.22 - 5.81 MIL/uL   Hemoglobin 11.6 (L) 13.0 - 17.0 g/dL   HCT 40.9 (L) 81.1 - 91.4 %   MCV 82.6 80.0 - 100.0 fL   MCH 26.2 26.0 - 34.0 pg   MCHC 31.8 30.0 - 36.0 g/dL   RDW 78.2 (H) 95.6 - 21.3 %   Platelets 75 (L) 150 - 400 K/uL   nRBC 0.0 0.0 - 0.2 %   Neutrophils Relative % 38 %   Neutro Abs 2.0 1.7 - 7.7 K/uL   Lymphocytes Relative 18 %   Lymphs Abs 0.9 0.7 - 4.0 K/uL   Monocytes Relative 40 %   Monocytes Absolute 2.2 (H) 0.1 - 1.0 K/uL   Eosinophils Relative 2 %   Eosinophils Absolute 0.1 0.0 - 0.5 K/uL   Basophils Relative 0 %   Basophils Absolute 0.0 0.0 - 0.1 K/uL   WBC Morphology MORPHOLOGY UNREMARKABLE    Smear Review PLATELET COUNT CONFIRMED BY SMEAR    Immature Granulocytes 2 %   Abs Immature Granulocytes 0.08 (H) 0.00 - 0.07  K/uL   Polychromasia PRESENT   Lactate dehydrogenase  Result Value Ref Range   LDH 113 98 - 192 U/L       Pertinent labs & imaging results that were available during my care of the patient were reviewed by me and considered in my medical decision making.  Assessment & Plan:  Slayden was seen today for referral and rash.  Diagnoses and all orders for this visit:  Hearing decreased, bilateral New referral placed. -     Ambulatory referral to Audiology  Irritant contact dermatitis due to other  agents Aware to avoid dry shaving. Will treat with below. Report new, worsening, or persistent problems.  -     triamcinolone cream (KENALOG) 0.1 %; Apply 1 Application topically 2 (two) times daily.     Continue all other maintenance medications.  Follow up plan: Return if symptoms worsen or fail to improve.   Continue healthy lifestyle choices, including diet (rich in fruits, vegetables, and lean proteins, and low in salt and simple carbohydrates) and exercise (at least 30 minutes of moderate physical activity daily).  Educational handout given for contact dermatitis   The above assessment and management plan was discussed with the patient. The patient verbalized understanding of and has agreed to the management plan. Patient is aware to call the clinic if they develop any new symptoms or if symptoms persist or worsen. Patient is aware when to return to the clinic for a follow-up visit. Patient educated on when it is appropriate to go to the emergency department.   Kari Baars, FNP-C [facility] [phone]

## 2022-08-19 ENCOUNTER — Ambulatory Visit: Payer: No Typology Code available for payment source | Admitting: Thoracic Surgery (Cardiothoracic Vascular Surgery)

## 2022-08-19 ENCOUNTER — Inpatient Hospital Stay: Admission: RE | Admit: 2022-08-19 | Payer: No Typology Code available for payment source | Source: Ambulatory Visit

## 2022-09-12 ENCOUNTER — Other Ambulatory Visit: Payer: Self-pay | Admitting: Family Medicine

## 2022-09-13 ENCOUNTER — Other Ambulatory Visit: Payer: Self-pay | Admitting: Family Medicine

## 2022-09-13 DIAGNOSIS — Z20822 Contact with and (suspected) exposure to covid-19: Secondary | ICD-10-CM

## 2022-09-13 DIAGNOSIS — E7800 Pure hypercholesterolemia, unspecified: Secondary | ICD-10-CM

## 2022-09-15 ENCOUNTER — Other Ambulatory Visit: Payer: No Typology Code available for payment source

## 2022-09-23 ENCOUNTER — Ambulatory Visit: Payer: No Typology Code available for payment source | Admitting: Thoracic Surgery (Cardiothoracic Vascular Surgery)

## 2022-09-24 ENCOUNTER — Other Ambulatory Visit: Payer: No Typology Code available for payment source

## 2022-09-24 DIAGNOSIS — M0579 Rheumatoid arthritis with rheumatoid factor of multiple sites without organ or systems involvement: Secondary | ICD-10-CM | POA: Diagnosis not present

## 2022-09-24 DIAGNOSIS — Z79899 Other long term (current) drug therapy: Secondary | ICD-10-CM | POA: Diagnosis not present

## 2022-10-03 ENCOUNTER — Ambulatory Visit
Admission: RE | Admit: 2022-10-03 | Discharge: 2022-10-03 | Disposition: A | Discharge status: Home / Self Care | Payer: No Typology Code available for payment source | Source: Ambulatory Visit | Attending: Thoracic Surgery (Cardiothoracic Vascular Surgery) | Admitting: Thoracic Surgery (Cardiothoracic Vascular Surgery)

## 2022-10-03 DIAGNOSIS — I712 Thoracic aortic aneurysm, without rupture, unspecified: Secondary | ICD-10-CM | POA: Diagnosis not present

## 2022-10-03 MED ORDER — IOPAMIDOL (ISOVUE-370) INJECTION 76%
75.0000 mL | Freq: Once | INTRAVENOUS | Status: AC | PRN
  Administered 2022-10-03: 75 mL via INTRAVENOUS

## 2022-10-10 ENCOUNTER — Other Ambulatory Visit: Payer: Self-pay | Admitting: Family Medicine

## 2022-10-10 DIAGNOSIS — L2489 Irritant contact dermatitis due to other agents: Secondary | ICD-10-CM

## 2022-10-10 DIAGNOSIS — L89311 Pressure ulcer of right buttock, stage 1: Secondary | ICD-10-CM

## 2022-10-10 DIAGNOSIS — B372 Candidiasis of skin and nail: Secondary | ICD-10-CM

## 2022-10-10 DIAGNOSIS — E7800 Pure hypercholesterolemia, unspecified: Secondary | ICD-10-CM

## 2022-10-10 NOTE — Telephone Encounter (Signed)
Rakes pt NTBS 30-d given 09/15/22

## 2022-10-11 ENCOUNTER — Other Ambulatory Visit: Payer: Self-pay | Admitting: Family Medicine

## 2022-10-13 ENCOUNTER — Encounter: Payer: Self-pay | Admitting: Family Medicine

## 2022-10-13 NOTE — Telephone Encounter (Signed)
LMTCB to schedule appt Letter mailed 

## 2022-10-21 ENCOUNTER — Ambulatory Visit: Payer: No Typology Code available for payment source | Admitting: Thoracic Surgery (Cardiothoracic Vascular Surgery)

## 2022-10-21 DIAGNOSIS — I7121 Aneurysm of the ascending aorta, without rupture: Secondary | ICD-10-CM

## 2022-10-21 DIAGNOSIS — I7123 Aneurysm of the descending thoracic aorta, without rupture: Secondary | ICD-10-CM | POA: Diagnosis not present

## 2022-10-21 NOTE — Progress Notes (Signed)
[address]       [name] [address]             [phone]      This visit was conducted via telephone at the patient's request due to difficulty traveling to the office.  Patient location: Home MD location: Office  HPI: Austin Cox has a CT for follow-up of ascending and descending thoracic aortic aneurysms.  Austin Cox is a 77 year old man with a history of CAD, coronary stent, carotid stenosis, carotid endarterectomy, hypertension, hyperlipidemia, abdominal aortic aneurysm, thoracic aortic atherosclerosis, ascending and descending thoracic aneurysms, chronic pain, gait instability, severe rheumatoid arthritis, degenerative disc disease, reflux, vitamin D deficiency, and thrombocytopenia.  He had a PET/CT for evaluation of thrombocytopenia.  He was noted to have mild splenomegaly.  He also was noted to have a 6.2 cm ascending aneurysm extending through the arch with diffuse severe thoracic aortic atherosclerotic disease.  Also him in November 2023.  He was not a good candidate for surgery but I saw him back again in January for further consideration.  Due to his very limited physical activity he was not felt to be a candidate for surgery with the risk of surgery being prohibitive.    In the interim since that visit he was hospitalized with a respiratory infection shortly after I saw him.  He had a CT angiogram which showed the aneurysms in more detail.  He saw Dr. Chestine Spore in May.  There was no indication for intervention on his abdominal aneurysm.  He just got a new hearing aid and was having difficulty hearing me on the telephone so his wife was present on the call as well.  He is not having any chest pain, pressure, or tightness.  He is able to walk with a walker but does not do much.  He does ride his lawnmower occasionally.  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     aspirin 81 MG EC tablet Take 81 mg by mouth daily.     baclofen (LIORESAL) 10 MG tablet Take 10 mg by mouth 2 (two) times daily.     Cholecalciferol 25 MCG (1000 UT) capsule Take 1 capsule (1,000 Units total) by mouth daily. 90 capsule 2   clotrimazole (CLOTRIMAZOLE ANTI-FUNGAL) 1 % cream Apply 1 Application topically 2 (two) times daily. 60 g 1   diclofenac Sodium (VOLTAREN) 1 % GEL PLACE ONTO THE SKIN 4 (FOUR) TIMES A DAY AS NEEDED FOR UP TO 30 DAYS. 100 g 0   etanercept (ENBREL SURECLICK) 50 MG/ML injection Inject 50 mg into the skin every Friday.     folic acid (FOLVITE) 1 MG tablet Take 1 mg by mouth daily.     gabapentin (NEURONTIN) 300 MG capsule Take 300 mg by mouth 2 (two) times daily.     methotrexate (RHEUMATREX) 2.5 MG tablet Take 4 mg by mouth every Tuesday.     metoprolol succinate (TOPROL-XL) 25 MG 24 hr tablet TAKE 1 TABLET BY MOUTH ONCE  DAILY 100 tablet 2   nitroGLYCERIN (NITROSTAT) 0.3 MG SL tablet  Place 0.3 mg under the tongue every 5 (five) minutes as needed.     rosuvastatin (CRESTOR) 40 MG tablet Take 1 tablet (40 mg total) by mouth daily. (NEEDS TO BE SEEN BEFORE NEXT REFILL) 30 tablet 0   Saw Palmetto 450-15 MG CAPS Take 2 tablets by mouth daily.     Tetrahydrozoline HCl (VISINE OP) Place 1 drop into both eyes daily as needed (dry eyes).     triamcinolone cream (KENALOG) 0.1 % Apply 1 Application topically 2 (two) times daily. 30 g 0   No current facility-administered medications for this visit.    Physical Exam None  Diagnostic Tests: CT ANGIOGRAPHY CHEST WITH CONTRAST   TECHNIQUE: Multidetector CT imaging of the chest was performed using the standard protocol during bolus administration of  intravenous contrast. Multiplanar CT image reconstructions and MIPs were obtained to evaluate the vascular anatomy.   RADIATION DOSE REDUCTION: This exam was performed according to the departmental dose-optimization program which includes automated exposure control, adjustment of the mA and/or kV according to patient size and/or use of iterative reconstruction technique.   CONTRAST:  75mL ISOVUE-370 IOPAMIDOL (ISOVUE-370) INJECTION 76%   COMPARISON:  March 30, 2022.   FINDINGS: Cardiovascular: Diffuse aneurysmal dilatation of thoracic aorta is noted without definite evidence of dissection. Ascending thoracic aorta measures 6.4 cm in diameter. Proximal descending thoracic aorta measures 5.5 cm in diameter. Distal descending thoracic aorta measures 7.4 cm which is not significantly changed compared to prior exam based on my own measurement, best seen on image number 85 of series 7. Great vessels are widely patent, although severe stenosis is noted at origin of left common carotid artery. No pericardial effusion is noted. Normal cardiac size.   Mediastinum/Nodes: No enlarged mediastinal, hilar, or axillary lymph nodes. Thyroid gland, trachea, and esophagus demonstrate no significant findings.   Lungs/Pleura: No pneumothorax or pleural effusion is noted. No acute pulmonary disease is noted. Stable nodular density is noted along right major fissure in right lower lobe best seen on image number 69 of series 11. Stable nodule is noted in right lower lobe best seen on image number 112 of series 11.   Upper Abdomen: No acute abnormality.   Musculoskeletal: No chest wall abnormality. No acute or significant osseous findings.   Review of the MIP images confirms the above findings.   IMPRESSION: Stable diffuse aneurysmal dilatation of thoracic aorta is noted without definite evidence of dissection. Ascending thoracic aorta measures 6.4 cm in maximum diameter which is unchanged.  Proximal descending thoracic aorta measures 5.5 cm in diameter. Distal descending thoracic aorta measures 7.4 cm just above the aortic hiatus which is not significantly changed compared to prior exam based on my own measurements. Recommend semi-annual imaging followup by CTA or MRA and referral to cardiothoracic surgery if not already obtained. This recommendation follows 2010 ACCF/AHA/AATS/ACR/ASA/SCA/SCAI/SIR/STS/SVM Guidelines for the Diagnosis and Management of Patients With Thoracic Aortic Disease. Circulation. 2010; 121: [id number]. Aortic aneurysm NOS (ICD10-I71.9).   Stable nodular densities are noted in right lung which are unchanged compared to prior exam.   Severe stenosis is noted at origin of left common carotid artery.   Aortic Atherosclerosis (ICD10-I70.0).     Electronically Signed   By: Lupita Raider M.D.   On: 10/03/2022 13:08 I personally reviewed the CT images.  There is a 6.4 cm ascending aneurysm in the setting of generalized aortomegaly.  7.4 cm descending aneurysm.  Severe aortic atherosclerotic disease.  Stenosis of the left common carotid artery, severe coronary atherosclerosis.  Impression: Austin Cox is a 77 year old man with a history of CAD, coronary stent, carotid stenosis, carotid endarterectomy, hypertension, hyperlipidemia, abdominal aortic aneurysm, thoracic aortic atherosclerosis, ascending and descending thoracic aneurysms, chronic pain, gait instability, severe rheumatoid arthritis, degenerative disc disease, reflux, vitamin D deficiency, and thrombocytopenia.  Thoracic aortic atherosclerosis with ascending, arch, and descending aneurysms and generalized aortomegaly.  Ascending and descending aneurysms both meet size criteria for surgery.  However his risk is prohibitive due to his comorbidities.  I did offer them the opportunity to come to the office for an in person visit if he felt like he wanted to pursue surgery.  He and his wife both  say that they are not interested but do want to continue to follow the aneurysms.  Plan: Repeat CT angiogram of chest, abdomen and pelvis in 6 months and return to office to further discuss results.  I spent 15 minutes today in review of records, images, and in consultation by telephone with Mr. Jonah Blue, MD [facility] [phone]

## 2022-10-21 NOTE — Progress Notes (Signed)
      [address]       [place] [address]             [phone]       

## 2022-10-22 ENCOUNTER — Ambulatory Visit: Payer: No Typology Code available for payment source | Admitting: Family Medicine

## 2022-10-22 ENCOUNTER — Encounter: Payer: Self-pay | Admitting: Family Medicine

## 2022-10-22 VITALS — BP 113/76 | HR 80 | Temp 97.9°F | Ht 73.0 in | Wt 202.2 lb

## 2022-10-22 DIAGNOSIS — E7800 Pure hypercholesterolemia, unspecified: Secondary | ICD-10-CM

## 2022-10-22 DIAGNOSIS — I2510 Atherosclerotic heart disease of native coronary artery without angina pectoris: Secondary | ICD-10-CM

## 2022-10-22 DIAGNOSIS — M0579 Rheumatoid arthritis with rheumatoid factor of multiple sites without organ or systems involvement: Secondary | ICD-10-CM

## 2022-10-22 DIAGNOSIS — B351 Tinea unguium: Secondary | ICD-10-CM

## 2022-10-22 DIAGNOSIS — I10 Essential (primary) hypertension: Secondary | ICD-10-CM | POA: Diagnosis not present

## 2022-10-22 DIAGNOSIS — E559 Vitamin D deficiency, unspecified: Secondary | ICD-10-CM

## 2022-10-22 DIAGNOSIS — L84 Corns and callosities: Secondary | ICD-10-CM

## 2022-10-22 DIAGNOSIS — R739 Hyperglycemia, unspecified: Secondary | ICD-10-CM | POA: Diagnosis not present

## 2022-10-22 MED ORDER — ROSUVASTATIN CALCIUM 40 MG PO TABS
40.0000 mg | ORAL_TABLET | Freq: Every day | ORAL | 1 refills | Status: DC
Start: 2022-10-22 — End: 2023-04-13

## 2022-10-22 MED ORDER — NITROGLYCERIN 0.3 MG SL SUBL
0.3000 mg | SUBLINGUAL_TABLET | SUBLINGUAL | 0 refills | Status: AC | PRN
Start: 2022-10-22 — End: ?

## 2022-10-22 MED ORDER — METOPROLOL SUCCINATE ER 25 MG PO TB24
25.0000 mg | ORAL_TABLET | Freq: Every day | ORAL | 2 refills | Status: DC
Start: 2022-10-22 — End: 2023-04-24

## 2022-10-22 NOTE — Progress Notes (Signed)
Subjective:  Patient ID: Austin Cox, male    DOB: 1946/01/26, 77 y.o.   MRN: [id number]  Patient Care Team: Sonny Masters, FNP as PCP - General (Family Medicine) Doreatha Massed, MD as Medical Oncologist (Hematology)   Chief Complaint:  Medical Management of Chronic Issues   HPI: Austin Cox is a 77 y.o. male presenting on 10/22/2022 for Medical Management of Chronic Issues   1. Pure hypercholesterolemia Compliant with medications - Yes Current medications - rosuvastatin Side effects from medications - No Diet - general Exercise - not regular   2. Essential (primary) hypertension Complaint with meds - Yes Current Medications - toprol Checking BP at home - no Exercising Regularly - No Watching Salt intake - Yes Pertinent ROS:  Headache - No Fatigue - No Visual Disturbances - No Chest pain - No Dyspnea - No Palpitations - No LE edema - No They report good compliance with medications and can restate their regimen by memory. No medication side effects.  BP Readings from Last 3 Encounters:  10/22/22 113/76  08/14/22 125/80  07/29/22 127/85     3. Rheumatoid arthritis involving multiple sites with positive rheumatoid factor (HCC) Followed by rheumatology on a regular basis. On methotrexate and gabapentin. Tolerating well.   4. Coronary artery disease involving native coronary artery of native heart without angina pectoris No anginal symptoms. SL NTG has expired and would like this refilled. Has seen vascular for aortic aneurysms. Not a surgical candidate per vascular notes.   5. Foot problem Reports thickening and yellowing of toenails. Also has a hard lesion on the lateral aspect of his foot that can be bothersome at times.   6. Vitamin D deficiency Pt is taking oral repletion therapy. Denies bone pain and tenderness, muscle weakness, fracture, and difficulty walking. Lab Results  Component Value Date   VD25OH 50.9 09/18/2021   VD25OH 45.7  03/19/2021   Lab Results  Component Value Date   CALCIUM 8.6 04/09/2022       Relevant past medical, surgical, family, and social history reviewed and updated as indicated.  Allergies and medications reviewed and updated. Data reviewed: Chart in Epic.   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    Past Surgical History:  Procedure Laterality Date   CATARACT EXTRACTION     ENDARTERECTOMY Left 04/15/2021   Procedure: LEFT CAROTID ENDARTERECTOMY WITH;  Surgeon: Cephus Shelling, MD;  Location: [facility] OR;  Service: Vascular;  Laterality: Left;   EYE SURGERY     detached retina right eye   FRACTURE SURGERY     right leg   Stent in heart      Social History   Socioeconomic History   Marital status: Married    Spouse name: Not on file   Number of children: 3   Years of education: Not on file   Highest education level: Not on file  Occupational History   Occupation: retired  Tobacco Use   Smoking status: Former    Current packs/day: 0.00    Average packs/day: 0.2 packs/day for 10.0 years (2.0 ttl pk-yrs)    Types: Cigarettes    Start date: 07/05/1964    Quit date: 07/06/1974    Years since quitting: 48.3   Smokeless tobacco: Never  Vaping  Use   Vaping status: Never Used  Substance and Sexual Activity   Alcohol use: Not Currently    Alcohol/week: 1.0 standard drink of alcohol    Types: 1 Cans of beer per week   Drug use: Never   Sexual activity: Not on file  Other Topics Concern   Not on file  Social History Narrative   3 children - one passed away   Son in [place]   They raised 2 teenage grandchildren after their daughter and son in law both passed away    Social Determinants of Health   Financial Resource Strain: Low Risk  (05/22/2022)   Received from [facility], [facility]   Overall  Financial Resource Strain (CARDIA)    Difficulty of Paying Living Expenses: Not hard at all  Recent Concern: Financial Resource Strain - High Risk (04/08/2022)   Overall Financial Resource Strain (CARDIA)    Difficulty of Paying Living Expenses: Hard  Food Insecurity: No Food Insecurity (05/22/2022)   Received from [facility]   Hunger Vital Sign    Worried About Running Out of Food in the Last Year: Never true    Ran Out of Food in the Last Year: Never true  Transportation Needs: No Transportation Needs (05/22/2022)   Received from [facility], [facility]   PRAPARE - Transportation    Lack of Transportation (Medical): No    Lack of Transportation (Non-Medical): No  Physical Activity: Inactive (04/08/2022)   Exercise Vital Sign    Days of Exercise per Week: 0 days    Minutes of Exercise per Session: 0 min  Stress: No Stress Concern Present (04/08/2022)   [employer] of Occupational Health - Occupational Stress Questionnaire    Feeling of Stress : Not at all  Social Connections: Moderately Isolated (04/08/2022)   Social Connection and Isolation Panel [NHANES]    Frequency of Communication with Friends and Family: More than three times a week    Frequency of Social Gatherings with Friends and Family: More than three times a week    Attends Religious Services: Never    Database administrator or Organizations: No    Attends Banker Meetings: Never    Marital Status: Married  Catering manager Violence: Not At Risk (04/08/2022)   Humiliation, Afraid, Rape, and Kick questionnaire    Fear of Current or Ex-Partner: No    Emotionally Abused: No    Physically Abused: No    Sexually Abused: No    Outpatient Encounter Medications as of 10/22/2022  Medication Sig   acetaminophen (TYLENOL) 650 MG CR tablet Take 650 mg by mouth every 8 (eight) hours as needed for pain.   aspirin 81 MG EC tablet Take 81 mg by mouth daily.   baclofen (LIORESAL) 10 MG tablet Take  10 mg by mouth 2 (two) times daily.   Cholecalciferol 25 MCG (1000 UT) capsule Take 1 capsule (1,000 Units total) by mouth daily.   clotrimazole (CLOTRIMAZOLE ANTI-FUNGAL) 1 % cream Apply 1 Application topically 2 (two) times daily.   diclofenac Sodium (VOLTAREN) 1 % GEL PLACE ONTO THE SKIN 4 (FOUR) TIMES A DAY AS NEEDED FOR UP TO 30 DAYS.   etanercept (ENBREL SURECLICK) 50 MG/ML injection Inject 50 mg into the skin every Friday.   folic acid (FOLVITE) 1 MG tablet Take 1 mg by mouth daily.   gabapentin (NEURONTIN) 300 MG capsule Take 300 mg by mouth 2 (two) times daily.   methotrexate (RHEUMATREX) 2.5 MG tablet Take  4 mg by mouth every Tuesday.   Saw Palmetto 450-15 MG CAPS Take 2 tablets by mouth daily.   Tetrahydrozoline HCl (VISINE OP) Place 1 drop into both eyes daily as needed (dry eyes).   triamcinolone cream (KENALOG) 0.1 % Apply 1 Application topically 2 (two) times daily.   [DISCONTINUED] metoprolol succinate (TOPROL-XL) 25 MG 24 hr tablet TAKE 1 TABLET BY MOUTH ONCE  DAILY   [DISCONTINUED] nitroGLYCERIN (NITROSTAT) 0.3 MG SL tablet Place 0.3 mg under the tongue every 5 (five) minutes as needed.   [DISCONTINUED] rosuvastatin (CRESTOR) 40 MG tablet Take 1 tablet (40 mg total) by mouth daily. (NEEDS TO BE SEEN BEFORE NEXT REFILL)   metoprolol succinate (TOPROL-XL) 25 MG 24 hr tablet Take 1 tablet (25 mg total) by mouth daily.   nitroGLYCERIN (NITROSTAT) 0.3 MG SL tablet Place 1 tablet (0.3 mg total) under the tongue every 5 (five) minutes as needed.   rosuvastatin (CRESTOR) 40 MG tablet Take 1 tablet (40 mg total) by mouth daily.   No facility-administered encounter medications on file as of 10/22/2022.    No Known Allergies  Review of Systems  Constitutional:  Negative for activity change, appetite change, chills, diaphoresis, fatigue, fever and unexpected weight change.  HENT: Negative.    Eyes: Negative.  Negative for photophobia and visual disturbance.  Respiratory:  Negative for  cough, chest tightness and shortness of breath.   Cardiovascular:  Negative for chest pain, palpitations and leg swelling.  Gastrointestinal:  Negative for abdominal pain, blood in stool, constipation, diarrhea, nausea and vomiting.  Endocrine: Negative.  Negative for polydipsia, polyphagia and polyuria.  Genitourinary:  Negative for decreased urine volume, difficulty urinating, dysuria, frequency and urgency.  Musculoskeletal:  Positive for arthralgias, back pain and gait problem. Negative for joint swelling, myalgias, neck pain and neck stiffness.  Skin:  Positive for color change and wound.  Allergic/Immunologic: Negative.   Neurological:  Negative for dizziness, tremors, seizures, syncope, facial asymmetry, speech difficulty, weakness, light-headedness, numbness and headaches.  Hematological: Negative.   Psychiatric/Behavioral:  Negative for confusion, hallucinations, sleep disturbance and suicidal ideas.   All other systems reviewed and are negative.       Objective:  BP 113/76   Pulse 80   Temp 97.9 F (36.6 C) (Temporal)   Ht 6\' 1"  (1.854 m)   Wt 202 lb 3.2 oz (91.7 kg)   SpO2 93%   BMI 26.68 kg/m    Wt Readings from Last 3 Encounters:  10/22/22 202 lb 3.2 oz (91.7 kg)  08/14/22 198 lb 12.8 oz (90.2 kg)  07/29/22 198 lb (89.8 kg)    Physical Exam Vitals and nursing note reviewed.  Constitutional:      General: He is not in acute distress.    Appearance: Normal appearance. He is well-developed and well-groomed. He is not ill-appearing, toxic-appearing or diaphoretic.  HENT:     Head: Normocephalic and atraumatic.     Jaw: There is normal jaw occlusion.     Right Ear: Hearing normal.     Left Ear: Hearing normal.     Nose: Nose normal.     Mouth/Throat:     Lips: Pink.     Mouth: Mucous membranes are moist.     Pharynx: Oropharynx is clear. Uvula midline.  Eyes:     General: Lids are normal.     Extraocular Movements: Extraocular movements intact.      Conjunctiva/sclera: Conjunctivae normal.     Pupils: Pupils are equal, round, and reactive to light.  Neck:     Thyroid: No thyroid mass, thyromegaly or thyroid tenderness.     Vascular: Carotid bruit present. No JVD.     Trachea: Trachea and phonation normal.  Cardiovascular:     Rate and Rhythm: Normal rate and regular rhythm.     Chest Wall: PMI is not displaced.     Pulses: Normal pulses.     Heart sounds: Normal heart sounds. No murmur heard.    No friction rub. No gallop.     Comments: Bounding apical pulse Pulmonary:     Effort: Pulmonary effort is normal. No respiratory distress.     Breath sounds: Normal breath sounds. No wheezing.  Abdominal:     General: Bowel sounds are normal. There is no distension or abdominal bruit.     Palpations: Abdomen is soft. There is no hepatomegaly or splenomegaly.     Tenderness: There is no abdominal tenderness. There is no right CVA tenderness or left CVA tenderness.     Hernia: No hernia is present.  Musculoskeletal:        General: Normal range of motion.     Cervical back: Normal range of motion and neck supple.     Right lower leg: No edema.     Left lower leg: No edema.       Feet:  Feet:     Right foot:     Skin integrity: Callus present.     Toenail Condition: Right toenails are abnormally thick. Fungal disease present. Lymphadenopathy:     Cervical: No cervical adenopathy.  Skin:    General: Skin is warm and dry.     Capillary Refill: Capillary refill takes less than 2 seconds.     Coloration: Skin is not cyanotic, jaundiced or pale.     Findings: No rash.  Neurological:     General: No focal deficit present.     Mental Status: He is alert and oriented to person, place, and time.     Sensory: Sensation is intact.     Motor: Motor function is intact.     Coordination: Coordination is intact.     Gait: Gait is intact.     Deep Tendon Reflexes: Reflexes are normal and symmetric.  Psychiatric:        Attention and  Perception: Attention and perception normal.        Mood and Affect: Mood and affect normal.        Speech: Speech normal.        Behavior: Behavior normal. Behavior is cooperative.        Thought Content: Thought content normal.        Cognition and Memory: Cognition and memory normal.        Judgment: Judgment normal.     Results for orders placed or performed in visit on 06/18/22  CBC with Differential  Result Value Ref Range   WBC 5.3 4.0 - 10.5 K/uL   RBC 4.42 4.22 - 5.81 MIL/uL   Hemoglobin 11.6 (L) 13.0 - 17.0 g/dL   HCT 84.6 (L) 96.2 - 95.2 %   MCV 82.6 80.0 - 100.0 fL   MCH 26.2 26.0 - 34.0 pg   MCHC 31.8 30.0 - 36.0 g/dL   RDW 84.1 (H) 32.4 - 40.1 %   Platelets 75 (L) 150 - 400 K/uL   nRBC 0.0 0.0 - 0.2 %   Neutrophils Relative % 38 %   Neutro Abs 2.0 1.7 - 7.7 K/uL   Lymphocytes Relative 18 %  Lymphs Abs 0.9 0.7 - 4.0 K/uL   Monocytes Relative 40 %   Monocytes Absolute 2.2 (H) 0.1 - 1.0 K/uL   Eosinophils Relative 2 %   Eosinophils Absolute 0.1 0.0 - 0.5 K/uL   Basophils Relative 0 %   Basophils Absolute 0.0 0.0 - 0.1 K/uL   WBC Morphology MORPHOLOGY UNREMARKABLE    Smear Review PLATELET COUNT CONFIRMED BY SMEAR    Immature Granulocytes 2 %   Abs Immature Granulocytes 0.08 (H) 0.00 - 0.07 K/uL   Polychromasia PRESENT   Lactate dehydrogenase  Result Value Ref Range   LDH 113 98 - 192 U/L       Pertinent labs & imaging results that were available during my care of the patient were reviewed by me and considered in my medical decision making.  Assessment & Plan:  Waverly was seen today for medical management of chronic issues.  Diagnoses and all orders for this visit:  Essential (primary) hypertension BP well controlled. Changes were not made in regimen today. Goal BP is 130/80. Pt aware to report any persistent high or low readings. DASH diet and exercise encouraged. Exercise at least 150 minutes per week and increase as tolerated. Goal BMI > 25. Stress  management encouraged. Avoid nicotine and tobacco product use. Avoid excessive alcohol and NSAID's. Avoid more than 2000 mg of sodium daily. Medications as prescribed. Follow up as scheduled.  -     metoprolol succinate (TOPROL-XL) 25 MG 24 hr tablet; Take 1 tablet (25 mg total) by mouth daily. -     CBC with Differential/Platelet -     CMP14+EGFR -     Lipid panel -     Thyroid Panel With TSH  Pure hypercholesterolemia Diet encouraged - increase intake of fresh fruits and vegetables, increase intake of lean proteins. Bake, broil, or grill foods. Avoid fried, greasy, and fatty foods. Avoid fast foods. Increase intake of fiber-rich whole grains. Exercise encouraged - at least 150 minutes per week and advance as tolerated.  Goal BMI < 25. Continue medications as prescribed. Follow up in 3-6 months as discussed.  -     rosuvastatin (CRESTOR) 40 MG tablet; Take 1 tablet (40 mg total) by mouth daily. -     CMP14+EGFR -     Lipid panel  Rheumatoid arthritis involving multiple sites with positive rheumatoid factor (HCC) Followed by rheumatology on a regular basis. -     CBC with Differential/Platelet  Coronary artery disease involving native coronary artery of native heart without angina pectoris Followed by cardiology on a regular basis. No anginal symptoms. Will refill expired NTG.  -     metoprolol succinate (TOPROL-XL) 25 MG 24 hr tablet; Take 1 tablet (25 mg total) by mouth daily. -     nitroGLYCERIN (NITROSTAT) 0.3 MG SL tablet; Place 1 tablet (0.3 mg total) under the tongue every 5 (five) minutes as needed. -     CBC with Differential/Platelet -     CMP14+EGFR -     Lipid panel  Callus of foot Toenail fungus Referral to podiatry placed.  -     Ambulatory referral to Podiatry  Vitamin D deficiency Labs pending. Continue repletion therapy. If indicated, will change repletion dosage. Eat foods rich in Vit D including milk, orange juice, yogurt with vitamin D added, salmon or mackerel,  canned tuna fish, cereals with vitamin D added, and cod liver oil. Get out in the sun but make sure to wear at least SPF 30 sunscreen.  -  VITAMIN D 25 Hydroxy (Vit-D Deficiency, Fractures)     Continue all other maintenance medications.  Follow up plan: Return in about 6 months (around 04/24/2023) for CPE.   Continue healthy lifestyle choices, including diet (rich in fruits, vegetables, and lean proteins, and low in salt and simple carbohydrates) and exercise (at least 30 minutes of moderate physical activity daily).  Educational handout given for abdominal aortic aneurysm   The above assessment and management plan was discussed with the patient. The patient verbalized understanding of and has agreed to the management plan. Patient is aware to call the clinic if they develop any new symptoms or if symptoms persist or worsen. Patient is aware when to return to the clinic for a follow-up visit. Patient educated on when it is appropriate to go to the emergency department.   Kari Baars, FNP-C [facility] [phone]

## 2022-10-23 ENCOUNTER — Other Ambulatory Visit: Payer: Self-pay

## 2022-10-23 DIAGNOSIS — R739 Hyperglycemia, unspecified: Secondary | ICD-10-CM

## 2022-10-23 LAB — SPECIMEN STATUS REPORT

## 2022-10-23 LAB — HGB A1C W/O EAG: Hgb A1c MFr Bld: 5.7 % — ABNORMAL HIGH (ref 4.8–5.6)

## 2022-10-31 ENCOUNTER — Inpatient Hospital Stay: Payer: No Typology Code available for payment source | Attending: Hematology

## 2022-11-06 ENCOUNTER — Inpatient Hospital Stay: Payer: No Typology Code available for payment source | Admitting: Oncology

## 2022-11-07 ENCOUNTER — Ambulatory Visit: Payer: No Typology Code available for payment source | Admitting: Nurse Practitioner

## 2022-11-07 ENCOUNTER — Encounter: Payer: Self-pay | Admitting: Nurse Practitioner

## 2022-11-07 ENCOUNTER — Encounter: Payer: Self-pay | Admitting: Podiatry

## 2022-11-07 ENCOUNTER — Ambulatory Visit (INDEPENDENT_AMBULATORY_CARE_PROVIDER_SITE_OTHER): Payer: No Typology Code available for payment source | Admitting: Podiatry

## 2022-11-07 VITALS — BP 115/76 | HR 88 | Temp 97.8°F | Ht 73.0 in | Wt 200.0 lb

## 2022-11-07 DIAGNOSIS — T162XXA Foreign body in left ear, initial encounter: Secondary | ICD-10-CM

## 2022-11-07 DIAGNOSIS — D492 Neoplasm of unspecified behavior of bone, soft tissue, and skin: Secondary | ICD-10-CM | POA: Diagnosis not present

## 2022-11-07 DIAGNOSIS — B351 Tinea unguium: Secondary | ICD-10-CM | POA: Diagnosis not present

## 2022-11-07 NOTE — Progress Notes (Signed)
   Subjective:    Patient ID: Austin Cox, male    DOB: 1945-06-04, 77 y.o.   MRN: [id number]   Chief Complaint: foreign body in left ear  HPI  Piece of hearing aid in left ear. Patient Active Problem List   Diagnosis Date Noted   Failure to thrive (child) 03/30/2022   Generalized weakness 03/30/2022   COVID-19 03/29/2022   Failure to thrive in adult 03/29/2022   Thoracic aortic aneurysm without rupture (HCC) 02/11/2022   AAA (abdominal aortic aneurysm) (HCC) 11/14/2021   Cerebrovascular small vessel disease 09/18/2021   Gait instability 09/18/2021   Asymptomatic carotid artery stenosis without infarction, left 04/15/2021   Carotid artery stenosis 04/09/2021   Chronic pain syndrome 03/19/2021   Hard of hearing 03/19/2021   Hypertensive retinopathy of both eyes 11/15/2019   Pseudophakia of both eyes 11/15/2019   Spondylosis without myelopathy or radiculopathy, lumbar region 10/24/2018   Rheumatoid arthritis involving multiple sites with positive rheumatoid factor (HCC) 10/24/2018   Chronic myofascial pain 10/24/2018   DDD (degenerative disc disease), cervical 10/24/2018   Cervical spondylosis without myelopathy 10/24/2018   Thrombocytopenia (HCC) 06/02/2017   Vitamin D deficiency 02/17/2017   Combined forms of age-related cataract of right eye 08/28/2016   Gastroesophageal reflux disease without esophagitis 12/18/2015   Chronic bilateral low back pain without sciatica 12/18/2015   Benign prostatic hyperplasia 12/18/2015   Pure hypercholesterolemia 10/17/2015   S/P coronary artery stent placement 12/21/2014   Coronary artery disease involving native coronary artery of native heart without angina pectoris 12/21/2014   Essential (primary) hypertension 10/11/2013   Nerve root pain 08/18/2013       Review of Systems  Constitutional:  Negative for diaphoresis.  Eyes:  Negative for pain.  Respiratory:  Negative for shortness of breath.   Cardiovascular:  Negative for  chest pain, palpitations and leg swelling.  Gastrointestinal:  Negative for abdominal pain.  Endocrine: Negative for polydipsia.  Skin:  Negative for rash.  Neurological:  Negative for dizziness, weakness and headaches.  Hematological:  Does not bruise/bleed easily.  All other systems reviewed and are negative.      Objective:   Physical Exam Constitutional:      Appearance: Normal appearance.  HENT:     Left Ear: A foreign body (piece of hearing aid) is present.  Neurological:     Mental Status: He is alert.     BP 115/76   Pulse 88   Temp 97.8 F (36.6 C) (Skin)   Ht 6\' 1"  (1.854 m)   Wt 200 lb (90.7 kg)   BMI 26.39 kg/m   Foreign body removed form left ear canal with alligator forceps      Assessment & Plan:   WEYMAN MAUZEY in today with chief complaint of foreign boidy I n left ear canal  1. Foreign body in auricle of left ear, initial encounter Do not stick anything in ear    The above assessment and management plan was discussed with the patient. The patient verbalized understanding of and has agreed to the management plan. Patient is aware to call the clinic if symptoms persist or worsen. Patient is aware when to return to the clinic for a follow-up visit. Patient educated on when it is appropriate to go to the emergency department.   Mary-Margaret Daphine Deutscher, FNP

## 2022-11-07 NOTE — Progress Notes (Signed)
  Subjective:  Patient ID: Austin Cox, male    DOB: 1946/02/02,   MRN: [id number]  No chief complaint on file.   77 y.o. male presents for concern of calluses and fungus on his toenails. Relates concern of injury to right fourth toenail and possible fungus to the great toenails. Relates they have been putting vicks vaporub but not sure they have noticed a difference Patient with a history of RA.   PCP:  Sonny Masters, FNP    . Denies any other pedal complaints. Denies n/v/f/c.   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 CFT <3 seconds. Normal hair growth on digits. No edema.  Skin. No lacerations or abrasions bilateral feet. Fourth diigt nail on right with hemorrhage underlying no acitve bleeding and dystrophic changes. Bilateral hallux nails with dystrophic changes and thickened. Plantar left fifth metatarsal hyperkeratosis noted.  Musculoskeletal: MMT 5/5 bilateral lower extremities in DF, PF, Inversion and Eversion. Deceased ROM in DF of ankle joint.  Neurological: Sensation intact to light touch.   Assessment:   1. Onychomycosis   2. Skin neoplasm      Plan:  Patient was evaluated and treated and all questions answered. -Examined patient -Hyperkeratotic tissue debrided as courtesy.  -Discussed treatment options for painful dystrophic nails  -Clinical picture and Fungal culture was obtained by removing a portion of the hard nail itself from each of the involved toenails using a sterile nail nipper and sent to [facility] lab. Patient tolerated the biopsy procedure well without discomfort or need for anesthesia.  -Discussed fungal nail treatment options including oral, topical, and laser treatments.  -Patient to return in 4 weeks for follow up  evaluation and discussion of fungal culture results or sooner if symptoms worsen.   Louann Sjogren, DPM

## 2022-11-13 ENCOUNTER — Other Ambulatory Visit: Payer: Self-pay | Admitting: Family Medicine

## 2022-12-03 ENCOUNTER — Telehealth: Payer: Self-pay

## 2022-12-03 ENCOUNTER — Other Ambulatory Visit: Payer: Self-pay | Admitting: Family Medicine

## 2022-12-03 DIAGNOSIS — R2681 Unsteadiness on feet: Secondary | ICD-10-CM

## 2022-12-03 DIAGNOSIS — M47816 Spondylosis without myelopathy or radiculopathy, lumbar region: Secondary | ICD-10-CM

## 2022-12-03 DIAGNOSIS — M0579 Rheumatoid arthritis with rheumatoid factor of multiple sites without organ or systems involvement: Secondary | ICD-10-CM

## 2022-12-03 NOTE — Telephone Encounter (Signed)
Wife would like walker for patient. Spoke with insurance and they said on the rx it needed to have hight and weight.  Would like sent to [facility] pharmacy.

## 2022-12-03 NOTE — Telephone Encounter (Signed)
Faxed to [facility]

## 2022-12-03 NOTE — Telephone Encounter (Signed)
Printed, pt can pick up.

## 2022-12-05 ENCOUNTER — Ambulatory Visit (INDEPENDENT_AMBULATORY_CARE_PROVIDER_SITE_OTHER): Payer: No Typology Code available for payment source | Admitting: Podiatry

## 2022-12-05 DIAGNOSIS — Z91199 Patient's noncompliance with other medical treatment and regimen due to unspecified reason: Secondary | ICD-10-CM

## 2022-12-05 NOTE — Progress Notes (Signed)
No show

## 2022-12-11 ENCOUNTER — Inpatient Hospital Stay: Payer: No Typology Code available for payment source

## 2022-12-16 ENCOUNTER — Inpatient Hospital Stay: Payer: No Typology Code available for payment source | Attending: Hematology

## 2022-12-16 DIAGNOSIS — M0500 Felty's syndrome, unspecified site: Secondary | ICD-10-CM | POA: Diagnosis not present

## 2022-12-16 DIAGNOSIS — D696 Thrombocytopenia, unspecified: Secondary | ICD-10-CM | POA: Diagnosis not present

## 2022-12-16 DIAGNOSIS — D61818 Other pancytopenia: Secondary | ICD-10-CM | POA: Diagnosis not present

## 2022-12-16 DIAGNOSIS — D72821 Monocytosis (symptomatic): Secondary | ICD-10-CM | POA: Insufficient documentation

## 2022-12-16 DIAGNOSIS — D649 Anemia, unspecified: Secondary | ICD-10-CM | POA: Diagnosis not present

## 2022-12-16 DIAGNOSIS — I712 Thoracic aortic aneurysm, without rupture, unspecified: Secondary | ICD-10-CM | POA: Insufficient documentation

## 2022-12-16 DIAGNOSIS — Z8616 Personal history of COVID-19: Secondary | ICD-10-CM | POA: Diagnosis not present

## 2022-12-16 DIAGNOSIS — M069 Rheumatoid arthritis, unspecified: Secondary | ICD-10-CM | POA: Diagnosis not present

## 2022-12-16 DIAGNOSIS — I714 Abdominal aortic aneurysm, without rupture, unspecified: Secondary | ICD-10-CM | POA: Diagnosis not present

## 2022-12-16 LAB — CBC WITH DIFFERENTIAL/PLATELET
Abs Immature Granulocytes: 0.19 10*3/uL — ABNORMAL HIGH (ref 0.00–0.07)
Basophils Absolute: 0 10*3/uL (ref 0.0–0.1)
Basophils Relative: 0 %
Eosinophils Absolute: 0.1 10*3/uL (ref 0.0–0.5)
Eosinophils Relative: 1 %
HCT: 36.4 % — ABNORMAL LOW (ref 39.0–52.0)
Hemoglobin: 11.5 g/dL — ABNORMAL LOW (ref 13.0–17.0)
Immature Granulocytes: 3 %
Lymphocytes Relative: 27 %
Lymphs Abs: 1.6 10*3/uL (ref 0.7–4.0)
MCH: 25.7 pg — ABNORMAL LOW (ref 26.0–34.0)
MCHC: 31.6 g/dL (ref 30.0–36.0)
MCV: 81.3 fL (ref 80.0–100.0)
Monocytes Absolute: 2.5 10*3/uL — ABNORMAL HIGH (ref 0.1–1.0)
Monocytes Relative: 42 %
Neutro Abs: 1.6 10*3/uL — ABNORMAL LOW (ref 1.7–7.7)
Neutrophils Relative %: 27 %
Platelets: 76 10*3/uL — ABNORMAL LOW (ref 150–400)
RBC: 4.48 MIL/uL (ref 4.22–5.81)
RDW: 18.1 % — ABNORMAL HIGH (ref 11.5–15.5)
WBC: 5.9 10*3/uL (ref 4.0–10.5)
nRBC: 0 % (ref 0.0–0.2)

## 2022-12-16 LAB — COMPREHENSIVE METABOLIC PANEL
ALT: 17 U/L (ref 0–44)
AST: 17 U/L (ref 15–41)
Albumin: 3.5 g/dL (ref 3.5–5.0)
Alkaline Phosphatase: 48 U/L (ref 38–126)
Anion gap: 8 (ref 5–15)
BUN: 14 mg/dL (ref 8–23)
CO2: 25 mmol/L (ref 22–32)
Calcium: 9.1 mg/dL (ref 8.9–10.3)
Chloride: 101 mmol/L (ref 98–111)
Creatinine, Ser: 1.06 mg/dL (ref 0.61–1.24)
GFR, Estimated: >60 mL/min (ref >60)
Glucose, Bld: 96 mg/dL (ref 70–99)
Potassium: 3.6 mmol/L (ref 3.5–5.1)
Sodium: 134 mmol/L — ABNORMAL LOW (ref 135–145)
Total Bilirubin: 0.9 mg/dL (ref 0.3–1.2)
Total Protein: 6.7 g/dL (ref 6.5–8.1)

## 2022-12-16 LAB — LACTATE DEHYDROGENASE: LDH: 108 U/L (ref 98–192)

## 2022-12-18 ENCOUNTER — Inpatient Hospital Stay: Payer: No Typology Code available for payment source | Admitting: Oncology

## 2022-12-18 VITALS — BP 110/78 | HR 77 | Temp 97.8°F | Resp 19

## 2022-12-18 DIAGNOSIS — D61818 Other pancytopenia: Secondary | ICD-10-CM | POA: Diagnosis not present

## 2022-12-18 DIAGNOSIS — D696 Thrombocytopenia, unspecified: Secondary | ICD-10-CM

## 2022-12-18 DIAGNOSIS — R5383 Other fatigue: Secondary | ICD-10-CM | POA: Diagnosis not present

## 2022-12-18 NOTE — Progress Notes (Signed)
[facility] [address], [place] [address]   CLINIC:  Medical Oncology/Hematology  PCP:  Sonny Masters, FNP [address] [place] [address] [phone]   REASON FOR VISIT:  Follow-up for thrombocytopenia with Felty syndrome  PRIOR THERAPY: None  CURRENT THERAPY: Surveillance  INTERVAL HISTORY:   Austin Cox 77 y.o. male returns for routine follow-up of thrombocytopenia.  He was last seen by Rojelio Brenner PA-C on 06/23/2022.  Since his last visit, he reports feeling well his appetite is 100% energy levels are low.  Has 7 out of 10 pain in bilateral knees.  Reports he woke up yesterday morning with bleeding to his left ear.  Bleeding has since stopped although he is uncertain what happened.  No pain to his ear.  Reports he does wear hearing aids and a few times the tip of his hearing aid has been stuck in his ear and needed to be removed by his PCP.  Last episode was approximately 2 weeks ago.  He denies any recent hospitalizations, surgeries or changes in his baseline health.  He denies any B symptoms.   Chronic fatigue is at baseline.    He has easy bruising, takes aspirin 81 mg daily.   He denies any major bleeding events, recurrent infections, masses, or lymphadenopathy.    He is currently on methotrexate 4 tablets weekly, as well as Enbrel.  He denies any changes in bowel habits.   He has 10% energy and 100% appetite. He endorses that he is maintaining a stable weight.   ASSESSMENT   1.  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  Limited data prior to 2016 with normal platelets noted in 2009. - For the most part, thrombocytopenia from 2016-2022 has been mild, with platelets 100-150. - Downtrending platelets since May 2022 with platelets ranging between 70-90 - Diagnosed with rheumatoid arthritis around 2018 -  Has been on methotrexate since about 2019, recently had methotrexate dose decreased to 5 tablets weekly on 08/05/2021.  Reduced to 4 tablets weekly as of 10/03/2021. - He has been on Enbrel since about 2020 - Hematology work-up (09/20/2021): Flow cytometry: No monoclonal B-cell population or significant T-cell abnormalities identified.  (Negative for LGL leukemia, which can be seen in rheumatoid arthritis) Negative hepatitis C and hepatitis B Normal copper, folate, B12, methylmalonic acid SPEP negative.  Mildly elevated kappa free light chain 32.7 with normal lambda free light chain and light chain ratio.  Normal LDH. Reticulocyte 0.9% - Abdominal ultrasound (10/23/2021): Splenomegaly measuring 14.9 cm diameter, normal-appearing liver. - Bone marrow biopsy (12/12/2021): Hypercellular bone marrow with trilineage hematopoiesis.  Adequate number of megakaryocytes.  Mild myeloid changes present with no increase in blastic cells.  Overall features nonspecific/nondiagnostic, likely secondary in nature due to patient's known history of rheumatoid arthritis and methotrexate treatment, as well as possibly due to hypersplenism.  No ev close idence of lymphoproliferative process. - Cytogenetics (12/12/2021): 45,X,-Y[20].  Loss of Y chromosome noted, which is nonspecific; can happen in older men, but also can be seen in some hematologic malignancies and MDS in setting of clinical correlation. - PET scan (01/09/2022): Mild splenomegaly without any abnormal hypermetabolic activity.  Findings are NOT suggestive of lymphoma.  No hypermetabolic adenopathy on PET scan.  Additional findings discussed below. - Patient had severe thrombocytopenia during COVID-19 infection in January 2024, with platelets as low as 18,000 on 03/29/2022,  but recovered to 32,000 at time of discharge (03/31/2022).  Plan: Thrombocytopenia/anemia/leukopenia: - Etiology thought to be secondary to benign splenomegaly and/or immune mediated thrombocytopenia  in the setting of RA -CBC from (12/16/2022)-platelets 76,000, WBC 5.9, elevated monocytes 1.4, hemoglobin 12.2/hematocrit 37.4 and MCV 80.  ANC 1.6. - Reports easy bruising and 24 hours of bleeding from his left ear.  Denies any B symptoms, recurrent infections, or lymphadenopathy. -Recommend every 38-month lab work and reevaluation.   2.  Monocytosis - Persistent monocytosis since July 2023 with monocytes 1.1 (09/20/2021), 2.2 (12/12/2021), and 2.2 (06/18/2022) -Most recent labs from 12/16/2022 show an absolute monocyte count 2.5 (2.2).  -Etiology thought to be reactive secondary to RA. -Recommend repeat bone marrow if significant changes from baseline.  3.  Anemia -Hemoglobin from 12/16/2022 11.5.  MCV 81.3. -Baseline hemoglobin is around 12. -No active bleeding per patient. -Recommend rechecking nutritional labs at his next visit including vitamin D, ferritin, iron panel, MMA, B12, folate and copper.  Patient would like to have these labs drawn at [facility].   3.  Abnormal appearance of colon on PET scan - PET scan (obtained 01/09/2022 due to unexplained splenomegaly/thrombocytopenia) showed focus intense focal metabolic activity in the cecum at the level of the orifice of the appendix - Patient denies any bright blood per rectum, melena, or changes in bowel habits.  No unintentional weight loss. - Last colonoscopy was about 10 years ago, per patient. -She has not followed up with gastroenterology as we recommended but plans to do so.    4.  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 - Abdominal ultrasound (10/23/2021) with incidental finding of aneurysmal dilation of abdominal aorta with proximal abdominal aorta measuring 5.1 cm in diameter -Continue follow-up with Dr. Chestine Spore.   5.  Rheumatoid arthritis - Predominantly involves knees and ankles  for the past 4 to 5 years.  Seen by Dr. Talbert Cage at [facility]. - He has been on Enbrel and methotrexate for 2 to 3 years - Methotrexate dose decreased to 4 tablets weekly on 10/03/2021.   6.  Other history - PMH: Rheumatoid arthritis, hypertension, hyperlipidemia, carotid artery stenosis, small vessel cerebrovascular disease, coronary artery disease - Walks with the help of cane/walker secondary to rheumatoid arthritis. 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  Exposure to gasoline and diesel present.  Quit smoking 45 years ago. - No family history of low platelets.  Sister died of metastatic breast cancer.  Daughter died of non-Hodgkin's lymphoma.  Mother had head and neck cancer.   PLAN SUMMARY: >> Recommend follow-up in 4 months with labs at [facility] a week before.  [facility] orders placed and given to patient and wife to present prior to his appointment with [place] in 4 months.      REVIEW OF SYSTEMS:   Review of Systems  Constitutional:  Positive for fatigue.  HENT:          Left ear bleeding X 24 hours  Gastrointestinal:  Negative for abdominal pain, blood in stool, diarrhea and nausea.  Musculoskeletal:  Positive for gait problem.  Neurological:  Positive for gait problem and numbness.  Hematological:  Bruises/bleeds easily.     PHYSICAL EXAM:  ECOG PERFORMANCE STATUS: 2 - Symptomatic, <50% confined to bed  Vitals:   12/18/22 0838  BP: 110/78  Pulse: 77  Resp: 19  Temp: 97.8 F (  36.6 C)  SpO2: 98%   There were no vitals filed for this visit. Physical Exam Constitutional:      General: He is not in acute distress.    Appearance: Normal appearance.  HENT:     Ears:     Comments: Left ear with dried blood to inner tragus. No Pain with manipulation. No signs of infection.  Cardiovascular:     Rate and Rhythm: Normal rate and regular rhythm.  Pulmonary:     Effort: Pulmonary effort is normal.     Breath sounds: Normal breath sounds.   Neurological:     Mental Status: He is alert.     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   Past Surgical History:  Procedure Laterality Date   CATARACT EXTRACTION     ENDARTERECTOMY Left 04/15/2021   Procedure: LEFT CAROTID ENDARTERECTOMY WITH;  Surgeon: Cephus Shelling, MD;  Location: [facility] OR;  Service: Vascular;  Laterality: Left;   EYE SURGERY     detached retina right eye   FRACTURE SURGERY     right leg   Stent in heart      SOCIAL HISTORY:  Social History   Socioeconomic History   Marital status: Married    Spouse name: Not on file   Number of children: 3   Years of education: Not on file   Highest education level: Not on file  Occupational History   Occupation: retired  Tobacco Use   Smoking status: Former    Current packs/day: 0.00    Average packs/day: 0.2 packs/day for 10.0 years (2.0 ttl pk-yrs)    Types: Cigarettes    Start date: 07/05/1964    Quit date: 07/06/1974    Years since quitting: 48.4   Smokeless tobacco: Never  Vaping Use   Vaping status: Never Used  Substance and Sexual Activity   Alcohol use: Not Currently    Alcohol/week: 1.0 standard drink of alcohol    Types: 1 Cans of beer per week   Drug use: Never   Sexual activity: Not on file  Other Topics Concern   Not on file  Social History Narrative   3 children - one passed away   Son in [place]   They raised 2 teenage grandchildren after their daughter and son in law both passed away    Social Determinants of Health   Financial Resource Strain: Low Risk  (05/22/2022)   Received from [facility], [facility]   Overall Financial Resource Strain (CARDIA)    Difficulty of Paying Living Expenses: Not hard at all  Recent Concern: Financial Resource Strain - High Risk (04/08/2022)    Overall Financial Resource Strain (CARDIA)    Difficulty of Paying Living Expenses: Hard  Food Insecurity: No Food Insecurity (05/22/2022)   Received from [facility], [facility]   Hunger Vital Sign    Worried About Running Out of Food in the Last Year: Never true    Ran Out of Food in the Last Year: Never true  Transportation Needs: No Transportation Needs (05/22/2022)   Received from [facility], [facility]   PRAPARE - Transportation    Lack of Transportation (Medical): No    Lack of Transportation (Non-Medical): No  Physical Activity: Inactive (04/08/2022)   Exercise  Vital Sign    Days of Exercise per Week: 0 days    Minutes of Exercise per Session: 0 min  Stress: No Stress Concern Present (04/08/2022)   [employer] of Occupational Health - Occupational Stress Questionnaire    Feeling of Stress : Not at all  Social Connections: Moderately Isolated (04/08/2022)   Social Connection and Isolation Panel [NHANES]    Frequency of Communication with Friends and Family: More than three times a week    Frequency of Social Gatherings with Friends and Family: More than three times a week    Attends Religious Services: Never    Database administrator or Organizations: No    Attends Banker Meetings: Never    Marital Status: Married  Catering manager Violence: Not At Risk (04/08/2022)   Humiliation, Afraid, Rape, and Kick questionnaire    Fear of Current or Ex-Partner: No    Emotionally Abused: No    Physically Abused: No    Sexually Abused: No    FAMILY HISTORY:  No family history on file.  CURRENT MEDICATIONS:  Outpatient Encounter Medications as of 12/18/2022  Medication Sig   acetaminophen (TYLENOL) 650 MG CR tablet Take 650 mg by mouth every 8 (eight) hours as needed for pain.   aspirin 81 MG EC tablet Take 81 mg by mouth daily.   baclofen (LIORESAL) 10 MG tablet Take 10 mg by mouth 2 (two) times daily.   Cholecalciferol 25 MCG (1000 UT) capsule Take 1  capsule (1,000 Units total) by mouth daily.   clotrimazole (CLOTRIMAZOLE ANTI-FUNGAL) 1 % cream Apply 1 Application topically 2 (two) times daily.   diclofenac Sodium (VOLTAREN) 1 % GEL PLACE ONTO THE SKIN 4 (FOUR) TIMES A DAY AS NEEDED FOR UP TO 30 DAYS.   etanercept (ENBREL SURECLICK) 50 MG/ML injection Inject 50 mg into the skin every Friday.   folic acid (FOLVITE) 1 MG tablet Take 1 mg by mouth daily.   gabapentin (NEURONTIN) 300 MG capsule Take 300 mg by mouth 2 (two) times daily.   methotrexate (RHEUMATREX) 2.5 MG tablet Take 4 mg by mouth every Tuesday.   metoprolol succinate (TOPROL-XL) 25 MG 24 hr tablet Take 1 tablet (25 mg total) by mouth daily.   nitroGLYCERIN (NITROSTAT) 0.3 MG SL tablet Place 1 tablet (0.3 mg total) under the tongue every 5 (five) minutes as needed.   rosuvastatin (CRESTOR) 40 MG tablet Take 1 tablet (40 mg total) by mouth daily.   Saw Palmetto 450-15 MG CAPS Take 2 tablets by mouth daily.   Tetrahydrozoline HCl (VISINE OP) Place 1 drop into both eyes daily as needed (dry eyes).   triamcinolone cream (KENALOG) 0.1 % Apply 1 Application topically 2 (two) times daily.   No facility-administered encounter medications on file as of 12/18/2022.    ALLERGIES:  No Known Allergies  LABORATORY DATA:  I have reviewed the labs as listed.  CBC    Component Value Date/Time   WBC 5.9 12/16/2022 1117   RBC 4.48 12/16/2022 1117   HGB 11.5 (L) 12/16/2022 1117   HGB 12.2 (L) 10/22/2022 1021   HCT 36.4 (L) 12/16/2022 1117   HCT 37.4 (L) 10/22/2022 1021   PLT 76 (L) 12/16/2022 1117   PLT 65 (LL) 10/22/2022 1021   MCV 81.3 12/16/2022 1117   MCV 80 10/22/2022 1021   MCH 25.7 (L) 12/16/2022 1117   MCHC 31.6 12/16/2022 1117   RDW 18.1 (H) 12/16/2022 1117   RDW 17.5 (H) 10/22/2022 1021  LYMPHSABS 1.6 12/16/2022 1117   LYMPHSABS 1.4 10/22/2022 1021   MONOABS 2.5 (H) 12/16/2022 1117   EOSABS 0.1 12/16/2022 1117   EOSABS 0.1 10/22/2022 1021   BASOSABS 0.0 12/16/2022  1117   BASOSABS 0.0 10/22/2022 1021      Latest Ref Rng & Units 12/16/2022   11:17 AM 10/22/2022   10:21 AM 04/09/2022   11:16 AM  CMP  Glucose 70 - 99 mg/dL 96  161  096   BUN 8 - 23 mg/dL 14  15  9    Creatinine 0.61 - 1.24 mg/dL 0.45  4.09  8.11   Sodium 135 - 145 mmol/L 134  139  138   Potassium 3.5 - 5.1 mmol/L 3.6  4.1  3.7   Chloride 98 - 111 mmol/L 101  102  100   CO2 22 - 32 mmol/L 25  25  24    Calcium 8.9 - 10.3 mg/dL 9.1  9.5  8.6   Total Protein 6.5 - 8.1 g/dL 6.7  6.8  6.4   Total Bilirubin 0.3 - 1.2 mg/dL 0.9  0.8  0.9   Alkaline Phos 38 - 126 U/L 48  68  77   AST 15 - 41 U/L 17  18  115   ALT 0 - 44 U/L 17  17  153     DIAGNOSTIC IMAGING:  I have independently reviewed the relevant imaging and discussed with the patient.   WRAP UP:  All questions were answered. The patient knows to call the clinic with any problems, questions or concerns.  Medical decision making: Moderate  Time spent on visit: I spent 20 minutes counseling the patient face to face. The total time spent in the appointment was 30 minutes and more than 50% was on counseling.  Mauro Kaufmann, NP  12/18/22 8:42 AM

## 2022-12-24 ENCOUNTER — Other Ambulatory Visit: Payer: Self-pay | Admitting: Family Medicine

## 2022-12-24 DIAGNOSIS — L89311 Pressure ulcer of right buttock, stage 1: Secondary | ICD-10-CM

## 2022-12-24 DIAGNOSIS — B372 Candidiasis of skin and nail: Secondary | ICD-10-CM

## 2022-12-24 NOTE — Telephone Encounter (Signed)
Last office visit 11/07/22 with MMM Last refill 05/01/22, 60 grams, 1 refill

## 2023-01-08 ENCOUNTER — Other Ambulatory Visit: Payer: No Typology Code available for payment source

## 2023-01-08 DIAGNOSIS — Z79899 Other long term (current) drug therapy: Secondary | ICD-10-CM | POA: Diagnosis not present

## 2023-01-08 DIAGNOSIS — M0579 Rheumatoid arthritis with rheumatoid factor of multiple sites without organ or systems involvement: Secondary | ICD-10-CM | POA: Diagnosis not present

## 2023-01-13 ENCOUNTER — Other Ambulatory Visit: Payer: Self-pay | Admitting: Family Medicine

## 2023-01-13 DIAGNOSIS — L2489 Irritant contact dermatitis due to other agents: Secondary | ICD-10-CM

## 2023-01-15 ENCOUNTER — Telehealth: Payer: Self-pay | Admitting: Family Medicine

## 2023-01-15 DIAGNOSIS — R2681 Unsteadiness on feet: Secondary | ICD-10-CM

## 2023-01-15 DIAGNOSIS — M47816 Spondylosis without myelopathy or radiculopathy, lumbar region: Secondary | ICD-10-CM

## 2023-01-15 DIAGNOSIS — M0579 Rheumatoid arthritis with rheumatoid factor of multiple sites without organ or systems involvement: Secondary | ICD-10-CM

## 2023-01-15 NOTE — Telephone Encounter (Signed)
Fax sent to [facility]

## 2023-01-15 NOTE — Telephone Encounter (Signed)
Call from [employer]. [facility]ax needs to go to [facility] [phone]

## 2023-01-15 NOTE — Telephone Encounter (Signed)
Aware order sent to [facility]

## 2023-01-15 NOTE — Telephone Encounter (Signed)
Fax from [employer], ins is out of network, will be private pay.

## 2023-01-16 NOTE — Telephone Encounter (Signed)
Information faxed to [facility] at [phone]

## 2023-01-20 DIAGNOSIS — M47816 Spondylosis without myelopathy or radiculopathy, lumbar region: Secondary | ICD-10-CM | POA: Diagnosis not present

## 2023-01-20 NOTE — Telephone Encounter (Signed)
Authorization from [facility] received for Austin Cox # [id number] Order & authorization faxed to [facility].

## 2023-01-26 NOTE — Telephone Encounter (Unsigned)
Copied from CRM [id number]. Topic: Clinical - Home Health Verbal Orders >> Jan 26, 2023 12:36 PM Almira Coaster wrote: Caller/Agency: Marcelino Duster from [facility] Callback Number: [phone] (Case # [id number]) Service Requested: Regarding an order for the walker. Patient needs a walker with wheels and a seat. Please fax new order to [facility] Fax:[phone], [phone] Frequency:  Any new concerns about the patient? Yes

## 2023-01-27 NOTE — Telephone Encounter (Signed)
Order and Authorization faxed again to [facility] to fax# [phone]

## 2023-02-02 ENCOUNTER — Telehealth: Payer: Self-pay | Admitting: Family Medicine

## 2023-02-02 NOTE — Telephone Encounter (Signed)
LMOVM to return call.

## 2023-02-02 NOTE — Telephone Encounter (Signed)
Copied from CRM [id number]. Topic: General - Other >> Feb 02, 2023  9:47 AM Georgeanna Harrison H wrote: Reason for CRM: Pts wife is wanting someone to reach out to her regarding the walker that was sent to her husband, she states that it is not the correct one.

## 2023-02-02 NOTE — Telephone Encounter (Signed)
Talked with wife about specific kind of walker that pt needs, will get with PCP when she returns to the office in the morning.

## 2023-02-02 NOTE — Telephone Encounter (Signed)
REFER TO PHONE CALL 10/31

## 2023-02-03 ENCOUNTER — Telehealth: Payer: Self-pay | Admitting: Family Medicine

## 2023-02-03 NOTE — Telephone Encounter (Signed)
Copied from CRM [id number]. Topic: General - Other >> Feb 03, 2023  3:12 PM Danika B wrote: Reason for CRM: Misty Stanley from [facility] called stating that patient was sent wrong type of walker, and new order is also incorrect and alerted them to the duplicate order. Needs walker to have a seat and wheels. Needs to be submitted to [facility]. Fax #[phone]. Lisa's callback information is [phone].

## 2023-02-04 NOTE — Telephone Encounter (Signed)
Order faxed to [facility] at [phone]

## 2023-02-04 NOTE — Telephone Encounter (Signed)
Aware new order for walker faxed to [facility]

## 2023-02-04 NOTE — Addendum Note (Signed)
Addended by: Sonny Masters on: 02/04/2023 07:37 AM   Modules accepted: Orders

## 2023-02-04 NOTE — Telephone Encounter (Signed)
Printed and signed. Placed on your desk.

## 2023-02-06 NOTE — Telephone Encounter (Signed)
The dr need's to send over the clinical documentation  height and weight and diagnosis  code  and the reason for the walker   lisa Is the person  calling   in needing this information for the pt  [phone] ext [phone]        fax number [phone].

## 2023-02-06 NOTE — Telephone Encounter (Signed)
Height 6'1", weight 200lbs

## 2023-02-06 NOTE — Telephone Encounter (Signed)
Add't information faxed to [facility].

## 2023-02-09 NOTE — Progress Notes (Unsigned)
Patient name: Austin Cox MRN: [id number] DOB: 1946-01-12 Sex: male  REASON FOR VISIT: 6 month follow-up AAA  HPI: Austin Cox is a 77 y.o. male with history of coronary artery disease status post PCI, rheumatoid arthritis, carotid stenosis presents for 6 month follow-up of abdominal aortic aneurysm.  Patient got a ultrasound on 10/23/2021 showing a 5.1 cm abdominal aortic aneurysm ordered by Rojelio Brenner.  States he had no prior knowledge of the aneurysm.  Patient has chronic back pain but otherwise no new symptoms including abdominal pain.  His AAA last measured 4.7 cm on 07/15/22.   He had a left carotid endarterectomy on 04/15/2021 for an asymptomatic high-grade stenosis with string sign.  Postop course was complicated by a hematoma that was managed with observation.  He does not smoke and quit in the 1980's.   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  SOCIAL HISTORY: Social History   Tobacco Use   Smoking status: Former    Current packs/day: 0.00    Average packs/day: 0.2 packs/day for 10.0 years (2.0 ttl pk-yrs)    Types: Cigarettes    Start date: 07/05/1964    Quit date: 07/06/1974    Years since quitting: 48.6   Smokeless tobacco: Never  Substance Use Topics   Alcohol use: Not Currently    Alcohol/week: 1.0 standard drink of alcohol    Types: 1 Cans of beer per week    No Known Allergies  Current Outpatient Medications   Medication Sig Dispense Refill   acetaminophen (TYLENOL) 650 MG CR tablet Take 650 mg by mouth every 8 (eight) hours as needed for pain.     aspirin 81 MG EC tablet Take 81 mg by mouth daily.     baclofen (LIORESAL) 10 MG tablet Take 10 mg by mouth 2 (two) times daily.     Cholecalciferol 25 MCG (1000 UT) capsule Take 1 capsule (1,000 Units total) by mouth daily. 90 capsule 2   clotrimazole (LOTRIMIN) 1 % cream APPLY TO AFFECTED AREA TWICE A DAY 60 g 1   diclofenac Sodium (VOLTAREN) 1 % GEL PLACE ONTO THE SKIN 4 (FOUR) TIMES A DAY AS NEEDED FOR UP TO 30 DAYS. 100 g 1   etanercept (ENBREL SURECLICK) 50 MG/ML injection Inject 50 mg into the skin every Friday.     folic acid (FOLVITE) 1 MG tablet Take 1 mg by mouth daily.     gabapentin (NEURONTIN) 300 MG capsule Take 300 mg by mouth 2 (two) times daily.     methotrexate (RHEUMATREX) 2.5 MG tablet Take 4 mg by mouth every Tuesday.     metoprolol succinate (TOPROL-XL) 25 MG 24 hr tablet Take 1 tablet (25 mg total) by mouth daily. 100 tablet 2   nitroGLYCERIN (NITROSTAT) 0.3 MG SL tablet Place 1 tablet (0.3  mg total) under the tongue every 5 (five) minutes as needed. 90 tablet 0   rosuvastatin (CRESTOR) 40 MG tablet Take 1 tablet (40 mg total) by mouth daily. 90 tablet 1   Saw Palmetto 450-15 MG CAPS Take 2 tablets by mouth daily.     Tetrahydrozoline HCl (VISINE OP) Place 1 drop into both eyes daily as needed (dry eyes).     triamcinolone cream (KENALOG) 0.1 % APPLY TO AFFECTED AREA TWICE A DAY 30 g 0   No current facility-administered medications for this visit.    REVIEW OF SYSTEMS:  [X]  denotes positive finding, [ ]  denotes negative finding Cardiac  Comments:  Chest pain or chest pressure:    Shortness of breath upon exertion:    Short of breath when lying flat:    Irregular heart rhythm:        Vascular    Pain in calf, thigh, or hip brought on by ambulation:    Pain in feet at night that wakes you up from your sleep:     Blood clot  in your veins:    Leg swelling:         Pulmonary    Oxygen at home:    Productive cough:     Wheezing:         Neurologic    Sudden weakness in arms or legs:     Sudden numbness in arms or legs:     Sudden onset of difficulty speaking or slurred speech:    Temporary loss of vision in one eye:     Problems with dizziness:         Gastrointestinal    Blood in stool:     Vomited blood:         Genitourinary    Burning when urinating:     Blood in urine:        Psychiatric    Major depression:         Hematologic    Bleeding problems:    Problems with blood clotting too easily:        Skin    Rashes or ulcers:        Constitutional    Fever or chills:      PHYSICAL EXAM: There were no vitals filed for this visit.   GENERAL: The patient is a well-nourished male, in no acute distress. The vital signs are documented above. CARDIAC: There is a regular rate and rhythm.  VASCULAR:  Left neck incision well-healed from previous carotid endarterectomy PULMONARY: No respiratory distress. ABDOMEN: Soft and non-tender.  No pain with deep palpation. MUSCULOSKELETAL: There are no major deformities or cyanosis. NEUROLOGIC: No focal weakness or paresthesias are detected.  Cranial nerves II through XII grossly intact SKIN: There are no ulcers or rashes noted. PSYCHIATRIC: The patient has a normal affect.  DATA:   AAA duplex here on 07/15/2022 shows 4.7 cm abdominal aortic aneurysm  Carotid ultrasound 07/15/2022 with minimal 1 to 39% carotid stenosis bilaterally  US abdomen 10/23/2021:  5.1 cm AAA  Assessment/Plan:  77 year old male well-known to our practice that has previously undergone a left carotid endarterectomy on 04/15/2021 for an asymptomatic high-grade stenosis.  He was referred back for a new finding of a 5.1 cm abdominal aortic aneurysm last year.  On 30-month follow-up the repeat AAA duplex here in our office shows a 4.7 cm aneurysm.  \  Discussed guidelines for  repair of abdominal aortic aneurysm is greater than 5.5 cm.  I  will follow-up with him again in 6 months with repeat AAA ultrasound.  As it relates to his carotid artery disease, he has minimal 1 to 39% stenosis bilaterally on duplex from 07/15/22.  Will repeat in 6 months.  Cephus Shelling, MD [facility] Office: [phone]

## 2023-02-10 ENCOUNTER — Ambulatory Visit (HOSPITAL_COMMUNITY)
Admission: RE | Admit: 2023-02-10 | Discharge: 2023-02-10 | Disposition: A | Discharge status: Home / Self Care | Payer: No Typology Code available for payment source | Source: Ambulatory Visit | Attending: Vascular Surgery | Admitting: Vascular Surgery

## 2023-02-10 ENCOUNTER — Encounter: Payer: Self-pay | Admitting: Vascular Surgery

## 2023-02-10 ENCOUNTER — Ambulatory Visit (INDEPENDENT_AMBULATORY_CARE_PROVIDER_SITE_OTHER): Payer: No Typology Code available for payment source | Admitting: Vascular Surgery

## 2023-02-10 VITALS — BP 125/79 | HR 67 | Temp 98.3°F | Ht 73.0 in | Wt 195.1 lb

## 2023-02-10 DIAGNOSIS — I6523 Occlusion and stenosis of bilateral carotid arteries: Secondary | ICD-10-CM | POA: Diagnosis not present

## 2023-02-10 DIAGNOSIS — M0579 Rheumatoid arthritis with rheumatoid factor of multiple sites without organ or systems involvement: Secondary | ICD-10-CM | POA: Diagnosis not present

## 2023-02-10 DIAGNOSIS — I7143 Infrarenal abdominal aortic aneurysm, without rupture: Secondary | ICD-10-CM | POA: Diagnosis not present

## 2023-02-10 DIAGNOSIS — R2681 Unsteadiness on feet: Secondary | ICD-10-CM | POA: Diagnosis not present

## 2023-02-10 DIAGNOSIS — M47816 Spondylosis without myelopathy or radiculopathy, lumbar region: Secondary | ICD-10-CM | POA: Diagnosis not present

## 2023-02-17 ENCOUNTER — Other Ambulatory Visit: Payer: Self-pay

## 2023-02-17 DIAGNOSIS — I6523 Occlusion and stenosis of bilateral carotid arteries: Secondary | ICD-10-CM

## 2023-02-17 DIAGNOSIS — I7143 Infrarenal abdominal aortic aneurysm, without rupture: Secondary | ICD-10-CM

## 2023-02-23 IMAGING — MR MR HEAD WO/W CM
14 of 16 series · 30 of 48 positions shown · IV contrast (gadavist)
Comparison: Carotid artery duplex ultrasound 03/29/2021.

CLINICAL DATA: Provided history: essential (primary) hypertension.
Bilateral carotid bruit. Gait instability. Ataxia, nontraumatic,
stroke excluded. Additional history provided by scanning
technologist: weakness and difficulty walking for 3 weeks.

EXAM:
MRI HEAD WITHOUT AND WITH CONTRAST
TECHNIQUE: Multiplanar, multiecho pulse sequences of the brain and surrounding
structures were obtained without and with intravenous contrast.
CONTRAST:  10mL GADAVIST GADOBUTROL 1 MMOL/ML IV SOLN

[Series 5: DWI · axial · 3.0mm · 0.77mm/px · z∈[-67,+80]mm · 2 of 50 slices shown (1 of 6)]
[im 1/50]
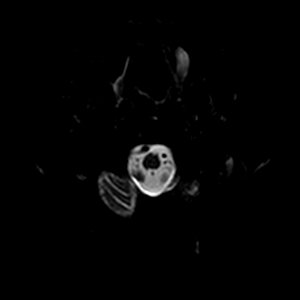
[im 50/50]
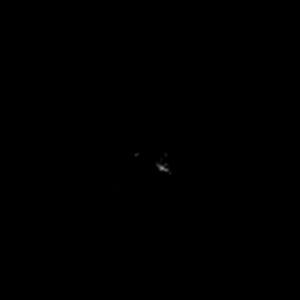

[Series 5: DWI · axial · 3.0mm · 0.77mm/px · z∈[-67,+80]mm · 2 of 50 slices shown (2 of 6)]
[im 1/50]
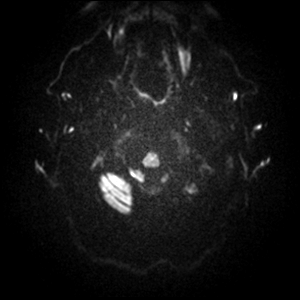
[im 50/50]
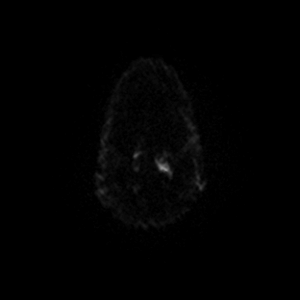

[Series 6: DWI · axial · 3.0mm · 0.77mm/px · z∈[-67,+80]mm · 2 of 50 slices shown (3 of 6)]
[im 1/50]
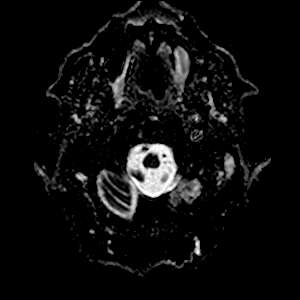
[im 50/50]
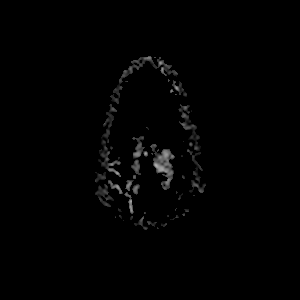

[Series 7: DWI · coronal · 5.0mm · 0.88mm/px · 2 of 33 slices shown (4 of 6)]
[im 1/33]
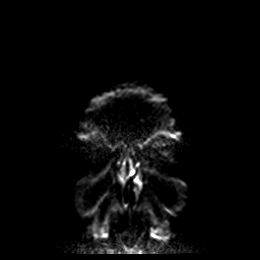
[im 33/33]
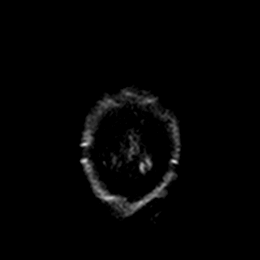

[Series 7: DWI · coronal · 5.0mm · 0.88mm/px · 2 of 33 slices shown (5 of 6)]
[im 1/33]
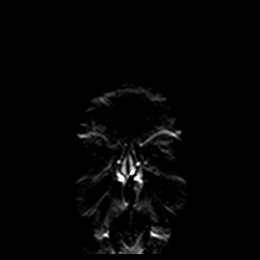
[im 33/33]
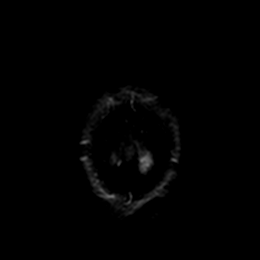

[Series 8: DWI · coronal · 5.0mm · 0.88mm/px · 2 of 33 slices shown (6 of 6)]
[im 1/33]
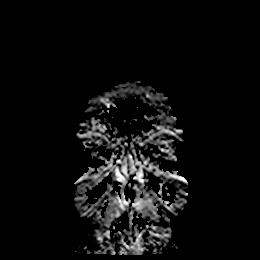
[im 33/33]
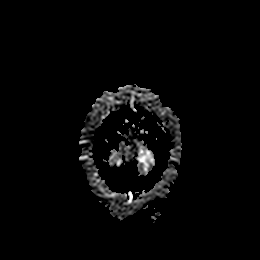

[Series 9: T1 · sagittal · 5.0mm · 0.75mm/px · 1 of 19 slices shown]
[im 1/19]
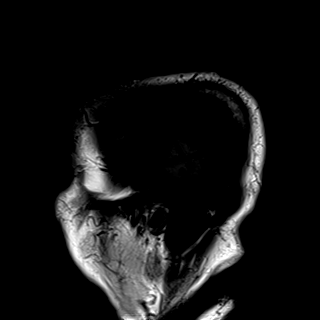

[Series 10: T2 · axial · 5.0mm · 0.72mm/px · 1 of 23 slices shown]
[im 1/23]
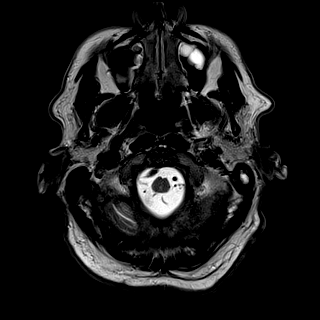

[Series 11: mag_images · axial · 3.0mm · 0.90mm/px · z∈[-66,+111]mm · 3 of 60 slices shown]
[im 1/60]
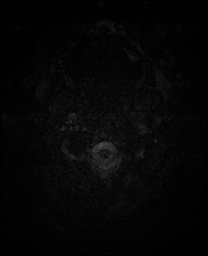
[im 30/60]
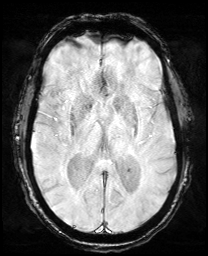
[im 60/60]
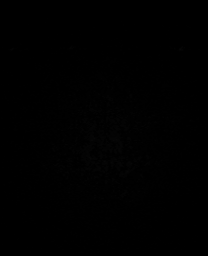

[Series 12: pha_images · axial · 3.0mm · 0.90mm/px · z∈[-66,+105]mm · 3 of 57 slices shown]
[im 1/57]
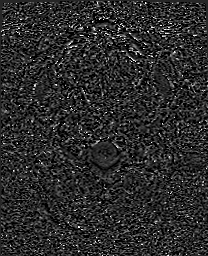
[im 29/57]
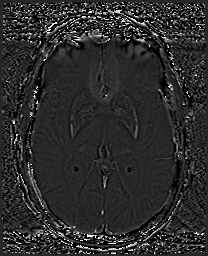
[im 57/57]
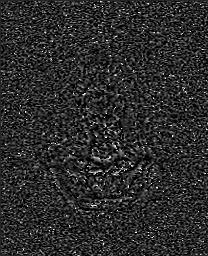

[Series 13: swi_images · axial · 3.0mm · 0.90mm/px · z∈[-66,+111]mm · 3 of 60 slices shown]
[im 1/60]
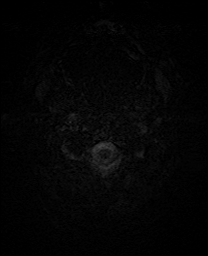
[im 30/60]
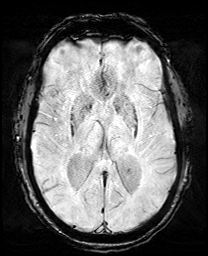
[im 60/60]
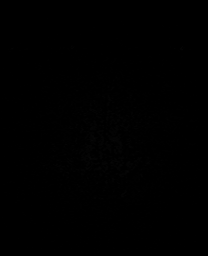

[Series 15: FLAIR · axial · 3.0mm · 0.45mm/px · z∈[-46,+91]mm · 3 of 47 slices shown]
[im 1/47]
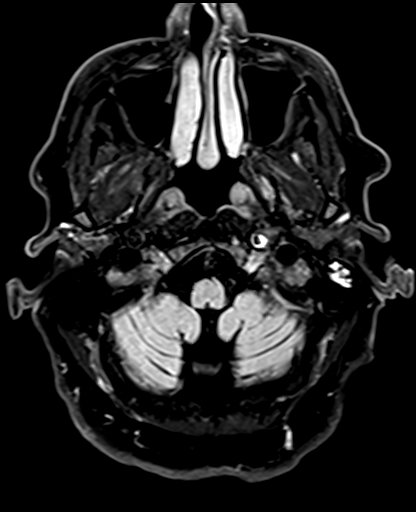
[im 24/47]
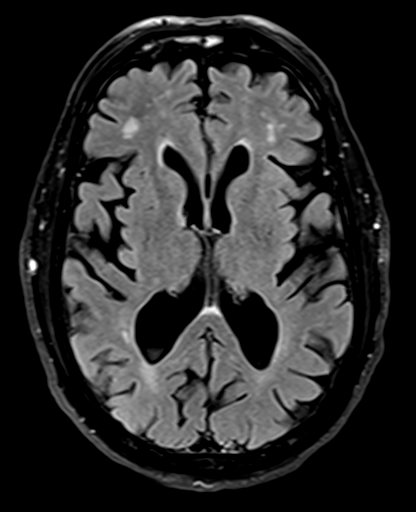
[im 47/47]
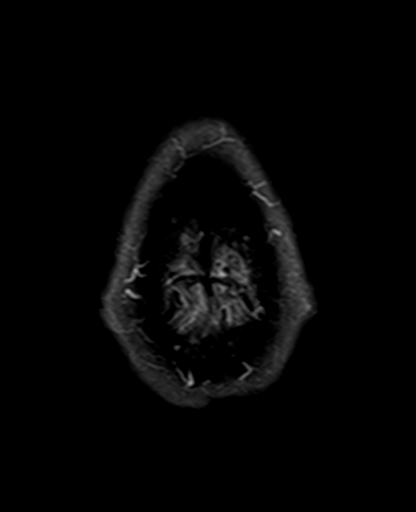

[Series 17: T2 post-contrast · coronal · 5.0mm · 0.72mm/px · 2 of 31 slices shown]
[im 1/31]
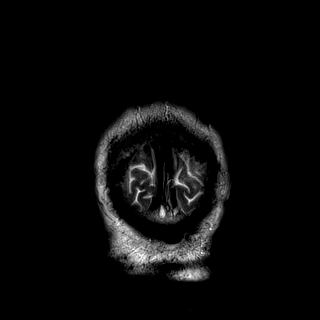
[im 31/31]
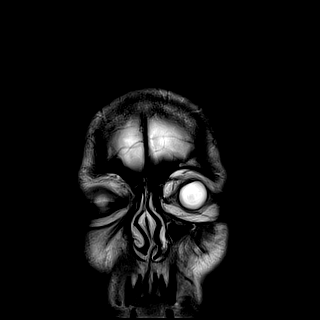

[Series 19: T1 post-contrast · coronal · 5.0mm · 0.34mm/px · 2 of 28 slices shown]
[im 1/28]
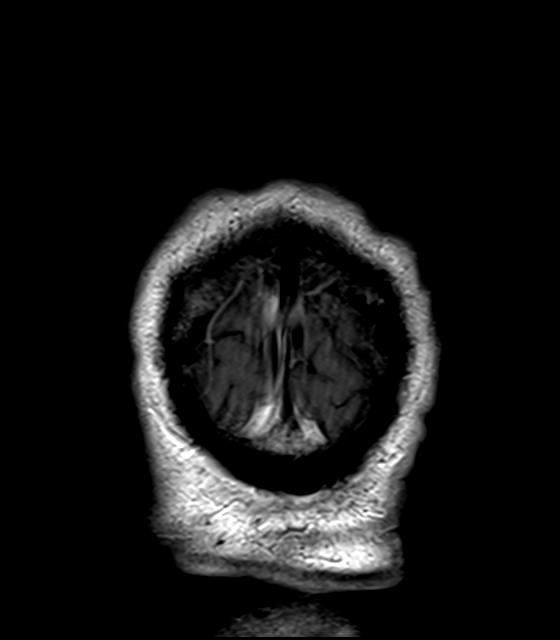
[im 28/28]
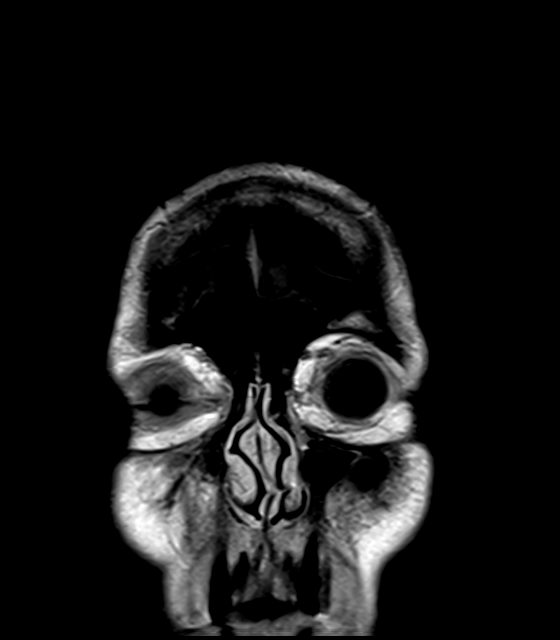

[30 of 48 positions shown; findings below may reference images not displayed]

FINDINGS: Brain:

Mild generalized cerebral and cerebellar atrophy.

Chronic small-vessel infarcts within the right cerebral hemispheric
white matter and bilateral caudate nuclei.

Background mild multifocal T2 FLAIR hyperintense signal abnormality
within the cerebral white matter, nonspecific but compatible chronic
small vessel ischemic disease.

There are a few small supratentorial chronic parenchymal
microhemorrhages.

Small chronic infarcts within the bilateral cerebellar hemispheres.

There is no acute infarct.

No evidence of an intracranial mass.

No extra-axial fluid collection.

No midline shift.

No pathologic intracranial enhancement identified.

Vascular: Signal abnormality within portions of the distal cervical
and intracranial left ICA on T2 weighted imaging likely reflects
slow flow given the severe, near occlusive stenosis demonstrated
within the proximal cervical left ICA on same day duplex carotid
ultrasound. The visualized distal cervical and intracranial left ICA
show enhancement on post-contrast imaging. Flow voids otherwise
preserved within the proximal large arterial vessels.

Skull and upper cervical spine: No focal suspicious marrow lesion.

Sinuses/Orbits: Visualized orbits show no acute finding. Left
scleral buckle. Prior bilateral ocular lens replacement. Mild
mucosal thickening and possible fluid within the bilateral ethmoid
sinuses. Small mucous retention cyst within a posterior right
ethmoid air cell. 2.3 cm mucous retention cyst within the inferior
left maxillary sinus. Minimal background mucosal thickening within
the left maxillary sinus. Small mucous retention cysts, and minimal
background mucosal thickening, within the right maxillary sinus.

Other: Small left mastoid effusion.
IMPRESSION: No evidence of acute intracranial abnormality.

Signal abnormality within portions of the visualized distal cervical
and intracranial left internal carotid artery. This likely reflects
slow flow given the severe, near-occlusive stenosis identified in
the proximal left ICA on the same day carotid duplex ultrasound. The
visualized distal cervical and intracranial left ICA show
enhancement at this time.

Chronic small-vessel infarcts within the right cerebral hemispheric
white matter and bilateral basal ganglia.

Background mild cerebral white matter chronic small vessel ischemic
changes.

Small chronic infarcts within the bilateral cerebellar hemispheres.

Paranasal sinus disease, as described.

Small left mastoid effusion.

## 2023-03-05 ENCOUNTER — Telehealth: Payer: Self-pay | Admitting: Family Medicine

## 2023-03-05 DIAGNOSIS — R2681 Unsteadiness on feet: Secondary | ICD-10-CM

## 2023-03-05 DIAGNOSIS — M47816 Spondylosis without myelopathy or radiculopathy, lumbar region: Secondary | ICD-10-CM

## 2023-03-05 DIAGNOSIS — M0579 Rheumatoid arthritis with rheumatoid factor of multiple sites without organ or systems involvement: Secondary | ICD-10-CM

## 2023-03-05 NOTE — Telephone Encounter (Signed)
Copied from CRM [id number]. Topic: Clinical - Home Health Verbal Orders >> Mar 05, 2023  3:59 PM Dennison Nancy wrote: Caller/Agency: [facility]  Callback Number: [phone] Service Requested: , patient need a heavy duty walker with big wheels  reason he is a tall person  Frequency: n/a Any new concerns about the patient? N/A

## 2023-03-06 NOTE — Telephone Encounter (Signed)
LMOVM new order with specific comments for a heavy duty walker with big wheels and pt's height & weight were included and faxed to [facility] at [phone]

## 2023-03-17 DIAGNOSIS — M47816 Spondylosis without myelopathy or radiculopathy, lumbar region: Secondary | ICD-10-CM | POA: Diagnosis not present

## 2023-03-17 DIAGNOSIS — M0579 Rheumatoid arthritis with rheumatoid factor of multiple sites without organ or systems involvement: Secondary | ICD-10-CM | POA: Diagnosis not present

## 2023-03-17 DIAGNOSIS — R2681 Unsteadiness on feet: Secondary | ICD-10-CM | POA: Diagnosis not present

## 2023-04-11 ENCOUNTER — Other Ambulatory Visit: Payer: Self-pay | Admitting: Family Medicine

## 2023-04-11 DIAGNOSIS — E7800 Pure hypercholesterolemia, unspecified: Secondary | ICD-10-CM

## 2023-04-16 ENCOUNTER — Other Ambulatory Visit: Payer: No Typology Code available for payment source

## 2023-04-17 DIAGNOSIS — M47816 Spondylosis without myelopathy or radiculopathy, lumbar region: Secondary | ICD-10-CM | POA: Diagnosis not present

## 2023-04-17 DIAGNOSIS — M0579 Rheumatoid arthritis with rheumatoid factor of multiple sites without organ or systems involvement: Secondary | ICD-10-CM | POA: Diagnosis not present

## 2023-04-17 DIAGNOSIS — R2681 Unsteadiness on feet: Secondary | ICD-10-CM | POA: Diagnosis not present

## 2023-04-19 ENCOUNTER — Other Ambulatory Visit: Payer: Self-pay | Admitting: Family Medicine

## 2023-04-19 DIAGNOSIS — L2489 Irritant contact dermatitis due to other agents: Secondary | ICD-10-CM

## 2023-04-23 ENCOUNTER — Inpatient Hospital Stay: Payer: No Typology Code available for payment source | Admitting: Oncology

## 2023-04-24 ENCOUNTER — Ambulatory Visit (INDEPENDENT_AMBULATORY_CARE_PROVIDER_SITE_OTHER): Payer: No Typology Code available for payment source | Admitting: Family Medicine

## 2023-04-24 ENCOUNTER — Encounter: Payer: Self-pay | Admitting: Family Medicine

## 2023-04-24 ENCOUNTER — Telehealth: Payer: Self-pay | Admitting: Pharmacy Technician

## 2023-04-24 VITALS — BP 113/76 | HR 68 | Temp 97.8°F | Ht 73.0 in | Wt 206.4 lb

## 2023-04-24 DIAGNOSIS — I10 Essential (primary) hypertension: Secondary | ICD-10-CM

## 2023-04-24 DIAGNOSIS — I2510 Atherosclerotic heart disease of native coronary artery without angina pectoris: Secondary | ICD-10-CM

## 2023-04-24 DIAGNOSIS — Z0001 Encounter for general adult medical examination with abnormal findings: Secondary | ICD-10-CM | POA: Diagnosis not present

## 2023-04-24 DIAGNOSIS — Z0000 Encounter for general adult medical examination without abnormal findings: Secondary | ICD-10-CM

## 2023-04-24 DIAGNOSIS — I7121 Aneurysm of the ascending aorta, without rupture: Secondary | ICD-10-CM | POA: Diagnosis not present

## 2023-04-24 DIAGNOSIS — E559 Vitamin D deficiency, unspecified: Secondary | ICD-10-CM

## 2023-04-24 DIAGNOSIS — E7800 Pure hypercholesterolemia, unspecified: Secondary | ICD-10-CM

## 2023-04-24 DIAGNOSIS — R351 Nocturia: Secondary | ICD-10-CM | POA: Insufficient documentation

## 2023-04-24 MED ORDER — METOPROLOL SUCCINATE ER 25 MG PO TB24: 25.0000 mg | ORAL_TABLET | Freq: Every day | ORAL | 3 refills | Status: AC

## 2023-04-24 MED ORDER — TAMSULOSIN HCL 0.4 MG PO CAPS: 0.4000 mg | ORAL_CAPSULE | Freq: Every day | ORAL | 3 refills | Status: DC

## 2023-04-24 MED ORDER — ROSUVASTATIN CALCIUM 40 MG PO TABS: 40.0000 mg | ORAL_TABLET | Freq: Every day | ORAL | 3 refills | Status: AC

## 2023-04-24 NOTE — Progress Notes (Signed)
 Complete physical exam  Patient: Austin Cox   DOB: October 09, 1945   78 y.o. Male  MRN: [id number]  Subjective:    Chief Complaint  Patient presents with   Annual Exam    Austin Cox is a 78 y.o. male who presents today for a complete physical exam. He reports consuming a general diet. The patient does not participate in regular exercise at present. He generally feels fairly well. He reports sleeping well. He does not have additional problems to discuss today.    History of Present Illness   Austin Cox is an 78 year old male with knee pain and thoracic aortic aneurysm who presents for follow-up and treatment approval. He is accompanied by Rosaline, his caregiver.  He is experiencing worsening knee pain, which is making it increasingly difficult for him to move around.  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 He is under the care of Dr. Gretta, who monitors the aneurysm with scans approximately every six months. A CT surgeon is not currently involved unless surgical intervention becomes necessary.  He sees a rheumatologist every six months, although there was a recent delay due to the rheumatologist's travel. He continues to take Enbrel and methotrexate  for his condition, despite insurance challenges. He also takes gabapentin , metoprolol , and Crestor , with the latter two needing refills.  He experiences frequent nighttime urination, which disrupts his sleep. He has previously tried tamsulosin  without success and is currently taking saw palmetto. No changes in bowel movements.  He had a recent episode of a burst blood vessel in his eye, which has mostly resolved. No associated coughing or straining. He has a history of a retinal operation.  He has gained some weight recently, which he attributes to eating too much chocolate.       Most recent fall risk assessment:    04/24/2023    8:57 AM  Fall Risk   Falls in the past year? 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   Failure to thrive (child) 03/30/2022   Generalized weakness 03/30/2022   COVID-19 03/29/2022   Failure to thrive in adult 03/29/2022   Thoracic aortic aneurysm without rupture (HCC) 02/11/2022   AAA (abdominal aortic aneurysm) (HCC) 11/14/2021   Cerebrovascular small vessel disease 09/18/2021   Gait instability 09/18/2021   Asymptomatic carotid artery stenosis without infarction, left 04/15/2021   Carotid artery stenosis 04/09/2021   Chronic pain syndrome 03/19/2021   Hard of hearing 03/19/2021   Hypertensive retinopathy of both eyes 11/15/2019   Pseudophakia of both eyes 11/15/2019   Spondylosis without myelopathy or radiculopathy, lumbar region 10/24/2018   Rheumatoid arthritis involving multiple sites with positive rheumatoid factor (HCC) 10/24/2018   Chronic myofascial pain 10/24/2018   DDD (degenerative disc disease), cervical 10/24/2018   Cervical spondylosis without myelopathy 10/24/2018   Thrombocytopenia (HCC) 06/02/2017   Vitamin D  deficiency 02/17/2017   Combined forms of age-related cataract of right eye 08/28/2016   Gastroesophageal reflux disease without esophagitis 12/18/2015  Chronic bilateral low back pain without sciatica 12/18/2015   Benign prostatic hyperplasia 12/18/2015   Pure hypercholesterolemia 10/17/2015   S/P coronary artery stent placement 12/21/2014   Coronary artery disease involving native coronary artery of native heart without angina pectoris 12/21/2014   Essential (primary) hypertension 10/11/2013   Nerve root pain 08/18/2013   Past Medical  History:  Diagnosis Date   AAA (abdominal aortic aneurysm) (HCC)    CAD    Carotid stenosis    Coronary artery disease    2 stents   Hypertension    RA (rheumatoid arthritis) (HCC)    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   Past Surgical History:  Procedure Laterality Date   CATARACT EXTRACTION     ENDARTERECTOMY Left 04/15/2021   Procedure: LEFT CAROTID ENDARTERECTOMY WITH;  Surgeon: Gretta Lonni PARAS, MD;  Location: [facility] OR;  Service: Vascular;  Laterality: Left;   EYE SURGERY     detached retina right eye   FRACTURE SURGERY     right leg   Stent in heart     Social History   Tobacco Use   Smoking status: Former    Current packs/day: 0.00    Average packs/day: 0.2 packs/day for 10.0 years (2.0 ttl pk-yrs)    Types: Cigarettes    Start date: 07/05/1964    Quit date: 07/06/1974    Years since quitting: 48.8   Smokeless tobacco: Never  Vaping Use   Vaping status: Never Used  Substance Use Topics   Alcohol  use: Not Currently    Alcohol /week: 1.0 standard drink of alcohol     Types: 1 Cans of beer per week   Drug use: Never   Social History   Socioeconomic History   Marital status: Married    Spouse name: Not on file   Number of children: 3   Years of education: Not on file   Highest education level: Not on file  Occupational History   Occupation: retired  Tobacco Use   Smoking status: Former    Current packs/day: 0.00    Average packs/day: 0.2 packs/day for 10.0 years (2.0 ttl pk-yrs)    Types: Cigarettes    Start date: 07/05/1964    Quit date: 07/06/1974    Years since quitting: 48.8   Smokeless tobacco: Never  Vaping Use   Vaping status: Never Used  Substance and Sexual Activity   Alcohol  use: Not Currently    Alcohol /week: 1.0 standard drink of alcohol     Types: 1 Cans of beer per week   Drug use: Never   Sexual activity: Not on file  Other Topics Concern   Not on file  Social History Narrative   3 children - one passed away   Son in  [place]   They raised 2 teenage grandchildren after their daughter and son in law both passed away    Social Drivers of Health   Financial Resource Strain: Low Risk  (05/22/2022)   Received from [employer], [facility]   Overall Financial Resource Strain (CARDIA)    Difficulty of Paying Living Expenses: Not hard at all  Recent Concern: Physicist, Medical Strain - High Risk (04/08/2022)   Overall Financial Resource Strain (CARDIA)    Difficulty of Paying Living Expenses: Hard  Food Insecurity: No Food Insecurity (05/22/2022)   Received from [facility], [facility]   Hunger Vital Sign    Worried About Running Out of Food in the Last Year: Never true    Ran Out of Food in the  Last Year: Never true  Transportation Needs: No Transportation Needs (05/22/2022)   Received from [facility], [facility]   [facility] - Transportation    Lack of Transportation (Medical): No    Lack of Transportation (Non-Medical): No  Physical Activity: Inactive (04/08/2022)   Exercise Vital Sign    Days of Exercise per Week: 0 days    Minutes of Exercise per Session: 0 min  Stress: No Stress Concern Present (04/08/2022)   [employer] of Occupational Health - Occupational Stress Questionnaire    Feeling of Stress : Not at all  Social Connections: Moderately Isolated (04/08/2022)   Social Connection and Isolation Panel [NHANES]    Frequency of Communication with Friends and Family: More than three times a week    Frequency of Social Gatherings with Friends and Family: More than three times a week    Attends Religious Services: Never    Database Administrator or Organizations: No    Attends Banker Meetings: Never    Marital Status: Married  Catering Manager Violence: Not At Risk (04/08/2022)   Humiliation, Afraid, Rape, and Kick questionnaire    Fear of Current or Ex-Partner: No    Emotionally Abused: No    Physically Abused: No    Sexually Abused: No   Family Status  Relation  Name Status   Mother  Deceased   Father  Deceased  No partnership data on file   History reviewed. No pertinent family history. No Known Allergies    Patient Care Team: Severa Rock HERO, FNP as PCP - General (Family Medicine) Rogers Hai, MD as Medical Oncologist (Hematology)   Outpatient Medications Prior to Visit  Medication Sig   acetaminophen  (TYLENOL ) 650 MG CR tablet Take 650 mg by mouth every 8 (eight) hours as needed for pain.   aspirin  81 MG EC tablet Take 81 mg by mouth daily.   baclofen  (LIORESAL ) 10 MG tablet Take 10 mg by mouth 2 (two) times daily.   Cholecalciferol  25 MCG (1000 UT) capsule Take 1 capsule (1,000 Units total) by mouth daily.   clotrimazole  (LOTRIMIN ) 1 % cream APPLY TO AFFECTED AREA TWICE A DAY   diclofenac Sodium (VOLTAREN) 1 % GEL PLACE ONTO THE SKIN 4 (FOUR) TIMES A DAY AS NEEDED FOR UP TO 30 DAYS.   etanercept (ENBREL SURECLICK) 50 MG/ML injection Inject 50 mg into the skin every Friday.   folic acid  (FOLVITE ) 1 MG tablet Take 1 mg by mouth daily.   gabapentin  (NEURONTIN ) 300 MG capsule Take 300 mg by mouth 2 (two) times daily.   methotrexate  (RHEUMATREX) 2.5 MG tablet Take 4 mg by mouth every Tuesday.   nitroGLYCERIN  (NITROSTAT ) 0.3 MG SL tablet Place 1 tablet (0.3 mg total) under the tongue every 5 (five) minutes as needed.   Saw Palmetto 450-15 MG CAPS Take 2 tablets by mouth daily.   Tetrahydrozoline HCl (VISINE OP) Place 1 drop into both eyes daily as needed (dry eyes).   [DISCONTINUED] metoprolol  succinate (TOPROL -XL) 25 MG 24 hr tablet Take 1 tablet (25 mg total) by mouth daily.   [DISCONTINUED] rosuvastatin  (CRESTOR ) 40 MG tablet TAKE 1 TABLET BY MOUTH EVERY DAY   [DISCONTINUED] triamcinolone  cream (KENALOG ) 0.1 % APPLY TO AFFECTED AREA TWICE A DAY   No facility-administered medications prior to visit.    ROS per HPI     Objective:     BP 113/76   Pulse 68   Temp 97.8 F (36.6 C)   Ht 6' 1 (1.854 m)  Wt 206 lb 6.4 oz  (93.6 kg)   SpO2 98%   BMI 27.23 kg/m  BP Readings from Last 3 Encounters:  04/24/23 113/76  02/10/23 125/79  12/18/22 110/78   Wt Readings from Last 3 Encounters:  04/24/23 206 lb 6.4 oz (93.6 kg)  02/10/23 195 lb 1.6 oz (88.5 kg)  11/07/22 200 lb (90.7 kg)   SpO2 Readings from Last 3 Encounters:  04/24/23 98%  02/10/23 95%  12/18/22 98%      Physical Exam Vitals and nursing note reviewed.  Constitutional:      Appearance: Normal appearance. He is well-developed, well-groomed and overweight.  HENT:     Head: Normocephalic and atraumatic.     Right Ear: Tympanic membrane, ear canal and external ear normal.     Left Ear: Tympanic membrane, ear canal and external ear normal.     Nose: Nose normal.     Mouth/Throat:     Mouth: Mucous membranes are moist.     Pharynx: Oropharynx is clear.  Eyes:     Conjunctiva/sclera: Conjunctivae normal.     Comments: Pupils unequal, normal for pt.   Neck:     Vascular: Carotid bruit present.  Cardiovascular:     Rate and Rhythm: Normal rate and regular rhythm.     Comments: Bounding apical pulse Pulmonary:     Effort: Pulmonary effort is normal.     Breath sounds: Normal breath sounds.  Abdominal:     General: Bowel sounds are normal. There is no distension.     Palpations: Abdomen is soft.     Tenderness: There is no abdominal tenderness.  Musculoskeletal:     Cervical back: Normal range of motion and neck supple.     Right knee: Decreased range of motion.     Left knee: Decreased range of motion.     Right lower leg: No edema.     Left lower leg: No edema.  Skin:    General: Skin is warm and dry.     Capillary Refill: Capillary refill takes less than 2 seconds.  Neurological:     General: No focal deficit present.     Mental Status: He is alert and oriented to person, place, and time.     Gait: Gait abnormal (antalgic, slow, using walking stick).  Psychiatric:        Mood and Affect: Mood normal.        Behavior:  Behavior normal. Behavior is cooperative.        Thought Content: Thought content normal.        Judgment: Judgment normal.       Last CBC Lab Results  Component Value Date   WBC 5.9 12/16/2022   HGB 11.5 (L) 12/16/2022   HCT 36.4 (L) 12/16/2022   MCV 81.3 12/16/2022   MCH 25.7 (L) 12/16/2022   RDW 18.1 (H) 12/16/2022   PLT 76 (L) 12/16/2022   Last metabolic panel Lab Results  Component Value Date   GLUCOSE 96 12/16/2022   NA 134 (L) 12/16/2022   K 3.6 12/16/2022   CL 101 12/16/2022   CO2 25 12/16/2022   BUN 14 12/16/2022   CREATININE 1.06 12/16/2022   GFRNONAA >60 12/16/2022   CALCIUM  9.1 12/16/2022   PROT 6.7 12/16/2022   ALBUMIN  3.5 12/16/2022   LABGLOB 2.7 10/22/2022   AGRATIO 1.3 04/09/2022   BILITOT 0.9 12/16/2022   ALKPHOS 48 12/16/2022   AST 17 12/16/2022   ALT 17 12/16/2022   ANIONGAP 8  12/16/2022   Last lipids Lab Results  Component Value Date   CHOL 79 (L) 10/22/2022   HDL 24 (L) 10/22/2022   LDLCALC 38 10/22/2022   TRIG 81 10/22/2022   CHOLHDL 3.3 10/22/2022   Last hemoglobin A1c Lab Results  Component Value Date   HGBA1C 5.7 (H) 10/22/2022   Last thyroid  functions Lab Results  Component Value Date   TSH 2.700 10/22/2022   T4TOTAL 6.9 10/22/2022   Last vitamin D  Lab Results  Component Value Date   VD25OH 98.3 10/22/2022   Last vitamin B12 and Folate Lab Results  Component Value Date   VITAMINB12 534 03/29/2022   FOLATE 20.1 03/29/2022        Assessment & Plan:    Routine Health Maintenance and Physical Exam  Immunization History  Administered Date(s) Administered   Fluad Quad(high Dose 65+) 11/28/2016, 12/26/2020, 12/25/2021   Influenza, High Dose Seasonal PF 11/22/2013, 12/18/2017   Influenza,inj,quad, With Preservative 12/20/2018   Influenza-Unspecified 11/22/2013   PFIZER(Purple Top)SARS-COV-2 Vaccination 05/13/2019, 06/08/2019, 01/04/2020, 07/13/2020   PNEUMOCOCCAL CONJUGATE-20 03/31/2022   Pfizer Covid-19 Vaccine  Bivalent Booster 32yrs & up 12/26/2020   Pneumococcal Conjugate-13 07/19/2020   Td 03/19/2021   Tdap 09/16/2010   Zoster Recombinant(Shingrix) 03/19/2021, 05/20/2021    Health Maintenance  Topic Date Due   Medicare Annual Wellness (AWV)  04/09/2023   COVID-19 Vaccine (6 - 2024-25 season) 05/10/2023 (Originally 11/16/2022)   INFLUENZA VACCINE  06/15/2023 (Originally 10/16/2022)   DTaP/Tdap/Td (3 - Td or Tdap) 03/20/2031   Pneumonia Vaccine 41+ Years old  Completed   Zoster Vaccines- Shingrix  Completed   HPV VACCINES  Aged Out   Hepatitis C Screening  Discontinued    Discussed health benefits of physical activity, and encouraged him to engage in regular exercise appropriate for his age and condition.  Problem List Items Addressed This Visit       Cardiovascular and Mediastinum   Essential (primary) hypertension   Relevant Medications   metoprolol  succinate (TOPROL -XL) 25 MG 24 hr tablet   rosuvastatin  (CRESTOR ) 40 MG tablet   Other Relevant Orders   Lipid panel   Thyroid  Panel With TSH   CBC with Differential/Platelet   CMP14+EGFR   Coronary artery disease involving native coronary artery of native heart without angina pectoris   Relevant Medications   metoprolol  succinate (TOPROL -XL) 25 MG 24 hr tablet   rosuvastatin  (CRESTOR ) 40 MG tablet   Thoracic aortic aneurysm without rupture (HCC)   Relevant Medications   metoprolol  succinate (TOPROL -XL) 25 MG 24 hr tablet   rosuvastatin  (CRESTOR ) 40 MG tablet     Other   Vitamin D  deficiency   Relevant Orders   CMP14+EGFR   VITAMIN D  25 Hydroxy (Vit-D Deficiency, Fractures)   Pure hypercholesterolemia   Relevant Medications   metoprolol  succinate (TOPROL -XL) 25 MG 24 hr tablet   rosuvastatin  (CRESTOR ) 40 MG tablet   Nocturia   Relevant Medications   tamsulosin  (FLOMAX ) 0.4 MG CAPS capsule   Other Relevant Orders   PSA, total and free   Other Visit Diagnoses       Annual physical exam    -  Primary     Assessment and  Plan    Osteoarthritis of the Knees Chronic knee pain due to osteoarthritis, worsening over time, making ambulation difficult. Treatment with Cosentyx gel has been approved by insurance. Discussed benefits of pain relief and improved mobility with Cosentyx gel injections. Explained potential risks including local injection site reactions and need for periodic follow-ups. -  Administer Cosentyx gel injections as scheduled by Dr. Larose in [place].  Rheumatoid Arthritis Chronic rheumatoid arthritis managed with Enbrel and methotrexate . Patient compliant with medications despite insurance challenges. Discussed importance of continued medication adherence to manage symptoms and prevent disease progression. - Continue Enbrel and methotrexate  as prescribed. - Refill methotrexate  when low.  Thoracic Aortic Aneurysm Thoracic aortic aneurysm extending the length of the thoracic aorta. Regular monitoring with CT scans every six months by Dr. Gretta. No current need for surgical intervention as per Dr. Kerrin. Discussed importance of monitoring to prevent complications such as rupture. Explained surgical intervention would be considered if aneurysm size increases or symptoms worsen. - Continue monitoring with CT scans every six months. - Next scan scheduled for April 29.  Benign Prostatic Hyperplasia Nocturia due to benign prostatic hyperplasia. Previous trial of tamsulosin  (Flomax ) was ineffective. Patient currently taking saw palmetto. Discussed potential benefits of retrying Flomax  to reduce nocturia and improve bladder emptying. Explained risk of dizziness due to blood pressure drop and need to get up slowly. - Start Flomax  0.4 mg at night. - Advise patient to get up slowly to avoid dizziness due to potential blood pressure drop. - Check PSA levels.  Hypertension Well-controlled hypertension. Current medications include metoprolol . - Refill metoprolol .  Hyperlipidemia Chronic hyperlipidemia  managed with Crestor . - Refill Crestor .  General Health Maintenance Routine health maintenance including vitamin D , calcium , thyroid , cholesterol, blood count, and electrolytes. - Check vitamin D  levels. - Check calcium  levels. - Check thyroid  function. - Check cholesterol levels. - Check CBC and electrolytes.  Follow-up - Plan for regular six to eight month follow-up unless new issues arise.       Return in about 6 months (around 10/22/2023) for chronic follow up.     Rosaline Bruns, FNP

## 2023-04-24 NOTE — Telephone Encounter (Signed)
  Pharmacy Patient Advocate Encounter  PA approval. Received via fax. Full letter will be uploaded to the media tab. See reason below.

## 2023-04-25 LAB — CMP14+EGFR
ALT: 14 [IU]/L (ref 0–44)
AST: 16 [IU]/L (ref 0–40)
Albumin: 4.1 g/dL (ref 3.8–4.8)
Alkaline Phosphatase: 71 [IU]/L (ref 44–121)
BUN/Creatinine Ratio: 10 (ref 10–24)
BUN: 11 mg/dL (ref 8–27)
Bilirubin Total: 1 mg/dL (ref 0.0–1.2)
CO2: 23 mmol/L (ref 20–29)
Calcium: 9.2 mg/dL (ref 8.6–10.2)
Chloride: 102 mmol/L (ref 96–106)
Creatinine, Ser: 1.09 mg/dL (ref 0.76–1.27)
Globulin, Total: 2.8 g/dL (ref 1.5–4.5)
Glucose: 90 mg/dL (ref 70–99)
Potassium: 3.9 mmol/L (ref 3.5–5.2)
Sodium: 138 mmol/L (ref 134–144)
Total Protein: 6.9 g/dL (ref 6.0–8.5)
eGFR: 69 mL/min/{1.73_m2} (ref >59)

## 2023-04-25 LAB — CBC WITH DIFFERENTIAL/PLATELET
Basophils Absolute: 0 10*3/uL (ref 0.0–0.2)
Basos: 0 %
EOS (ABSOLUTE): 0 10*3/uL (ref 0.0–0.4)
Eos: 0 %
Hematocrit: 39.3 % (ref 37.5–51.0)
Hemoglobin: 12.1 g/dL — ABNORMAL LOW (ref 13.0–17.7)
Immature Grans (Abs): 0.1 10*3/uL (ref 0.0–0.1)
Immature Granulocytes: 2 %
Lymphocytes Absolute: 1.1 10*3/uL (ref 0.7–3.1)
Lymphs: 17 %
MCH: 25.1 pg — ABNORMAL LOW (ref 26.6–33.0)
MCHC: 30.8 g/dL — ABNORMAL LOW (ref 31.5–35.7)
MCV: 81 fL (ref 79–97)
Monocytes Absolute: 2.8 10*3/uL — ABNORMAL HIGH (ref 0.1–0.9)
Monocytes: 46 %
Neutrophils Absolute: 2.2 10*3/uL (ref 1.4–7.0)
Neutrophils: 35 %
Platelets: 58 10*3/uL — CL (ref 150–450)
RBC: 4.83 x10E6/uL (ref 4.14–5.80)
RDW: 17 % — ABNORMAL HIGH (ref 11.6–15.4)
WBC: 6.2 10*3/uL (ref 3.4–10.8)

## 2023-04-25 LAB — LIPID PANEL
Chol/HDL Ratio: 2.7 {ratio} (ref 0.0–5.0)
Cholesterol, Total: 75 mg/dL — ABNORMAL LOW (ref 100–199)
HDL: 28 mg/dL — ABNORMAL LOW (ref >39)
LDL Chol Calc (NIH): 32 mg/dL (ref 0–99)
Triglycerides: 62 mg/dL (ref 0–149)
VLDL Cholesterol Cal: 15 mg/dL (ref 5–40)

## 2023-04-25 LAB — PSA, TOTAL AND FREE
PSA, Free Pct: 29 %
PSA, Free: 0.87 ng/mL
Prostate Specific Ag, Serum: 3 ng/mL (ref 0.0–4.0)

## 2023-04-25 LAB — THYROID PANEL WITH TSH
Free Thyroxine Index: 2.3 (ref 1.2–4.9)
T3 Uptake Ratio: 30 % (ref 24–39)
T4, Total: 7.6 ug/dL (ref 4.5–12.0)
TSH: 2.52 u[IU]/mL (ref 0.450–4.500)

## 2023-04-25 LAB — VITAMIN D 25 HYDROXY (VIT D DEFICIENCY, FRACTURES): Vit D, 25-Hydroxy: 87.5 ng/mL (ref 30.0–100.0)

## 2023-04-27 ENCOUNTER — Other Ambulatory Visit (HOSPITAL_COMMUNITY): Payer: Self-pay

## 2023-04-27 ENCOUNTER — Telehealth: Payer: Self-pay | Admitting: *Deleted

## 2023-04-27 NOTE — Telephone Encounter (Signed)
 Called to reschedule appointment with Austin Cooler, NP, as requested labs were not drawn @ [facility] 04/24/23 with PCP labs.  Requests were put in per myself under [facility] as the performing facility so that they could be visualized by [facility] lab, however were not done.  Wife does not want to reschedule appointment at this time.

## 2023-04-28 ENCOUNTER — Other Ambulatory Visit: Payer: No Typology Code available for payment source

## 2023-04-29 ENCOUNTER — Telehealth: Payer: Self-pay

## 2023-04-29 NOTE — Telephone Encounter (Signed)
Copied from CRM [id number]. Topic: Clinical - Lab/Test Results >> Apr 29, 2023  1:43 PM Patsy Lager T wrote: Reason for CRM: Gave lab results to wife Ferdie Bakken who request to have nurse call her to make sure the right test were ordered for patient

## 2023-04-30 ENCOUNTER — Ambulatory Visit: Payer: No Typology Code available for payment source | Admitting: Oncology

## 2023-04-30 NOTE — Telephone Encounter (Signed)
Returned call - wife states this has been taken care of

## 2023-05-01 ENCOUNTER — Other Ambulatory Visit: Payer: Self-pay

## 2023-05-01 ENCOUNTER — Inpatient Hospital Stay: Payer: No Typology Code available for payment source | Attending: Physician Assistant

## 2023-05-01 DIAGNOSIS — Z808 Family history of malignant neoplasm of other organs or systems: Secondary | ICD-10-CM | POA: Insufficient documentation

## 2023-05-01 DIAGNOSIS — D696 Thrombocytopenia, unspecified: Secondary | ICD-10-CM

## 2023-05-01 DIAGNOSIS — Z87891 Personal history of nicotine dependence: Secondary | ICD-10-CM | POA: Insufficient documentation

## 2023-05-01 DIAGNOSIS — Z803 Family history of malignant neoplasm of breast: Secondary | ICD-10-CM | POA: Insufficient documentation

## 2023-05-01 DIAGNOSIS — I6529 Occlusion and stenosis of unspecified carotid artery: Secondary | ICD-10-CM | POA: Diagnosis not present

## 2023-05-01 DIAGNOSIS — R5383 Other fatigue: Secondary | ICD-10-CM

## 2023-05-01 DIAGNOSIS — R233 Spontaneous ecchymoses: Secondary | ICD-10-CM | POA: Diagnosis not present

## 2023-05-01 DIAGNOSIS — Z7952 Long term (current) use of systemic steroids: Secondary | ICD-10-CM | POA: Insufficient documentation

## 2023-05-01 DIAGNOSIS — I10 Essential (primary) hypertension: Secondary | ICD-10-CM | POA: Diagnosis not present

## 2023-05-01 DIAGNOSIS — D649 Anemia, unspecified: Secondary | ICD-10-CM | POA: Diagnosis not present

## 2023-05-01 DIAGNOSIS — Z807 Family history of other malignant neoplasms of lymphoid, hematopoietic and related tissues: Secondary | ICD-10-CM | POA: Diagnosis not present

## 2023-05-01 DIAGNOSIS — D61818 Other pancytopenia: Secondary | ICD-10-CM | POA: Diagnosis not present

## 2023-05-01 DIAGNOSIS — Z79899 Other long term (current) drug therapy: Secondary | ICD-10-CM | POA: Insufficient documentation

## 2023-05-01 DIAGNOSIS — E785 Hyperlipidemia, unspecified: Secondary | ICD-10-CM | POA: Insufficient documentation

## 2023-05-01 DIAGNOSIS — M069 Rheumatoid arthritis, unspecified: Secondary | ICD-10-CM | POA: Insufficient documentation

## 2023-05-01 DIAGNOSIS — I2510 Atherosclerotic heart disease of native coronary artery without angina pectoris: Secondary | ICD-10-CM | POA: Insufficient documentation

## 2023-05-01 LAB — IRON AND TIBC
Iron: 40 ug/dL — ABNORMAL LOW (ref 45–182)
Saturation Ratios: 12 % — ABNORMAL LOW (ref 17.9–39.5)
TIBC: 348 ug/dL (ref 250–450)
UIBC: 308 ug/dL

## 2023-05-01 LAB — LACTATE DEHYDROGENASE: LDH: 140 U/L (ref 98–192)

## 2023-05-01 LAB — FOLATE: Folate: 37.2 ng/mL (ref >5.9)

## 2023-05-01 LAB — CBC WITH DIFFERENTIAL/PLATELET
Abs Immature Granulocytes: 0.08 10*3/uL — ABNORMAL HIGH (ref 0.00–0.07)
Basophils Absolute: 0 10*3/uL (ref 0.0–0.1)
Basophils Relative: 0 %
Eosinophils Absolute: 0 10*3/uL (ref 0.0–0.5)
Eosinophils Relative: 1 %
HCT: 38.9 % — ABNORMAL LOW (ref 39.0–52.0)
Hemoglobin: 12.1 g/dL — ABNORMAL LOW (ref 13.0–17.0)
Immature Granulocytes: 2 %
Lymphocytes Relative: 19 %
Lymphs Abs: 1 10*3/uL (ref 0.7–4.0)
MCH: 25.5 pg — ABNORMAL LOW (ref 26.0–34.0)
MCHC: 31.1 g/dL (ref 30.0–36.0)
MCV: 82.1 fL (ref 80.0–100.0)
Monocytes Absolute: 2.5 10*3/uL — ABNORMAL HIGH (ref 0.1–1.0)
Monocytes Relative: 46 %
Neutro Abs: 1.7 10*3/uL (ref 1.7–7.7)
Neutrophils Relative %: 32 %
Platelets: 59 10*3/uL — ABNORMAL LOW (ref 150–400)
RBC: 4.74 MIL/uL (ref 4.22–5.81)
RDW: 18.3 % — ABNORMAL HIGH (ref 11.5–15.5)
Smear Review: DECREASED
WBC: 5.4 10*3/uL (ref 4.0–10.5)
nRBC: 0 % (ref 0.0–0.2)

## 2023-05-01 LAB — COMPREHENSIVE METABOLIC PANEL
ALT: 17 U/L (ref 0–44)
AST: 19 U/L (ref 15–41)
Albumin: 3.7 g/dL (ref 3.5–5.0)
Alkaline Phosphatase: 53 U/L (ref 38–126)
Anion gap: 6 (ref 5–15)
BUN: 19 mg/dL (ref 8–23)
CO2: 26 mmol/L (ref 22–32)
Calcium: 9.4 mg/dL (ref 8.9–10.3)
Chloride: 102 mmol/L (ref 98–111)
Creatinine, Ser: 1.21 mg/dL (ref 0.61–1.24)
GFR, Estimated: >60 mL/min (ref >60)
Glucose, Bld: 81 mg/dL (ref 70–99)
Potassium: 4 mmol/L (ref 3.5–5.1)
Sodium: 134 mmol/L — ABNORMAL LOW (ref 135–145)
Total Bilirubin: 1 mg/dL (ref 0.0–1.2)
Total Protein: 7.2 g/dL (ref 6.5–8.1)

## 2023-05-01 LAB — FERRITIN: Ferritin: 31 ng/mL (ref 24–336)

## 2023-05-01 LAB — VITAMIN D 25 HYDROXY (VIT D DEFICIENCY, FRACTURES): Vit D, 25-Hydroxy: 93.26 ng/mL (ref 30–100)

## 2023-05-01 LAB — VITAMIN B12: Vitamin B-12: 684 pg/mL (ref 180–914)

## 2023-05-01 NOTE — Progress Notes (Unsigned)
[place] [address], [place] [address]   CLINIC:  Medical Oncology/Hematology  PCP:  Sonny Masters, FNP [address] [place] [address] [phone]   REASON FOR VISIT:  Follow-up for pancytopenia  PRIOR THERAPY: None  CURRENT THERAPY: Surveillance  INTERVAL HISTORY:   [name] 78 y.o. male returns for routine follow-up of thrombocytopenia.  He was last seen by NP Durenda Hurt on 12/18/2022.  At today's visit, he reports feeling fair.  He denies any hospitalizations, surgeries, new diagnoses, or changes in baseline health status since his last visit.    He denies any B symptoms.  Chronic fatigue is at baseline.   He has easy bruising, takes aspirin 81 mg daily.  He denies any major bleeding events, recurrent infections, masses, or lymphadenopathy.  He is currently on methotrexate 4 tablets weekly, as well as Enbrel.  He denies any changes in bowel habits.  He has 75% energy and 100% appetite. He endorses that he is maintaining a stable weight.  ASSESSMENT & PLAN:  1.  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  Limited data prior to 2016 with normal platelets noted in 2009. - For the most part, thrombocytopenia from 2016-2022 has been mild, with platelets 100-150. - Downtrending platelets since May 2022 with platelets ranging between 70-90 - Diagnosed with rheumatoid arthritis around 2018 - Has been on methotrexate since about 2019, recently had methotrexate dose decreased to 5 tablets weekly on 08/05/2021.  Reduced to 4 tablets weekly as of 10/03/2021. - He has been on Enbrel since about 2020 - Hematology work-up (09/20/2021): Flow cytometry: No monoclonal B-cell population or significant T-cell abnormalities identified.  (Negative for LGL leukemia, which can be seen in rheumatoid arthritis) Negative hepatitis C  and hepatitis B Normal copper, folate, B12, methylmalonic acid SPEP negative.  Mildly elevated kappa free light chain 32.7 with normal lambda free light chain and light chain ratio.  Normal LDH. Reticulocyte 0.9% - Abdominal ultrasound (10/23/2021): Splenomegaly measuring 14.9 cm diameter, normal-appearing liver. - Bone marrow biopsy (12/12/2021): Hypercellular bone marrow with trilineage hematopoiesis.  Adequate number of megakaryocytes.  Mild myeloid changes present with no increase in blastic cells.  Overall features nonspecific/nondiagnostic, likely secondary in nature due to patient's known history of rheumatoid arthritis and methotrexate treatment, as well as possibly due to hypersplenism.  No evidence of lymphoproliferative process. - Cytogenetics (12/12/2021): 45,X,-Y[20].  Loss of Y chromosome noted, which is nonspecific; can happen in older men, but also can be seen in some hematologic malignancies and MDS in setting of clinical correlation. - PET scan (01/09/2022): Mild splenomegaly without any abnormal hypermetabolic activity.  Findings are NOT suggestive of lymphoma.  No hypermetabolic adenopathy on PET scan.  Additional findings discussed below. - Patient had severe thrombocytopenia during COVID-19 infection in January 2024, with platelets as low as 18,000 on 03/29/2022, but recovered to 32,000 at time of discharge (03/31/2022). - Most recent CBC (05/01/2023): Platelets 59, trending somewhat downward over the past 4 months.  Normal WBC 5.4, but monocytes elevated at 2.5  Normal B12 (684).  Normal folate.  PENDING MMA and copper  Normal LDH.   - Reports easy bruising.  Denies any bleeding, B symptoms, recurrent infections, or lymphadenopathy. - DIFFERENTIAL DIAGNOSIS: Suspect benign splenomegaly in the setting of rheumatoid arthritis, which is likely contributing to thrombocytopenia.  Patient might also have some underlying immune mediated thrombocytopenia  in the setting of his RA.  He may also  have medication-induced thrombocytopenia related to methotrexate (occurs in 3% to 10% of patients with RA receiving MTX)  - PLAN: Will check repeat CT abdomen/pelvis for follow-up of splenomegaly (in addition to other indications noted below). - Due to downtrending platelets, recommend close follow-up with repeat labs and RTC in 2 months.  We will also check immature platelet fraction. - If platelets drop to <50, we will pursue trial of oral steroids. - Would also consider reaching out to rheumatology for adjustment of methotrexate dose. - No indication for repeat BMBx (nonspecific findings on 12/12/2021) at this time, per discussion with Dr. Ellin Saba on 05/04/2023.  2.  Anemia, mild and chronic - Baseline hemoglobin around 12 since at least January 2023 - Denies any major bleeding events.   - SPEP negative, as above. - Most recent labs (05/01/2023): Hgb 12.1/MCV 82.1 Ferritin 31, iron saturation 12% Normal B12 (684).  Normal folate.  PENDING MMA and copper  Normal LDH.  Normal creatinine 1.21/GFR >60. - PLAN: Differential diagnosis favors combination of iron deficiency anemia as well as anemia of chronic disease. - Start taking iron tablet daily w/ stool softener - GI referral due to iron deficiency anemia as well as abnormal appearance of colon on PET scan (see below)   3.  Monocytosis - Persistent monocytosis since July 2023 with monocytes 1.1 (09/20/2021), 2.2 (12/12/2021), and 2.2 (06/18/2022). - Most recent CBC/D (05/01/2023) with elevated monocytes 2.5. - Bone marrow biopsy (12/12/2021): Hypercellular bone marrow with trilineage hematopoiesis.  Adequate number of megakaryocytes.  Mild myeloid changes present with no increase in blastic cells.  Overall features nonspecific/nondiagnostic, likely secondary in nature due to patient's known history of rheumatoid arthritis and methotrexate treatment, as well as possibly due to hypersplenism.  No evidence of lymphoproliferative process. - No B  symptoms - No palpable lymphadenopathy on exam  - PLAN: Persistent monocytosis may be reactive from RA.  Would consider repeat bone marrow biopsy if any major changes from baseline (nonspecific changes on BMBx from 12/12/2021).  No indication for repeat BMBx at this time, per discussion with Dr. Ellin Saba on 05/04/2023.     4.   INCIDENTAL PET SCAN FINDING: Abnormal appearance of colon on PET scan - PET scan (obtained 01/09/2022 due to unexplained splenomegaly/thrombocytopenia) showed focus intense focal metabolic activity in the cecum at the level of the orifice of the appendix - Patient denies any bright blood per rectum, melena, or changes in bowel habits.  No unintentional weight loss. - Last colonoscopy was about 10 years ago, per patient - Urgent referral was made to GI, but new patient appointment was canceled by patient and not rescheduled despite instructing patient/family to do so on multiple occasions. - PLAN: Extensive discussion with patient/patient's wife regarding importance of GI follow-up.  New referral has been sent to GI.  Patient/family verbalized understanding and will reach out to GI today to reschedule his new patient appointment. - We will also check repeat CT abdomen/pelvis with contrast.   5.   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 - Abdominal ultrasound (10/23/2021) with incidental finding of aneurysmal dilation of abdominal aorta with proximal abdominal aorta measuring 5.1 cm in diameter - PLAN: Continue follow-up with Cardiothoracic Surgery (Dr. Dorris Fetch) and Vascular Surgery (Dr. Chestine Spore)   6.  Rheumatoid arthritis - Predominantly involves knees and ankles  for the past 4 to 5 years.  Seen by [facility] Talbert Cage at [facility]. - He has been on Enbrel and methotrexate for 2 to 3 years -  Methotrexate dose decreased to 4 tablets weekly on 10/03/2021   7.  Other history - PMH: Rheumatoid arthritis, hypertension, hyperlipidemia, carotid artery stenosis, small vessel cerebrovascular disease, coronary artery disease - Walks with the help of cane/walker secondary to rheumatoid arthritis. 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  Exposure to gasoline and diesel present.  Quit smoking 45 years ago. - No family history of low platelets.  Sister died of metastatic breast cancer.  Daughter died of non-Hodgkin's lymphoma.  Mother had head and neck cancer.   PLAN SUMMARY: >> CT abdomen/pelvis within the next 1 to 2 months (first available) >> New referral entered for GI @ [facility] >> Labs in 2 months = CBC/D, ferritin, iron/TIBC, immature platelet fraction  >> OFFICE visit in 2 months     REVIEW OF SYSTEMS:   Review of Systems  Constitutional:  Negative for appetite change, chills, diaphoresis, fatigue, fever and unexpected weight change.  HENT:   Negative for lump/mass and nosebleeds.   Eyes:  Negative for eye problems.  Respiratory:  Negative for cough, hemoptysis and shortness of breath.   Cardiovascular:  Positive for palpitations. Negative for chest pain and leg swelling.  Gastrointestinal:  Negative for abdominal pain, blood in stool, constipation, diarrhea, nausea and vomiting.  Genitourinary:  Positive for difficulty urinating (prostate issues). Negative for hematuria.   Skin: Negative.   Neurological:  Positive for dizziness and numbness. Negative for headaches and light-headedness.  Hematological:  Does not bruise/bleed easily.     PHYSICAL EXAM:  ECOG PERFORMANCE STATUS: 2 - Symptomatic, <50% confined to bed  Vitals:   05/04/23 1258  BP: 132/80  Pulse: 68  Resp: 16  Temp: 98 F (36.7 C)  SpO2: 98%   Filed Weights   05/04/23 1258  Weight: 207 lb 7.3 oz (94.1 kg)   Physical Exam Constitutional:      Appearance: Normal appearance. He  is obese.  Cardiovascular:     Heart sounds: Normal heart sounds.  Pulmonary:     Breath sounds: Normal breath sounds. Decreased air movement present.  Neurological:     General: No focal deficit present.     Mental Status: Mental status is at baseline.  Psychiatric:        Behavior: Behavior normal. Behavior is cooperative.     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   Past Surgical History:  Procedure Laterality Date   CATARACT EXTRACTION     ENDARTERECTOMY Left 04/15/2021   Procedure: LEFT CAROTID ENDARTERECTOMY WITH;  Surgeon: Cephus Shelling, MD;  Location: [facility] OR;  Service: Vascular;  Laterality: Left;   EYE SURGERY     detached retina right eye   FRACTURE SURGERY     right leg   Stent in heart      SOCIAL HISTORY:  Social History   Socioeconomic History   Marital status: Married    Spouse name: Not on file   Number of children: 3   Years of education: Not on file   Highest  education level: Not on file  Occupational History   Occupation: retired  Tobacco Use   Smoking status: Former    Current packs/day: 0.00    Average packs/day: 0.2 packs/day for 10.0 years (2.0 ttl pk-yrs)    Types: Cigarettes    Start date: 07/05/1964    Quit date: 07/06/1974    Years since quitting: 48.8   Smokeless tobacco: Never  Vaping Use   Vaping status: Never Used  Substance and Sexual Activity   Alcohol use: Not Currently    Alcohol/week: 1.0 standard drink of alcohol    Types: 1 Cans of beer per week   Drug use: Never   Sexual activity: Not on file  Other Topics Concern   Not on file  Social History Narrative   3 children - one passed away   Son in [place]   They raised 2 teenage grandchildren after their daughter and son in  law both passed away    Social Drivers of Health   Financial Resource Strain: Low Risk  (05/22/2022)   Received from [employer], [facility]   Overall Financial Resource Strain (CARDIA)    Difficulty of Paying Living Expenses: Not hard at all  Recent Concern: Financial Resource Strain - High Risk (04/08/2022)   Overall Financial Resource Strain (CARDIA)    Difficulty of Paying Living Expenses: Hard  Food Insecurity: No Food Insecurity (05/22/2022)   Received from [facility], [facility]   Hunger Vital Sign    Worried About Running Out of Food in the Last Year: Never true    Ran Out of Food in the Last Year: Never true  Transportation Needs: No Transportation Needs (05/22/2022)   Received from [facility], [facility]   PRAPARE - Transportation    Lack of Transportation (Medical): No    Lack of Transportation (Non-Medical): No  Physical Activity: Inactive (04/08/2022)   Exercise Vital Sign    Days of Exercise per Week: 0 days    Minutes of Exercise per Session: 0 min  Stress: No Stress Concern Present (04/08/2022)   [employer] of Occupational Health - Occupational Stress Questionnaire    Feeling of Stress : Not at all  Social Connections: Moderately Isolated (04/08/2022)   Social Connection and Isolation Panel [NHANES]    Frequency of Communication with Friends and Family: More than three times a week    Frequency of Social Gatherings with Friends and Family: More than three times a week    Attends Religious Services: Never    Database administrator or Organizations: No    Attends Banker Meetings: Never    Marital Status: Married  Catering manager Violence: Not At Risk (04/08/2022)   Humiliation, Afraid, Rape, and Kick questionnaire    Fear of Current or Ex-Partner: No    Emotionally Abused: No    Physically Abused: No    Sexually Abused: No    FAMILY HISTORY:  No family history on file.  CURRENT MEDICATIONS:  Outpatient Encounter Medications as  of 05/04/2023  Medication Sig   acetaminophen (TYLENOL) 650 MG CR tablet Take 650 mg by mouth every 8 (eight) hours as needed for pain.   aspirin 81 MG EC tablet Take 81 mg by mouth daily.   baclofen (LIORESAL) 10 MG tablet Take 10 mg by mouth 2 (two) times daily.   Cholecalciferol 25 MCG (1000 UT) capsule Take 1 capsule (1,000 Units total) by mouth daily.   clotrimazole (LOTRIMIN) 1 % cream APPLY TO AFFECTED AREA TWICE  A DAY   diclofenac Sodium (VOLTAREN) 1 % GEL PLACE ONTO THE SKIN 4 (FOUR) TIMES A DAY AS NEEDED FOR UP TO 30 DAYS.   etanercept (ENBREL SURECLICK) 50 MG/ML injection Inject 50 mg into the skin every Friday.   ferrous sulfate 325 (65 FE) MG EC tablet Take 1 tablet (325 mg total) by mouth daily with breakfast.   folic acid (FOLVITE) 1 MG tablet Take 1 mg by mouth daily.   gabapentin (NEURONTIN) 300 MG capsule Take 300 mg by mouth 2 (two) times daily.   methotrexate (RHEUMATREX) 2.5 MG tablet Take 4 mg by mouth every Tuesday.   metoprolol succinate (TOPROL-XL) 25 MG 24 hr tablet Take 1 tablet (25 mg total) by mouth daily.   nitroGLYCERIN (NITROSTAT) 0.3 MG SL tablet Place 1 tablet (0.3 mg total) under the tongue every 5 (five) minutes as needed.   rosuvastatin (CRESTOR) 40 MG tablet Take 1 tablet (40 mg total) by mouth daily.   Saw Palmetto 450-15 MG CAPS Take 2 tablets by mouth daily.   tamsulosin (FLOMAX) 0.4 MG CAPS capsule Take 1 capsule (0.4 mg total) by mouth at bedtime.   Tetrahydrozoline HCl (VISINE OP) Place 1 drop into both eyes daily as needed (dry eyes).   No facility-administered encounter medications on file as of 05/04/2023.    ALLERGIES:  No Known Allergies  LABORATORY DATA:  I have reviewed the labs as listed.  CBC    Component Value Date/Time   WBC 5.4 05/01/2023 1141   RBC 4.74 05/01/2023 1141   HGB 12.1 (L) 05/01/2023 1141   HGB 12.1 (L) 04/24/2023 0924   HCT 38.9 (L) 05/01/2023 1141   HCT 39.3 04/24/2023 0924   PLT 59 (L) 05/01/2023 1141   PLT 58  (LL) 04/24/2023 0924   MCV 82.1 05/01/2023 1141   MCV 81 04/24/2023 0924   MCH 25.5 (L) 05/01/2023 1141   MCHC 31.1 05/01/2023 1141   RDW 18.3 (H) 05/01/2023 1141   RDW 17.0 (H) 04/24/2023 0924   LYMPHSABS 1.0 05/01/2023 1141   LYMPHSABS 1.1 04/24/2023 0924   MONOABS 2.5 (H) 05/01/2023 1141   EOSABS 0.0 05/01/2023 1141   EOSABS 0.0 04/24/2023 0924   BASOSABS 0.0 05/01/2023 1141   BASOSABS 0.0 04/24/2023 0924      Latest Ref Rng & Units 05/01/2023   11:41 AM 04/24/2023    9:24 AM 12/16/2022   11:17 AM  CMP  Glucose 70 - 99 mg/dL 81  90  96   BUN 8 - 23 mg/dL 19  11  14    Creatinine 0.61 - 1.24 mg/dL 1.61  0.96  0.45   Sodium 135 - 145 mmol/L 134  138  134   Potassium 3.5 - 5.1 mmol/L 4.0  3.9  3.6   Chloride 98 - 111 mmol/L 102  102  101   CO2 22 - 32 mmol/L 26  23  25    Calcium 8.9 - 10.3 mg/dL 9.4  9.2  9.1   Total Protein 6.5 - 8.1 g/dL 7.2  6.9  6.7   Total Bilirubin 0.0 - 1.2 mg/dL 1.0  1.0  0.9   Alkaline Phos 38 - 126 U/L 53  71  48   AST 15 - 41 U/L 19  16  17    ALT 0 - 44 U/L 17  14  17      DIAGNOSTIC IMAGING:  I have independently reviewed the relevant imaging and discussed with the patient.   WRAP UP:  All questions were  answered. The patient knows to call the clinic with any problems, questions or concerns.  Medical decision making: Moderate  Time spent on visit: I spent 20 minutes counseling the patient face to face. The total time spent in the appointment was 30 minutes and more than 50% was on counseling.  Carnella Guadalajara, PA-C  05/04/23 5:25 PM

## 2023-05-04 ENCOUNTER — Inpatient Hospital Stay (HOSPITAL_BASED_OUTPATIENT_CLINIC_OR_DEPARTMENT_OTHER): Payer: No Typology Code available for payment source | Admitting: Physician Assistant

## 2023-05-04 VITALS — BP 132/80 | HR 68 | Temp 98.0°F | Resp 16 | Wt 207.5 lb

## 2023-05-04 DIAGNOSIS — R5383 Other fatigue: Secondary | ICD-10-CM | POA: Diagnosis not present

## 2023-05-04 DIAGNOSIS — D696 Thrombocytopenia, unspecified: Secondary | ICD-10-CM | POA: Diagnosis not present

## 2023-05-04 DIAGNOSIS — D61818 Other pancytopenia: Secondary | ICD-10-CM | POA: Diagnosis not present

## 2023-05-04 DIAGNOSIS — R161 Splenomegaly, not elsewhere classified: Secondary | ICD-10-CM

## 2023-05-04 DIAGNOSIS — R948 Abnormal results of function studies of other organs and systems: Secondary | ICD-10-CM

## 2023-05-04 DIAGNOSIS — D508 Other iron deficiency anemias: Secondary | ICD-10-CM | POA: Diagnosis not present

## 2023-05-04 LAB — METHYLMALONIC ACID, SERUM: Methylmalonic Acid, Quantitative: 241 nmol/L (ref 0–378)

## 2023-05-04 MED ORDER — FERROUS SULFATE 325 (65 FE) MG PO TBEC: 325.0000 mg | DELAYED_RELEASE_TABLET | Freq: Every day | ORAL | 3 refills | Status: AC

## 2023-05-04 NOTE — Patient Instructions (Signed)
[facility] at [facility] **VISIT SUMMARY & IMPORTANT INSTRUCTIONS **   You were seen today by Rojelio Brenner PA-C for your low platelets.    LOW PLATELETS Your low platelets may be caused by your enlarged spleen, or may be caused by your overactive immune system which can also attack your platelets. Your bone marrow biopsy and PET scan did not show any sign of blood cancer or lymphoma. Your enlarged spleen is most likely related to your autoimmune disease (rheumatoid arthritis).  We will check a CT scan to follow-up on the size of your spleen. You do not need any treatment for your low platelets at this time.  We will continue to monitor your platelets closely at follow-up visits.  ABNORMAL FINDING ON PET SCAN: Your PET scan showed an area of abnormality in your colon. We do not yet know what caused this abnormality, but it is very important that we refer you for colonoscopy to make sure you do not have any signs of colon cancer. We will also look at your colon on your upcoming CT scan, which will give [place] more information. Please call [facility] at [phone] to reschedule your new patient appointment.  IRON DEFICIENCY ANEMIA: Your blood and iron levels are both mildly low.  This may be related to small amounts of blood loss from your intestines, which is another reason why it is important for you to see the gastroenterologist we are referring you to. Start taking iron tablet (ferrous sulfate 325 mg) every day along with stool softener.  These are both available over-the-counter.   FOLLOW-UP APPOINTMENT: Office visit in 2 months, with labs checked before  ** Thank you for trusting me with your healthcare!  I strive to provide all of my patients with quality care at each visit.  If you receive a survey for this visit, I would be so grateful to you for taking the time to provide feedback.  Thank you in advance!  ~ Vishal Sandlin                    Dr. Doreatha Massed   &   Rojelio Brenner, PA-C   - - - - - - - - - - - - - - - - - -    Thank you for choosing [facility] at [facility] to provide your oncology and hematology care.  To afford each patient quality time with our provider, please arrive at least 15 minutes before your scheduled appointment time.   If you have a lab appointment with the Cancer Center please come in thru the Main Entrance and check in at the main information desk.  You need to re-schedule your appointment should you arrive 10 or more minutes late.  We strive to give you quality time with our providers, and arriving late affects you and other patients whose appointments are after yours.  Also, if you no show three or more times for appointments you may be dismissed from the clinic at the providers discretion.     Again, thank you for choosing [facility].  Our hope is that these requests will decrease the amount of time that you wait before being seen by our physicians.       _____________________________________________________________  Should you have questions after your visit to [facility], please contact our office at [phone] and follow the prompts.  Our office hours are 8:00 a.m. and 4:30  p.m. Monday - Friday.  Please note that voicemails left after 4:00 p.m. may not be returned until the following business day.  We are closed weekends and major holidays.  You do have access to a nurse 24-7, just call the main number to the clinic [phone] and do not press any options, hold on the line and a nurse will answer the phone.    For prescription refill requests, have your pharmacy contact our office and allow 72 hours.

## 2023-05-05 LAB — COPPER, SERUM: Copper: 98 ug/dL (ref 69–132)

## 2023-05-15 DIAGNOSIS — M47816 Spondylosis without myelopathy or radiculopathy, lumbar region: Secondary | ICD-10-CM | POA: Diagnosis not present

## 2023-05-15 DIAGNOSIS — M0579 Rheumatoid arthritis with rheumatoid factor of multiple sites without organ or systems involvement: Secondary | ICD-10-CM | POA: Diagnosis not present

## 2023-05-15 DIAGNOSIS — R2681 Unsteadiness on feet: Secondary | ICD-10-CM | POA: Diagnosis not present

## 2023-05-18 ENCOUNTER — Encounter: Payer: Self-pay | Admitting: Gastroenterology

## 2023-05-18 NOTE — Progress Notes (Unsigned)
 GI Office Note    Referring Provider: Rojelio Brenner, PA-C Primary Care Physician:  Sonny Masters, FNP  Primary Gastroenterologist:  Chief Complaint   No chief complaint on file.    History of Present Illness   Austin Cox is a 78 y.o. male presenting today for further evaluation of abnormal cecum on PET scan and IDA at the request of Rojelio Brenner, PA-C.   Follows with hematology for thrombocytopenia, mild splenomegaly without signs of lymphoma, anemia.    PET 12/2021 showed focus intense focal metabolic activity in the cecum at the level of the orifice of the appendix. He has had no further imaging of his abd/pelvis since that time. He cancelled GI appointment in 2023 to follow up on this finding.   CT A/P with contrast scheduled 06/25/23.    Medications   Current Outpatient Medications  Medication Sig Dispense Refill   acetaminophen (TYLENOL) 650 MG CR tablet Take 650 mg by mouth every 8 (eight) hours as needed for pain.     aspirin 81 MG EC tablet Take 81 mg by mouth daily.     baclofen (LIORESAL) 10 MG tablet Take 10 mg by mouth 2 (two) times daily.     Cholecalciferol 25 MCG (1000 UT) capsule Take 1 capsule (1,000 Units total) by mouth daily. 90 capsule 2   clotrimazole (LOTRIMIN) 1 % cream APPLY TO AFFECTED AREA TWICE A DAY 60 g 1   diclofenac Sodium (VOLTAREN) 1 % GEL PLACE ONTO THE SKIN 4 (FOUR) TIMES A DAY AS NEEDED FOR UP TO 30 DAYS. 100 g 1   etanercept (ENBREL SURECLICK) 50 MG/ML injection Inject 50 mg into the skin every Friday.     ferrous sulfate 325 (65 FE) MG EC tablet Take 1 tablet (325 mg total) by mouth daily with breakfast. 90 tablet 3   folic acid (FOLVITE) 1 MG tablet Take 1 mg by mouth daily.     gabapentin (NEURONTIN) 300 MG capsule Take 300 mg by mouth 2 (two) times daily.     methotrexate (RHEUMATREX) 2.5 MG tablet Take 4 mg by mouth every Tuesday.     metoprolol succinate (TOPROL-XL) 25 MG 24 hr tablet Take 1 tablet (25 mg total)  by mouth daily. 100 tablet 3   nitroGLYCERIN (NITROSTAT) 0.3 MG SL tablet Place 1 tablet (0.3 mg total) under the tongue every 5 (five) minutes as needed. 90 tablet 0   rosuvastatin (CRESTOR) 40 MG tablet Take 1 tablet (40 mg total) by mouth daily. 90 tablet 3   Saw Palmetto 450-15 MG CAPS Take 2 tablets by mouth daily.     tamsulosin (FLOMAX) 0.4 MG CAPS capsule Take 1 capsule (0.4 mg total) by mouth at bedtime. 90 capsule 3   Tetrahydrozoline HCl (VISINE OP) Place 1 drop into both eyes daily as needed (dry eyes).     No current facility-administered medications for this visit.    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  Past Social History   Social History   Socioeconomic History   Marital status: Married    Spouse name: Not on file   Number of children: 3   Years of education: Not on file   Highest education level: Not on file  Occupational History   Occupation: retired  Tobacco Use   Smoking status: Former    Current packs/day: 0.00    Average packs/day: 0.2 packs/day for 10.0 years (2.0 ttl pk-yrs)    Types: Cigarettes    Start date: 07/05/1964    Quit date: 07/06/1974    Years since quitting: 48.8   Smokeless  tobacco: Never  Vaping Use   Vaping status: Never Used  Substance and Sexual Activity   Alcohol use: Not Currently    Alcohol/week: 1.0 standard drink of alcohol    Types: 1 Cans of beer per week   Drug use: Never   Sexual activity: Not on file  Other Topics Concern   Not on file  Social History Narrative   3 children - one passed away   Son in [place]   They raised 2 teenage grandchildren after their daughter and son in law both passed away    Social Drivers of Health   Financial Resource Strain: Low Risk  (05/22/2022)   Received from [employer], [facility]   Overall Financial Resource Strain (CARDIA)    Difficulty of Paying Living Expenses: Not hard at all  Recent Concern: Financial Resource Strain - High Risk (04/08/2022)   Overall Financial Resource Strain (CARDIA)    Difficulty of Paying Living Expenses: Hard  Food Insecurity: No Food Insecurity (05/22/2022)   Received from [facility], [facility]   Hunger Vital Sign    Worried About Running Out of Food in the Last Year: Never true    Ran Out of Food in the Last Year: Never true  Transportation Needs: No Transportation Needs (05/22/2022)   Received from [facility], [facility]   PRAPARE - Transportation    Lack of Transportation (Medical): No    Lack of Transportation (Non-Medical): No  Physical Activity: Inactive (04/08/2022)   Exercise Vital Sign    Days of Exercise per Week: 0 days    Minutes of Exercise per Session: 0 min  Stress: No Stress Concern Present (04/08/2022)   [employer] of Occupational Health - Occupational Stress Questionnaire    Feeling of Stress : Not at all  Social Connections: Moderately Isolated (04/08/2022)   Social Connection and Isolation Panel [NHANES]    Frequency of Communication with Friends and Family: More than three times a week    Frequency of Social Gatherings with Friends and Family: More than three times a week    Attends Religious Services: Never    Loss adjuster, chartered or Organizations: No    Attends Banker Meetings: Never    Marital Status: Married  Catering manager Violence: Not At Risk (04/08/2022)   Humiliation, Afraid, Rape, and Kick questionnaire    Fear of Current or Ex-Partner: No    Emotionally Abused: No    Physically Abused:  No    Sexually Abused: No    Review of Systems   General: Negative for anorexia, weight loss, fever, chills, fatigue, weakness. Eyes: Negative for vision changes.  ENT: Negative for hoarseness, difficulty swallowing , nasal congestion. CV: Negative for chest pain, angina, palpitations, dyspnea on exertion, peripheral edema.  Respiratory: Negative for dyspnea at rest, dyspnea on exertion, cough, sputum, wheezing.  GI: See history of present illness. GU:  Negative for dysuria, hematuria, urinary incontinence, urinary frequency, nocturnal urination.  MS: Negative for joint pain, low back pain.  Derm: Negative for rash or itching.  Neuro: Negative for weakness, abnormal sensation, seizure, frequent headaches, memory loss,  confusion.  Psych: Negative for anxiety, depression, suicidal ideation, hallucinations.  Endo: Negative for unusual weight change.  Heme: Negative for bruising or bleeding. Allergy: Negative for rash or hives.  Physical Exam   There were no vitals taken for this visit.   General: Well-nourished, well-developed in no acute distress.  Head: Normocephalic, atraumatic.   Eyes: Conjunctiva pink, no icterus. Mouth: Oropharyngeal mucosa moist and pink , no lesions erythema or exudate. Neck: Supple without thyromegaly, masses, or lymphadenopathy.  Lungs: Clear to auscultation bilaterally.  Heart: Regular rate and rhythm, no murmurs rubs or gallops.  Abdomen: Bowel sounds are normal, nontender, nondistended, no hepatosplenomegaly or masses,  no abdominal bruits or hernia, no rebound or guarding.   Rectal: *** Extremities: No lower extremity edema. No clubbing or deformities.   Neuro: Alert and oriented x 4 , grossly normal neurologically.  Skin: Warm and dry, no rash or jaundice.   Psych: Alert and cooperative, normal mood and affect.  Labs   Lab Results  Component Value Date   NA 134 (L) 05/01/2023   CL 102 05/01/2023   K 4.0 05/01/2023   CO2 26 05/01/2023   BUN 19 05/01/2023   CREATININE 1.21 05/01/2023   GFRNONAA >60 05/01/2023   CALCIUM 9.4 05/01/2023   ALBUMIN 3.7 05/01/2023   GLUCOSE 81 05/01/2023   Lab Results  Component Value Date   ALT 17 05/01/2023   AST 19 05/01/2023   ALKPHOS 53 05/01/2023   BILITOT 1.0 05/01/2023   Lab Results  Component Value Date   WBC 5.4 05/01/2023   HGB 12.1 (L) 05/01/2023   HCT 38.9 (L) 05/01/2023   MCV 82.1 05/01/2023   PLT 59 (L) 05/01/2023   Lab Results  Component Value Date   IRON 40 (L) 05/01/2023   TIBC 348 05/01/2023   FERRITIN 31 05/01/2023   Lab Results  Component Value Date   VITAMINB12 684 05/01/2023   Lab Results  Component Value Date   FOLATE 37.2 05/01/2023    Imaging Studies   No results found.  Assessment       PLAN   ***   Leanna Battles. Melvyn Neth, MHS, PA-C [facility]

## 2023-05-19 ENCOUNTER — Ambulatory Visit (INDEPENDENT_AMBULATORY_CARE_PROVIDER_SITE_OTHER): Payer: No Typology Code available for payment source | Admitting: Gastroenterology

## 2023-05-19 ENCOUNTER — Encounter: Payer: Self-pay | Admitting: Gastroenterology

## 2023-05-19 VITALS — BP 131/74 | HR 73 | Temp 97.7°F | Ht 72.0 in | Wt 209.2 lb

## 2023-05-19 DIAGNOSIS — K5900 Constipation, unspecified: Secondary | ICD-10-CM | POA: Insufficient documentation

## 2023-05-19 DIAGNOSIS — R933 Abnormal findings on diagnostic imaging of other parts of digestive tract: Secondary | ICD-10-CM

## 2023-05-19 DIAGNOSIS — D696 Thrombocytopenia, unspecified: Secondary | ICD-10-CM | POA: Diagnosis not present

## 2023-05-19 DIAGNOSIS — R948 Abnormal results of function studies of other organs and systems: Secondary | ICD-10-CM | POA: Insufficient documentation

## 2023-05-19 DIAGNOSIS — M170 Bilateral primary osteoarthritis of knee: Secondary | ICD-10-CM | POA: Diagnosis not present

## 2023-05-19 DIAGNOSIS — R161 Splenomegaly, not elsewhere classified: Secondary | ICD-10-CM | POA: Diagnosis not present

## 2023-05-19 DIAGNOSIS — R262 Difficulty in walking, not elsewhere classified: Secondary | ICD-10-CM | POA: Diagnosis not present

## 2023-05-19 DIAGNOSIS — D509 Iron deficiency anemia, unspecified: Secondary | ICD-10-CM | POA: Insufficient documentation

## 2023-05-19 DIAGNOSIS — M25561 Pain in right knee: Secondary | ICD-10-CM | POA: Diagnosis not present

## 2023-05-19 DIAGNOSIS — M25562 Pain in left knee: Secondary | ICD-10-CM | POA: Diagnosis not present

## 2023-05-19 DIAGNOSIS — D61818 Other pancytopenia: Secondary | ICD-10-CM

## 2023-05-19 NOTE — Patient Instructions (Signed)
 We will be in touch to schedule a colonoscopy once your CT scan results are back. I am also going to see if we can get your CT scan done sooner so as not to delay your care.   It was very nice meeting you today!

## 2023-05-24 ENCOUNTER — Other Ambulatory Visit: Payer: Self-pay | Admitting: Family Medicine

## 2023-05-24 DIAGNOSIS — L2489 Irritant contact dermatitis due to other agents: Secondary | ICD-10-CM

## 2023-05-26 ENCOUNTER — Ambulatory Visit (HOSPITAL_COMMUNITY): Admission: RE | Admit: 2023-05-26 | Source: Ambulatory Visit

## 2023-05-26 DIAGNOSIS — M25562 Pain in left knee: Secondary | ICD-10-CM | POA: Diagnosis not present

## 2023-05-26 DIAGNOSIS — M25561 Pain in right knee: Secondary | ICD-10-CM | POA: Diagnosis not present

## 2023-05-26 DIAGNOSIS — M170 Bilateral primary osteoarthritis of knee: Secondary | ICD-10-CM | POA: Diagnosis not present

## 2023-05-26 DIAGNOSIS — R262 Difficulty in walking, not elsewhere classified: Secondary | ICD-10-CM | POA: Diagnosis not present

## 2023-05-27 ENCOUNTER — Other Ambulatory Visit: Payer: Self-pay | Admitting: Physician Assistant

## 2023-05-27 DIAGNOSIS — R948 Abnormal results of function studies of other organs and systems: Secondary | ICD-10-CM

## 2023-05-27 DIAGNOSIS — R161 Splenomegaly, not elsewhere classified: Secondary | ICD-10-CM

## 2023-06-02 DIAGNOSIS — R262 Difficulty in walking, not elsewhere classified: Secondary | ICD-10-CM | POA: Diagnosis not present

## 2023-06-02 DIAGNOSIS — M170 Bilateral primary osteoarthritis of knee: Secondary | ICD-10-CM | POA: Diagnosis not present

## 2023-06-02 DIAGNOSIS — M25562 Pain in left knee: Secondary | ICD-10-CM | POA: Diagnosis not present

## 2023-06-02 DIAGNOSIS — M25561 Pain in right knee: Secondary | ICD-10-CM | POA: Diagnosis not present

## 2023-06-03 DIAGNOSIS — Z6828 Body mass index (BMI) 28.0-28.9, adult: Secondary | ICD-10-CM | POA: Diagnosis not present

## 2023-06-03 DIAGNOSIS — N182 Chronic kidney disease, stage 2 (mild): Secondary | ICD-10-CM | POA: Diagnosis not present

## 2023-06-03 DIAGNOSIS — D84821 Immunodeficiency due to drugs: Secondary | ICD-10-CM | POA: Diagnosis not present

## 2023-06-03 DIAGNOSIS — Z008 Encounter for other general examination: Secondary | ICD-10-CM | POA: Diagnosis not present

## 2023-06-03 DIAGNOSIS — M0559 Rheumatoid polyneuropathy with rheumatoid arthritis of multiple sites: Secondary | ICD-10-CM | POA: Diagnosis not present

## 2023-06-03 DIAGNOSIS — R2681 Unsteadiness on feet: Secondary | ICD-10-CM | POA: Diagnosis not present

## 2023-06-03 DIAGNOSIS — E785 Hyperlipidemia, unspecified: Secondary | ICD-10-CM | POA: Diagnosis not present

## 2023-06-03 DIAGNOSIS — E663 Overweight: Secondary | ICD-10-CM | POA: Diagnosis not present

## 2023-06-04 ENCOUNTER — Ambulatory Visit (HOSPITAL_COMMUNITY)
Admission: RE | Admit: 2023-06-04 | Discharge: 2023-06-04 | Disposition: A | Discharge status: Home / Self Care | Source: Ambulatory Visit | Attending: Physician Assistant | Admitting: Physician Assistant

## 2023-06-04 DIAGNOSIS — I7143 Infrarenal abdominal aortic aneurysm, without rupture: Secondary | ICD-10-CM | POA: Diagnosis not present

## 2023-06-04 DIAGNOSIS — R161 Splenomegaly, not elsewhere classified: Secondary | ICD-10-CM | POA: Diagnosis not present

## 2023-06-04 DIAGNOSIS — R948 Abnormal results of function studies of other organs and systems: Secondary | ICD-10-CM | POA: Diagnosis not present

## 2023-06-04 DIAGNOSIS — I7123 Aneurysm of the descending thoracic aorta, without rupture: Secondary | ICD-10-CM | POA: Diagnosis not present

## 2023-06-04 MED ORDER — IOHEXOL 300 MG/ML  SOLN
100.0000 mL | Freq: Once | INTRAMUSCULAR | Status: AC | PRN
  Administered 2023-06-04: 100 mL via INTRAVENOUS

## 2023-06-10 DIAGNOSIS — M542 Cervicalgia: Secondary | ICD-10-CM | POA: Diagnosis not present

## 2023-06-10 DIAGNOSIS — D84821 Immunodeficiency due to drugs: Secondary | ICD-10-CM | POA: Diagnosis not present

## 2023-06-10 DIAGNOSIS — Z79899 Other long term (current) drug therapy: Secondary | ICD-10-CM | POA: Diagnosis not present

## 2023-06-10 DIAGNOSIS — G8929 Other chronic pain: Secondary | ICD-10-CM | POA: Diagnosis not present

## 2023-06-10 DIAGNOSIS — M545 Low back pain, unspecified: Secondary | ICD-10-CM | POA: Diagnosis not present

## 2023-06-10 DIAGNOSIS — M0579 Rheumatoid arthritis with rheumatoid factor of multiple sites without organ or systems involvement: Secondary | ICD-10-CM | POA: Diagnosis not present

## 2023-06-15 DIAGNOSIS — M47816 Spondylosis without myelopathy or radiculopathy, lumbar region: Secondary | ICD-10-CM | POA: Diagnosis not present

## 2023-06-15 DIAGNOSIS — M0579 Rheumatoid arthritis with rheumatoid factor of multiple sites without organ or systems involvement: Secondary | ICD-10-CM | POA: Diagnosis not present

## 2023-06-15 DIAGNOSIS — R2681 Unsteadiness on feet: Secondary | ICD-10-CM | POA: Diagnosis not present

## 2023-06-25 ENCOUNTER — Inpatient Hospital Stay: Payer: No Typology Code available for payment source | Attending: Physician Assistant

## 2023-06-25 ENCOUNTER — Other Ambulatory Visit (HOSPITAL_COMMUNITY): Payer: No Typology Code available for payment source

## 2023-06-25 DIAGNOSIS — Z807 Family history of other malignant neoplasms of lymphoid, hematopoietic and related tissues: Secondary | ICD-10-CM | POA: Diagnosis not present

## 2023-06-25 DIAGNOSIS — I714 Abdominal aortic aneurysm, without rupture, unspecified: Secondary | ICD-10-CM | POA: Insufficient documentation

## 2023-06-25 DIAGNOSIS — M069 Rheumatoid arthritis, unspecified: Secondary | ICD-10-CM | POA: Diagnosis not present

## 2023-06-25 DIAGNOSIS — D61818 Other pancytopenia: Secondary | ICD-10-CM | POA: Diagnosis not present

## 2023-06-25 DIAGNOSIS — D72821 Monocytosis (symptomatic): Secondary | ICD-10-CM | POA: Insufficient documentation

## 2023-06-25 DIAGNOSIS — I7123 Aneurysm of the descending thoracic aorta, without rupture: Secondary | ICD-10-CM | POA: Diagnosis not present

## 2023-06-25 DIAGNOSIS — Z803 Family history of malignant neoplasm of breast: Secondary | ICD-10-CM | POA: Insufficient documentation

## 2023-06-25 DIAGNOSIS — D649 Anemia, unspecified: Secondary | ICD-10-CM | POA: Diagnosis not present

## 2023-06-25 DIAGNOSIS — Z87891 Personal history of nicotine dependence: Secondary | ICD-10-CM | POA: Diagnosis not present

## 2023-06-25 LAB — IRON AND TIBC
Iron: 32 ug/dL — ABNORMAL LOW (ref 45–182)
Saturation Ratios: 11 % — ABNORMAL LOW (ref 17.9–39.5)
TIBC: 295 ug/dL (ref 250–450)
UIBC: 263 ug/dL

## 2023-06-25 LAB — CBC WITH DIFFERENTIAL/PLATELET
Abs Immature Granulocytes: 0.1 10*3/uL — ABNORMAL HIGH (ref 0.00–0.07)
Basophils Absolute: 0 10*3/uL (ref 0.0–0.1)
Basophils Relative: 0 %
Eosinophils Absolute: 0 10*3/uL (ref 0.0–0.5)
Eosinophils Relative: 0 %
HCT: 42.6 % (ref 39.0–52.0)
Hemoglobin: 13.4 g/dL (ref 13.0–17.0)
Lymphocytes Relative: 22 %
Lymphs Abs: 1 10*3/uL (ref 0.7–4.0)
MCH: 26.4 pg (ref 26.0–34.0)
MCHC: 31.5 g/dL (ref 30.0–36.0)
MCV: 83.9 fL (ref 80.0–100.0)
Metamyelocytes Relative: 2 %
Monocytes Absolute: 1.3 10*3/uL — ABNORMAL HIGH (ref 0.1–1.0)
Monocytes Relative: 28 %
Neutro Abs: 2.2 10*3/uL (ref 1.7–7.7)
Neutrophils Relative %: 48 %
Platelets: 52 10*3/uL — ABNORMAL LOW (ref 150–400)
RBC: 5.08 MIL/uL (ref 4.22–5.81)
RDW: 19.4 % — ABNORMAL HIGH (ref 11.5–15.5)
Smear Review: DECREASED
WBC: 4.6 10*3/uL (ref 4.0–10.5)
nRBC: 0 % (ref 0.0–0.2)

## 2023-06-25 LAB — IMMATURE PLATELET FRACTION: Immature Platelet Fraction: 6.3 % (ref 1.2–8.6)

## 2023-06-25 LAB — FERRITIN: Ferritin: 59 ng/mL (ref 24–336)

## 2023-06-30 NOTE — Progress Notes (Unsigned)
 [facility] [address], [place] [address]   CLINIC:  Medical Oncology/Hematology  PCP:  Sonny Masters, FNP [address] [place] [address] [phone]   REASON FOR VISIT:  Follow-up for pancytopenia  PRIOR THERAPY: None  CURRENT THERAPY: Surveillance  INTERVAL HISTORY:   Austin Cox 78 y.o. male returns for routine follow-up of thrombocytopenia.  He was last seen by Rojelio Brenner PA-C on 05/04/2023.  At today's visit, he reports feeling fair.  He denies any hospitalizations, surgeries, new diagnoses, or changes in baseline health status since his last visit.     He does have occasional non-drenching night sweats.  No fevers, chills, or unintentional weight loss. Chronic fatigue is at baseline.  He has easy bruising, takes aspirin 81 mg daily.  He denies any major bleeding events, recurrent infections, masses, or lymphadenopathy.  He is currently on methotrexate 4 tablets weekly, as well as Enbrel.  He denies any changes in bowel habits.  He has been taking daily iron tablet (with stool softener) since February 2025.  He has 25% energy and 100% appetite. He endorses that he is maintaining a stable weight.  ASSESSMENT & PLAN:  1.  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  Limited data prior to 2016 with normal platelets noted in 2009. - For the most part, thrombocytopenia from 2016-2022 has been mild, with platelets 100-150. - Downtrending platelets since May 2022 with platelets ranging between 70-90 - Diagnosed with rheumatoid arthritis around 2018 - Has been on methotrexate since about 2019, recently had methotrexate dose decreased to 5 tablets weekly on 08/05/2021.  Reduced to 4 tablets weekly as of 10/03/2021. - He has been on Enbrel since about 2020 - Hematology work-up (09/20/2021): Flow cytometry: No monoclonal  B-cell population or significant T-cell abnormalities identified.  (Negative for LGL leukemia, which can be seen in rheumatoid arthritis) Negative hepatitis C and hepatitis B Normal copper, folate, B12, methylmalonic acid SPEP negative.  Mildly elevated kappa free light chain 32.7 with normal lambda free light chain and light chain ratio.  Normal LDH. Reticulocyte 0.9% - Abdominal ultrasound (10/23/2021): Splenomegaly measuring 14.9 cm diameter, normal-appearing liver. - Bone marrow biopsy (12/12/2021): Hypercellular bone marrow with trilineage hematopoiesis.  Adequate number of megakaryocytes.  Mild myeloid changes present with no increase in blastic cells.  Overall features nonspecific/nondiagnostic, likely secondary in nature due to patient's known history of rheumatoid arthritis and methotrexate treatment, as well as possibly due to hypersplenism.  No evidence of lymphoproliferative process. - Cytogenetics (12/12/2021): 45,X,-Y[20].  Loss of Y chromosome noted, which is nonspecific; can happen in older men, but also can be seen in some hematologic malignancies and MDS in setting of clinical correlation. - PET scan (01/09/2022): Mild splenomegaly without any abnormal hypermetabolic activity.  Findings are NOT suggestive of lymphoma.  No hypermetabolic adenopathy on PET scan.  Additional findings discussed below. - CT abdomen/pelvis (06/04/2023): Splenomegaly measuring 16.4 cm - Patient had severe thrombocytopenia during COVID-19 infection in January 2024, with platelets as low as 18,000 on 03/29/2022, but recovered to 32,000 at time of discharge (03/31/2022). - Most recent labs (06/25/2023): Platelets 52, trending somewhat downward over the past 6 months.  Normal WBC 5.4, but monocytes elevated at 1.3.  Immature platelet fraction 6.3%. Labs from February 2025 showed normal LDH, normal B12/MMA, normal folate, normal copper - Reports easy bruising.  Denies any bleeding, B symptoms,  recurrent infections, or  lymphadenopathy. - DIFFERENTIAL DIAGNOSIS: Suspect benign splenomegaly in the setting of rheumatoid arthritis, which is likely contributing to thrombocytopenia.  Patient might also have some underlying immune mediated thrombocytopenia in the setting of his RA.  He may also have medication-induced thrombocytopenia related to methotrexate (occurs in 3% to 10% of patients with RA receiving MTX)  - PLAN: Continue surveillance with repeat labs and office visit in 4 months. - If platelets drop to <50, we will pursue trial of oral steroids.  Would also consider reaching out to rheumatology for adjustment of methotrexate dose. - No indication for repeat BMBx (nonspecific findings on 12/12/2021) at this time, per discussion with Dr. Cheree Cords on 05/04/2023.  2.  Anemia, mild and chronic - Baseline hemoglobin around 12 since at least January 2023 - Denies any major bleeding events.   - SPEP negative, as above. - He has been taking daily iron tablet (with stool softener) since February 2025 - Most recent labs (06/25/2023): Hgb 13.4/MCV 83.9 Ferritin 59, iron saturation 11% Labs from February 2025 showed normal LDH, normal GFR, normal B12/MMA, normal folate, normal copper - PLAN: Hemoglobin normalized with iron levels improved after initiation of iron therapy. - Continue taking iron tablet daily w/ stool softener - Continue GI follow-up (see below)  3.  Monocytosis - Persistent monocytosis since July 2023 with monocytes 1.1 (09/20/2021), 2.2 (12/12/2021), and 2.2 (06/18/2022). - Most recent CBC/D (05/01/2023) with elevated monocytes 2.5. - Bone marrow biopsy (12/12/2021): Hypercellular bone marrow with trilineage hematopoiesis.  Adequate number of megakaryocytes.  Mild myeloid changes present with no increase in blastic cells.  Overall features nonspecific/nondiagnostic, likely secondary in nature due to patient's known history of rheumatoid arthritis and methotrexate treatment, as well as possibly due to  hypersplenism.  No evidence of lymphoproliferative process. - No B symptoms - No palpable lymphadenopathy on exam  - PLAN: Persistent monocytosis may be reactive from RA.  Would consider repeat bone marrow biopsy if any major changes from baseline (nonspecific changes on BMBx from 12/12/2021).  No indication for repeat BMBx at this time, per discussion with Dr. Cheree Cords on 05/04/2023.     4.   INCIDENTAL PET SCAN FINDING: Abnormal appearance of colon on PET scan - PET scan (obtained 01/09/2022 due to unexplained splenomegaly/thrombocytopenia) showed focus intense focal metabolic activity in the cecum at the level of the orifice of the appendix - Patient denies any bright blood per rectum, melena, or changes in bowel habits.  No unintentional weight loss. - Last colonoscopy was about 10 years ago, per patient - Patient seen by GI Azalee Lenz, PA-C) on 05/19/2023, with plans for possible repeat colonoscopy, depending on results of CT scan - CT abdomen/pelvis (06/04/2023): Possible soft tissue thickening at the base of the cecum, near origin of the appendix measuring 16 mm; appears to be in the general location of previously noted PET/CT activity.  Correlation with colonoscopy suggested.   - PLAN: Continue GI follow-up for upcoming colonoscopy.  Have notified GI provider of the above CT findings.   5.   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 - Abdominal ultrasound (10/23/2021) with incidental finding of aneurysmal dilation of abdominal aorta with proximal abdominal aorta measuring 5.1 cm in diameter - CT abdomen/pelvis with contrast (06/04/2023): Distal descending thoracic aortic aneurysm measures up to 7.5 cm Diffuse aneurysmal dilatation of abdominal aorta - PLAN: Continue  follow-up with Cardiothoracic Surgery (Dr.  Luna Salinas) and Vascular Surgery (Dr. Fulton Job)   6.  Rheumatoid arthritis - Predominantly involves knees and ankles for the past 4 to 5 years.  Seen by Dr. Ashton Lawyer at [facility]. - He has been on Enbrel and methotrexate for 2 to 3 years - Methotrexate dose decreased to 4 tablets weekly on 10/03/2021   7.  Other history - PMH: Rheumatoid arthritis, hypertension, hyperlipidemia, carotid artery stenosis, small vessel cerebrovascular disease, coronary artery disease - Walks with the help of cane/walker secondary to rheumatoid arthritis. 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  Exposure to gasoline and diesel present.  Quit smoking 45 years ago. - No family history of low platelets.  Sister died of metastatic breast cancer.  Daughter died of non-Hodgkin's lymphoma.  Mother had head and neck cancer.   PLAN SUMMARY: >> Same-day labs (CBC/D, ferritin, iron/TIBC, immature platelet fraction) + OFFICE visit in 4 months  ** CC'd chart to gastroenterology providers regarding abnormal CT findings     REVIEW OF SYSTEMS:   Review of Systems  Constitutional:  Negative for appetite change, chills, diaphoresis, fatigue, fever and unexpected weight change.  HENT:   Negative for lump/mass and nosebleeds.   Eyes:  Negative for eye problems.  Respiratory:  Positive for cough (seasonal allergies). Negative for hemoptysis and shortness of breath.   Cardiovascular:  Negative for chest pain, leg swelling and palpitations.  Gastrointestinal:  Negative for abdominal pain, blood in stool, constipation, diarrhea, nausea and vomiting.  Genitourinary:  Negative for hematuria. Difficulty urinating: prostate issues.  Skin: Negative.   Neurological:  Positive for numbness. Negative for dizziness, headaches and light-headedness.  Hematological:  Does not bruise/bleed easily.    PHYSICAL EXAM:  ECOG PERFORMANCE STATUS: 2 - Symptomatic, <50% confined to bed  Vitals:   07/01/23 1048  BP:  137/84  Pulse: 72  Resp: 18  Temp: 97.9 F (36.6 C)  SpO2: 95%    Filed Weights   07/01/23 1048  Weight: 203 lb (92.1 kg)    Physical Exam Constitutional:      Appearance: Normal appearance. He is obese.  Cardiovascular:     Heart sounds: Normal heart sounds.  Pulmonary:     Breath sounds: Normal breath sounds. Decreased air movement present.  Neurological:     General: No focal deficit present.     Mental Status: Mental status is at baseline.  Psychiatric:        Behavior: Behavior normal. Behavior is cooperative.     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   Past Surgical History:  Procedure Laterality Date   CATARACT EXTRACTION     ENDARTERECTOMY Left 04/15/2021   Procedure: LEFT CAROTID ENDARTERECTOMY WITH;  Surgeon: Young Hensen, MD;  Location: [facility] OR;  Service: Vascular;  Laterality: Left;   EYE SURGERY     detached retina right eye   FRACTURE SURGERY     right leg   Stent in heart      SOCIAL HISTORY:  Social History   Socioeconomic History   Marital status: Married    Spouse name: Not on file   Number  of children: 3   Years of education: Not on file   Highest education level: Not on file  Occupational History   Occupation: retired  Tobacco Use   Smoking status: Former    Current packs/day: 0.00    Average packs/day: 0.2 packs/day for 10.0 years (2.0 ttl pk-yrs)    Types: Cigarettes    Start date: 07/05/1964    Quit date: 07/06/1974    Years since quitting: 49.0   Smokeless tobacco: Never  Vaping Use   Vaping status: Never Used  Substance and Sexual Activity   Alcohol use: Not Currently    Alcohol/week: 1.0 standard drink of alcohol    Types: 1 Cans of beer per  week   Drug use: Never   Sexual activity: Not on file  Other Topics Concern   Not on file  Social History Narrative   3 children - one passed away   Son in [place]   They raised 2 teenage grandchildren after their daughter and son in law both passed away    Social Drivers of Health   Financial Resource Strain: Low Risk  (05/22/2022)   Received from [employer], [facility]   Overall Financial Resource Strain (CARDIA)    Difficulty of Paying Living Expenses: Not hard at all  Recent Concern: Financial Resource Strain - High Risk (04/08/2022)   Overall Financial Resource Strain (CARDIA)    Difficulty of Paying Living Expenses: Hard  Food Insecurity: No Food Insecurity (05/22/2022)   Received from [facility], [facility]   Hunger Vital Sign    Worried About Running Out of Food in the Last Year: Never true    Ran Out of Food in the Last Year: Never true  Transportation Needs: No Transportation Needs (05/22/2022)   Received from [facility], [facility]   PRAPARE - Transportation    Lack of Transportation (Medical): No    Lack of Transportation (Non-Medical): No  Physical Activity: Inactive (04/08/2022)   Exercise Vital Sign    Days of Exercise per Week: 0 days    Minutes of Exercise per Session: 0 min  Stress: No Stress Concern Present (04/08/2022)   [employer] of Occupational Health - Occupational Stress Questionnaire    Feeling of Stress : Not at all  Social Connections: Moderately Isolated (04/08/2022)   Social Connection and Isolation Panel [NHANES]    Frequency of Communication with Friends and Family: More than three times a week    Frequency of Social Gatherings with Friends and Family: More than three times a week    Attends Religious Services: Never    Database administrator or Organizations: No    Attends Banker Meetings: Never    Marital Status: Married  Catering manager Violence: Not At Risk (04/08/2022)   Humiliation, Afraid, Rape, and  Kick questionnaire    Fear of Current or Ex-Partner: No    Emotionally Abused: No    Physically Abused: No    Sexually Abused: No    FAMILY HISTORY:  Family History  Problem Relation Age of Onset   Cancer Mother        head neck cancer   Breast cancer Sister    Non-Hodgkin's lymphoma Daughter    Colon cancer Neg Hx    Liver disease Neg Hx     CURRENT MEDICATIONS:  Outpatient Encounter Medications as of 07/01/2023  Medication Sig   acetaminophen (TYLENOL) 650 MG CR tablet Take 650 mg by mouth every 8 (eight) hours as needed for  pain.   aspirin 81 MG EC tablet Take 81 mg by mouth daily.   baclofen (LIORESAL) 10 MG tablet Take 10 mg by mouth 2 (two) times daily.   Cholecalciferol 25 MCG (1000 UT) capsule Take 1 capsule (1,000 Units total) by mouth daily.   clotrimazole (LOTRIMIN) 1 % cream APPLY TO AFFECTED AREA TWICE A DAY   diclofenac Sodium (VOLTAREN) 1 % GEL PLACE ONTO THE SKIN 4 (FOUR) TIMES A DAY AS NEEDED FOR UP TO 30 DAYS.   etanercept (ENBREL SURECLICK) 50 MG/ML injection Inject 50 mg into the skin every Friday.   ferrous sulfate 325 (65 FE) MG EC tablet Take 1 tablet (325 mg total) by mouth daily with breakfast.   folic acid (FOLVITE) 1 MG tablet Take 1 mg by mouth daily.   gabapentin (NEURONTIN) 300 MG capsule Take 300 mg by mouth 2 (two) times daily.   methotrexate (RHEUMATREX) 2.5 MG tablet Take 4 mg by mouth every Tuesday.   metoprolol succinate (TOPROL-XL) 25 MG 24 hr tablet Take 1 tablet (25 mg total) by mouth daily.   nitroGLYCERIN (NITROSTAT) 0.3 MG SL tablet Place 1 tablet (0.3 mg total) under the tongue every 5 (five) minutes as needed.   rosuvastatin (CRESTOR) 40 MG tablet Take 1 tablet (40 mg total) by mouth daily.   Saw Palmetto 450-15 MG CAPS Take 2 tablets by mouth daily.   tamsulosin (FLOMAX) 0.4 MG CAPS capsule Take 1 capsule (0.4 mg total) by mouth at bedtime.   Tetrahydrozoline HCl (VISINE OP) Place 1 drop into both eyes daily as needed (dry eyes).   No  facility-administered encounter medications on file as of 07/01/2023.    ALLERGIES:  No Known Allergies  LABORATORY DATA:  I have reviewed the labs as listed.  CBC    Component Value Date/Time   WBC 4.6 06/25/2023 1429   RBC 5.08 06/25/2023 1429   HGB 13.4 06/25/2023 1429   HGB 12.1 (L) 04/24/2023 0924   HCT 42.6 06/25/2023 1429   HCT 39.3 04/24/2023 0924   PLT 52 (L) 06/25/2023 1429   PLT 58 (LL) 04/24/2023 0924   MCV 83.9 06/25/2023 1429   MCV 81 04/24/2023 0924   MCH 26.4 06/25/2023 1429   MCHC 31.5 06/25/2023 1429   RDW 19.4 (H) 06/25/2023 1429   RDW 17.0 (H) 04/24/2023 0924   LYMPHSABS 1.0 06/25/2023 1429   LYMPHSABS 1.1 04/24/2023 0924   MONOABS 1.3 (H) 06/25/2023 1429   EOSABS 0.0 06/25/2023 1429   EOSABS 0.0 04/24/2023 0924   BASOSABS 0.0 06/25/2023 1429   BASOSABS 0.0 04/24/2023 0924      Latest Ref Rng & Units 05/01/2023   11:41 AM 04/24/2023    9:24 AM 12/16/2022   11:17 AM  CMP  Glucose 70 - 99 mg/dL 81  90  96   BUN 8 - 23 mg/dL 19  11  14    Creatinine 0.61 - 1.24 mg/dL 1.61  0.96  0.45   Sodium 135 - 145 mmol/L 134  138  134   Potassium 3.5 - 5.1 mmol/L 4.0  3.9  3.6   Chloride 98 - 111 mmol/L 102  102  101   CO2 22 - 32 mmol/L 26  23  25    Calcium 8.9 - 10.3 mg/dL 9.4  9.2  9.1   Total Protein 6.5 - 8.1 g/dL 7.2  6.9  6.7   Total Bilirubin 0.0 - 1.2 mg/dL 1.0  1.0  0.9   Alkaline Phos 38 - 126 U/L  53  71  48   AST 15 - 41 U/L 19  16  17    ALT 0 - 44 U/L 17  14  17      DIAGNOSTIC IMAGING:  I have independently reviewed the relevant imaging and discussed with the patient.   WRAP UP:  All questions were answered. The patient knows to call the clinic with any problems, questions or concerns.  Medical decision making: Moderate  Time spent on visit: I spent 20 minutes counseling the patient face to face. The total time spent in the appointment was 30 minutes and more than 50% was on counseling.  Sonnie Dusky, PA-C  07/01/23 1:22 PM

## 2023-07-01 ENCOUNTER — Inpatient Hospital Stay (HOSPITAL_BASED_OUTPATIENT_CLINIC_OR_DEPARTMENT_OTHER): Payer: No Typology Code available for payment source | Admitting: Physician Assistant

## 2023-07-01 ENCOUNTER — Ambulatory Visit: Payer: No Typology Code available for payment source

## 2023-07-01 VITALS — Ht 72.0 in | Wt 209.0 lb

## 2023-07-01 DIAGNOSIS — Z0000 Encounter for general adult medical examination without abnormal findings: Secondary | ICD-10-CM | POA: Diagnosis not present

## 2023-07-01 DIAGNOSIS — D61818 Other pancytopenia: Secondary | ICD-10-CM | POA: Diagnosis not present

## 2023-07-01 NOTE — Progress Notes (Signed)
 Subjective:   Austin Cox is a 78 y.o. who presents for a Medicare Wellness preventive visit.  Visit Complete: Virtual I connected with  Austin Cox on 07/01/23 by a audio enabled telemedicine application and verified that I am speaking with the correct person using two identifiers.  Patient Location: Home  Provider Location: Home Office  I discussed the limitations of evaluation and management by telemedicine. The patient expressed understanding and agreed to proceed.  Vital Signs: Because this visit was a virtual/telehealth visit, some criteria may be missing or patient reported. Any vitals not documented were not able to be obtained and vitals that have been documented are patient reported.  VideoDeclined- This patient declined Librarian, academic. Therefore the visit was completed with audio only.  Persons Participating in Visit: Patient assisted by wife.  AWV Questionnaire: No: Patient Medicare AWV questionnaire was not completed prior to this visit.  Cardiac Risk Factors include: advanced age (>68men, >56 women);male gender;hypertension;sedentary lifestyle     Objective:    Today's Vitals   07/01/23 1551  Weight: 209 lb (94.8 kg)  Height: 6' (1.829 m)   Body mass index is 28.35 kg/m.     07/01/2023    3:56 PM 07/01/2023   10:55 AM 05/04/2023   12:57 PM 12/18/2022    8:38 AM 06/23/2022    8:30 AM 04/08/2022    2:34 PM 03/29/2022    8:15 AM  Advanced Directives  Does Patient Have a Medical Advance Directive? No No No No No No No  Would patient like information on creating a medical advance directive? Yes (MAU/Ambulatory/Procedural Areas - Information given) No - Patient declined No - Patient declined No - Patient declined No - Patient declined No - Patient declined No - Patient declined    Current Medications (verified) Outpatient Encounter Medications as of 07/01/2023  Medication Sig   acetaminophen (TYLENOL) 650 MG CR tablet Take  650 mg by mouth every 8 (eight) hours as needed for pain.   aspirin 81 MG EC tablet Take 81 mg by mouth daily.   baclofen (LIORESAL) 10 MG tablet Take 10 mg by mouth 2 (two) times daily.   Cholecalciferol 25 MCG (1000 UT) capsule Take 1 capsule (1,000 Units total) by mouth daily.   clotrimazole (LOTRIMIN) 1 % cream APPLY TO AFFECTED AREA TWICE A DAY   diclofenac Sodium (VOLTAREN) 1 % GEL PLACE ONTO THE SKIN 4 (FOUR) TIMES A DAY AS NEEDED FOR UP TO 30 DAYS.   etanercept (ENBREL SURECLICK) 50 MG/ML injection Inject 50 mg into the skin every Friday.   ferrous sulfate 325 (65 FE) MG EC tablet Take 1 tablet (325 mg total) by mouth daily with breakfast.   folic acid (FOLVITE) 1 MG tablet Take 1 mg by mouth daily.   gabapentin (NEURONTIN) 300 MG capsule Take 300 mg by mouth 2 (two) times daily.   methotrexate (RHEUMATREX) 2.5 MG tablet Take 4 mg by mouth every Tuesday.   metoprolol succinate (TOPROL-XL) 25 MG 24 hr tablet Take 1 tablet (25 mg total) by mouth daily.   nitroGLYCERIN (NITROSTAT) 0.3 MG SL tablet Place 1 tablet (0.3 mg total) under the tongue every 5 (five) minutes as needed.   rosuvastatin (CRESTOR) 40 MG tablet Take 1 tablet (40 mg total) by mouth daily.   Saw Palmetto 450-15 MG CAPS Take 2 tablets by mouth daily.   tamsulosin (FLOMAX) 0.4 MG CAPS capsule Take 1 capsule (0.4 mg total) by mouth at bedtime.   Tetrahydrozoline  HCl (VISINE OP) Place 1 drop into both eyes daily as needed (dry eyes).   No facility-administered encounter medications on file as of 07/01/2023.    Allergies (verified) Patient has no known allergies.   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 OR;  Service: Vascular;  Laterality: Left;   EYE SURGERY     detached retina right eye   FRACTURE SURGERY     right leg   Stent in heart     Family History  Problem Relation Age of Onset   Cancer Mother        head neck cancer   Breast cancer Sister    Non-Hodgkin's lymphoma Daughter    Colon cancer Neg Hx    Liver disease Neg Hx    Social History   Socioeconomic History   Marital status: Married    Spouse name: Not on file   Number of children: 3   Years of education: Not on file   Highest education level: Not on file  Occupational History   Occupation: retired  Tobacco Use   Smoking status: Former    Current packs/day: 0.00    Average packs/day: 0.2 packs/day for 10.0 years (2.0 ttl pk-yrs)    Types: Cigarettes    Start date: 07/05/1964    Quit date: 07/06/1974    Years since quitting: 49.0   Smokeless tobacco: Never  Vaping Use   Vaping status: Never Used  Substance and Sexual Activity   Alcohol use: Not Currently    Alcohol/week: 1.0 standard drink of alcohol    Types: 1 Cans of beer per week   Drug use: Never   Sexual activity: Not on file  Other Topics Concern   Not on file  Social History Narrative   3 children - one passed away   Son in [place]   They raised 2 teenage grandchildren after their daughter and son in law both passed away    Social Drivers of Health   Financial Resource Strain: Low Risk  (07/01/2023)   Overall Financial Resource Strain (CARDIA)    Difficulty of Paying Living Expenses: Not hard at all  Food Insecurity: No Food Insecurity (07/01/2023)   Hunger Vital Sign    Worried About Running Out of Food in the Last Year: Never true    Ran Out of Food in the Last Year: Never true  Transportation Needs: No Transportation Needs (07/01/2023)   PRAPARE - Administrator, Civil Service  (Medical): No    Lack of Transportation (Non-Medical): No  Physical Activity: Insufficiently Active (07/01/2023)   Exercise Vital Sign    Days of Exercise per Week: 3 days    Minutes of Exercise per Session: 30 min  Stress: No Stress Concern Present (07/01/2023)   [employer] of Occupational Health - Occupational Stress Questionnaire    Feeling of Stress : Not at all  Social Connections: Moderately Isolated (07/01/2023)   Social Connection and Isolation Panel [NHANES]  Frequency of Communication with Friends and Family: More than three times a week    Frequency of Social Gatherings with Friends and Family: Three times a week    Attends Religious Services: Never    Active Member of Clubs or Organizations: No    Attends Banker Meetings: Never    Marital Status: Married    Tobacco Counseling Counseling given: Not Answered    Clinical Intake:  Pre-visit preparation completed: Yes  Pain : No/denies pain     Diabetes: No  Lab Results  Component Value Date   HGBA1C 5.7 (H) 10/22/2022   HGBA1C 5.6 09/18/2021     How often do you need to have someone help you when you read instructions, pamphlets, or other written materials from your doctor or pharmacy?: 1 - Never  Interpreter Needed?: No  Information entered by :: Kandis Fantasia LPN   Activities of Daily Living     07/01/2023    3:52 PM  In your present state of health, do you have any difficulty performing the following activities:  Hearing? 1  Vision? 0  Difficulty concentrating or making decisions? 0  Walking or climbing stairs? 1  Dressing or bathing? 0  Doing errands, shopping? 1  Preparing Food and eating ? N  Using the Toilet? N  In the past six months, have you accidently leaked urine? N  Do you have problems with loss of bowel control? N  Managing your Medications? N  Managing your Finances? N  Housekeeping or managing your Housekeeping? N    Patient Care Team: Sonny Masters, FNP  as PCP - General (Family Medicine) Doreatha Massed, MD as Medical Oncologist (Hematology)  Indicate any recent Medical Services you may have received from other than [facility] providers in the past year (date may be approximate).     Assessment:   This is a routine wellness examination for Destan.  Hearing/Vision screen Hearing Screening - Comments:: Hard of hearing; has hearing aids but doesn't wear them  Vision Screening - Comments:: Wears rx glasses - up to date with routine eye exams with Dr. Conley Rolls    Goals Addressed   None    Depression Screen     07/01/2023    3:55 PM 04/24/2023    8:57 AM 10/22/2022    9:59 AM 08/14/2022   11:37 AM 05/01/2022   10:13 AM 04/08/2022    2:33 PM 03/05/2022    2:47 PM  PHQ 2/9 Scores  PHQ - 2 Score 0 0 0 0 0 0 0  PHQ- 9 Score  0 0 0 0      Fall Risk     07/01/2023    3:57 PM 04/24/2023    8:57 AM 10/22/2022    9:59 AM 05/01/2022   10:13 AM 04/08/2022    2:31 PM  Fall Risk   Falls in the past year? 0 0 0 0 0  Number falls in past yr: 0    0  Injury with Fall? 0    0  Risk for fall due to : No Fall Risks No Fall Risks   No Fall Risks  Follow up Falls prevention discussed;Education provided;Falls evaluation completed Falls evaluation completed   Falls prevention discussed    MEDICARE RISK AT HOME:  Medicare Risk at Home Any stairs in or around the home?: No If so, are there any without handrails?: No Home free of loose throw rugs in walkways, pet beds, electrical cords, etc?: Yes Adequate lighting in your  home to reduce risk of falls?: Yes Life alert?: No Use of a cane, walker or w/c?: Yes Grab bars in the bathroom?: Yes Shower chair or bench in shower?: No Elevated toilet seat or a handicapped toilet?: Yes  TIMED UP AND GO:  Was the test performed?  No  Cognitive Function: 6CIT completed        07/01/2023    3:58 PM 04/08/2022    2:34 PM 04/05/2021    3:51 PM  6CIT Screen  What Year? 0 points 0 points 0 points  What month? 0 points  0 points 0 points  What time? 0 points 0 points 0 points  Count back from 20 0 points 0 points 0 points  Months in reverse 0 points 0 points 0 points  Repeat phrase 0 points 0 points 2 points  Total Score 0 points 0 points 2 points    Immunizations Immunization History  Administered Date(s) Administered   Fluad Quad(high Dose 65+) 11/28/2016, 12/26/2020, 12/25/2021   Influenza, High Dose Seasonal PF 11/22/2013, 12/18/2017   Influenza,inj,quad, With Preservative 12/20/2018   Influenza-Unspecified 11/22/2013   PFIZER(Purple Top)SARS-COV-2 Vaccination 05/13/2019, 06/08/2019, 01/04/2020, 07/13/2020   PNEUMOCOCCAL CONJUGATE-20 03/31/2022   Pfizer Covid-19 Vaccine Bivalent Booster 72yrs & up 12/26/2020   Pneumococcal Conjugate-13 07/19/2020   Td 03/19/2021   Tdap 09/16/2010   Zoster Recombinant(Shingrix) 03/19/2021, 05/20/2021    Screening Tests Health Maintenance  Topic Date Due   COVID-19 Vaccine (6 - 2024-25 season) 11/16/2022   INFLUENZA VACCINE  10/16/2023   Medicare Annual Wellness (AWV)  06/30/2024   DTaP/Tdap/Td (3 - Td or Tdap) 03/20/2031   Pneumonia Vaccine 55+ Years old  Completed   Zoster Vaccines- Shingrix  Completed   HPV VACCINES  Aged Out   Meningococcal B Vaccine  Aged Out   Hepatitis C Screening  Discontinued    Health Maintenance  Health Maintenance Due  Topic Date Due   COVID-19 Vaccine (6 - 2024-25 season) 11/16/2022    Additional Screening:  Vision Screening: Recommended annual ophthalmology exams for early detection of glaucoma and other disorders of the eye.  Dental Screening: Recommended annual dental exams for proper oral hygiene  Community Resource Referral / Chronic Care Management: CRR required this visit?  No   CCM required this visit?  No     Plan:     I have personally reviewed and noted the following in the patient's chart:   Medical and social history Use of alcohol, tobacco or illicit drugs  Current medications and  supplements including opioid prescriptions. Patient is not currently taking opioid prescriptions. Functional ability and status Nutritional status Physical activity Advanced directives List of other physicians Hospitalizations, surgeries, and ER visits in previous 12 months Vitals Screenings to include cognitive, depression, and falls Referrals and appointments  In addition, I have reviewed and discussed with patient certain preventive protocols, quality metrics, and best practice recommendations. A written personalized care plan for preventive services as well as general preventive health recommendations were provided to patient.     Kandis Fantasia [place], [place]   1/61/0960   After Visit Summary: (MyChart) Due to this being a telephonic visit, the after visit summary with patients personalized plan was offered to patient via MyChart   Notes: Nothing significant to report at this time.

## 2023-07-01 NOTE — Patient Instructions (Signed)
 Austin Cox , Thank you for taking time to come for your Medicare Wellness Visit. I appreciate your ongoing commitment to your health goals. Please review the following plan we discussed and let me know if I can assist you in the future.   Referrals/Orders/Follow-Ups/Clinician Recommendations: Aim for 30 minutes of exercise or brisk walking, 6-8 glasses of water, and 5 servings of fruits and vegetables each day.  This is a list of the screening recommended for you and due dates:  Health Maintenance  Topic Date Due   COVID-19 Vaccine (6 - 2024-25 season) 11/16/2022   Flu Shot  10/16/2023   Medicare Annual Wellness Visit  06/30/2024   DTaP/Tdap/Td vaccine (3 - Td or Tdap) 03/20/2031   Pneumonia Vaccine  Completed   Zoster (Shingles) Vaccine  Completed   HPV Vaccine  Aged Out   Meningitis B Vaccine  Aged Out   Hepatitis C Screening  Discontinued    Advanced directives: (ACP Link)Information on Advanced Care Planning can be found at [place]  Secretary of [facility] Advance Health Care Directives Advance Health Care Directives. [email or web address]   Next Medicare Annual Wellness Visit scheduled for next year: Yes

## 2023-07-01 NOTE — Patient Instructions (Signed)
 [facility] at [facility] **VISIT SUMMARY & IMPORTANT INSTRUCTIONS **   You were seen today by Sheril Dines PA-C for your low platelets.    LOW PLATELETS Your low platelets may be caused by your enlarged spleen, or may be caused by your overactive immune system which can also attack your platelets. Your enlarged spleen is most likely related to your autoimmune disease (rheumatoid arthritis).   You do not need any treatment for your low platelets at this time.  We will continue to monitor your platelets closely at follow-up visits.  ABNORMAL FINDING ON PET SCAN & CT SCAN: Your PET scan showed an area of abnormality in your colon.  This abnormality was also seen on your recent CT scan. We we will reach out to your gastroenterologist at [facility] so that they can continue to investigate this lesion.  IRON DEFICIENCY ANEMIA: Your blood and iron levels are both mildly low.  This may be related to small amounts of blood loss from your intestines, which is another reason why it is important for you to continue this. Continue taking iron tablet (ferrous sulfate 325 mg) every day along with stool softener.  These are both available over-the-counter.   FOLLOW-UP APPOINTMENT: Same day labs and office visit in 4 months  ** Thank you for trusting me with your healthcare!  I strive to provide all of my patients with quality care at each visit.  If you receive a survey for this visit, I would be so grateful to you for taking the time to provide feedback.  Thank you in advance!  ~ Kashira Behunin                   Dr. Paulett Boros   &   Sheril Dines, PA-C   - - - - - - - - - - - - - - - - - -    Thank you for choosing [facility] at [facility] to provide your oncology and hematology care.  To afford each patient quality time with our provider, please arrive at least 15 minutes before your scheduled appointment time.    If you have a lab appointment with the Cancer Center please come in thru the Main Entrance and check in at the main information desk.  You need to re-schedule your appointment should you arrive 10 or more minutes late.  We strive to give you quality time with our providers, and arriving late affects you and other patients whose appointments are after yours.  Also, if you no show three or more times for appointments you may be dismissed from the clinic at the providers discretion.     Again, thank you for choosing [facility].  Our hope is that these requests will decrease the amount of time that you wait before being seen by our physicians.       _____________________________________________________________  Should you have questions after your visit to [facility], please contact our office at [phone] and follow the prompts.  Our office hours are 8:00 a.m. and 4:30 p.m. Monday - Friday.  Please note that voicemails left after 4:00 p.m. may not be returned until the following business day.  We are closed weekends and major holidays.  You do have access to a nurse 24-7, just call the main number to the clinic [phone] and do not press any options, hold on the line and a nurse will answer the  phone.    For prescription refill requests, have your pharmacy contact our office and allow 72 hours.

## 2023-07-09 ENCOUNTER — Other Ambulatory Visit (HOSPITAL_COMMUNITY)

## 2023-07-13 NOTE — Progress Notes (Unsigned)
 Patient name: Austin Cox MRN: [id number] DOB: 1946-03-14 Sex: male  REASON FOR VISIT: 6 month follow-up AAA/carotid surveillance  HPI: Austin Cox is a 78 y.o. male with history of coronary artery disease status post PCI, rheumatoid arthritis, pancytopenia with moderate thrombocytopenia, carotid stenosis presents for 6 month follow-up of abdominal aortic aneurysm and carotid surveillance.    Patient got a ultrasound on 10/23/2021 showing a 5.1 cm abdominal aortic aneurysm ordered by Sheril Dines.  His AAA last measured 4.3 cm.   He has also had a left carotid endarterectomy on 04/15/2021 for an asymptomatic high-grade stenosis with string sign.  Postop course was complicated by a hematoma that was managed with observation.  He does not smoke and quit in the 1980's.  He denies any recent stroke symptoms on follow-up today.  No abdominal pain today.  He is also followed by Dr. 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  On last evaluation both his ascending and descending thoracic aneurysm met size criteria for repair but he had prohibitive risk factors for surgery including his thrombocytopenia with pancytopenia.   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    Past Surgical History:  Procedure Laterality Date   CATARACT EXTRACTION     ENDARTERECTOMY Left 04/15/2021   Procedure: LEFT CAROTID ENDARTERECTOMY WITH;  Surgeon: Young Hensen, MD;  Location: [facility] OR;  Service: Vascular;  Laterality: Left;   EYE SURGERY     detached retina right eye   FRACTURE SURGERY     right leg   Stent in heart      Family History  Problem  Relation Age of Onset   Cancer Mother        head neck cancer   Breast cancer Sister    Non-Hodgkin's lymphoma Daughter    Colon cancer Neg Hx    Liver disease Neg Hx     SOCIAL HISTORY: Social History   Tobacco Use   Smoking status: Former    Current packs/day: 0.00    Average packs/day: 0.2 packs/day for 10.0 years (2.0 ttl pk-yrs)    Types: Cigarettes    Start date: 07/05/1964    Quit date: 07/06/1974    Years since quitting: 49.0   Smokeless tobacco: Never  Substance Use Topics   Alcohol  use: Not Currently    Alcohol /week: 1.0 standard drink of alcohol     Types: 1 Cans of beer per week    No Known Allergies  Current Outpatient Medications  Medication Sig Dispense Refill   acetaminophen  (TYLENOL ) 650 MG CR tablet Take 650 mg by mouth every 8 (eight) hours as needed for pain.     aspirin  81 MG EC tablet Take 81 mg by mouth daily.     baclofen  (LIORESAL ) 10 MG tablet Take 10 mg by mouth 2 (two) times daily.     Cholecalciferol  25 MCG (1000 UT) capsule Take 1 capsule (1,000 Units total) by mouth daily. 90 capsule 2   clotrimazole  (LOTRIMIN ) 1 % cream APPLY TO AFFECTED AREA TWICE A DAY 60 g 1   diclofenac Sodium (VOLTAREN) 1 % GEL PLACE ONTO  THE SKIN 4 (FOUR) TIMES A DAY AS NEEDED FOR UP TO 30 DAYS. 100 g 1   etanercept (ENBREL SURECLICK) 50 MG/ML injection Inject 50 mg into the skin every Friday.     ferrous sulfate  325 (65 FE) MG EC tablet Take 1 tablet (325 mg total) by mouth daily with breakfast. 90 tablet 3   folic acid  (FOLVITE ) 1 MG tablet Take 1 mg by mouth daily.     gabapentin  (NEURONTIN ) 300 MG capsule Take 300 mg by mouth 2 (two) times daily.     methotrexate  (RHEUMATREX) 2.5 MG tablet Take 4 mg by mouth every Tuesday.     metoprolol  succinate (TOPROL -XL) 25 MG 24 hr tablet Take 1 tablet (25 mg total) by mouth daily. 100 tablet 3   nitroGLYCERIN  (NITROSTAT ) 0.3 MG SL tablet Place 1 tablet (0.3 mg total) under the tongue every 5 (five) minutes as needed. 90 tablet 0    rosuvastatin  (CRESTOR ) 40 MG tablet Take 1 tablet (40 mg total) by mouth daily. 90 tablet 3   Saw Palmetto 450-15 MG CAPS Take 2 tablets by mouth daily.     tamsulosin  (FLOMAX ) 0.4 MG CAPS capsule Take 1 capsule (0.4 mg total) by mouth at bedtime. 90 capsule 3   Tetrahydrozoline HCl (VISINE OP) Place 1 drop into both eyes daily as needed (dry eyes).     No current facility-administered medications for this visit.    REVIEW OF SYSTEMS:  [X]  denotes positive finding, [ ]  denotes negative finding Cardiac  Comments:  Chest pain or chest pressure:    Shortness of breath upon exertion:    Short of breath when lying flat:    Irregular heart rhythm:        Vascular    Pain in calf, thigh, or hip brought on by ambulation:    Pain in feet at night that wakes you up from your sleep:     Blood clot in your veins:    Leg swelling:         Pulmonary    Oxygen at home:    Productive cough:     Wheezing:         Neurologic    Sudden weakness in arms or legs:     Sudden numbness in arms or legs:     Sudden onset of difficulty speaking or slurred speech:    Temporary loss of vision in one eye:     Problems with dizziness:         Gastrointestinal    Blood in stool:     Vomited blood:         Genitourinary    Burning when urinating:     Blood in urine:        Psychiatric    Major depression:         Hematologic    Bleeding problems:    Problems with blood clotting too easily:        Skin    Rashes or ulcers:        Constitutional    Fever or chills:      PHYSICAL EXAM: There were no vitals filed for this visit.   GENERAL: The patient is a well-nourished male, in no acute distress. The vital signs are documented above. CARDIAC: There is a regular rate and rhythm.  VASCULAR:  Left neck incision well-healed from previous carotid endarterectomy PULMONARY: No respiratory distress. ABDOMEN: Soft and non-tender.  No pain with deep palpation. MUSCULOSKELETAL: There are no  major deformities or cyanosis. NEUROLOGIC: No focal weakness or paresthesias are detected.  Cranial nerves II through XII grossly intact SKIN: There are no ulcers or rashes noted. PSYCHIATRIC: The patient has a normal affect.  DATA:   AAA duplex today shows 5.0 cm AAA (previously 4.3 cm)      Carotid ultrasound today with minimal 1 to 39% carotid stenosis bilaterally   Assessment/Plan:  78 year old male well-known to our practice that has previously undergone a left carotid endarterectomy on 04/15/2021 for an asymptomatic high-grade stenosis.  He presents for follow-up of AAA and for his carotid disease.  I discussed that his carotid ultrasound shows minimal 1 to 39% stenosis bilaterally.  He continues to have a patent left carotid endarterectomy site.  Pleased with his progress here.  Will repeat carotid ultrasound in 1 year.  AAA duplex today also shows a 5.0 cm AAA.  This does not meet size criteria for repair.  Discussed that we repair these greater than 5.5 cm.  He is also being followed by CT surgery for a 6.4 cm ascending aneurysm and 7.4 cm descending aneurysm.  He is felt to be high risk due to his comorbidities.  Recent CT of his abdomen pelvis shows continued increase in his distal descending thoracic aorta now measuring up to 7.5 cm.  Previously not felt to be a surgery candidate with Dr. Luna Salinas due to his risk factors.  I have recommended updated CTA chest abdomen pelvis to have a better imaging of his descending thoracic aorta.  We do have some newer devices on the market now including the TABME that may provide another option for treatment with stent graft repair.  I will have him follow-up with me after CT.  Young Hensen, MD [facility] Office: [phone]

## 2023-07-14 ENCOUNTER — Ambulatory Visit (HOSPITAL_COMMUNITY)
Admission: RE | Admit: 2023-07-14 | Discharge: 2023-07-14 | Disposition: A | Discharge status: Home / Self Care | Payer: No Typology Code available for payment source | Source: Ambulatory Visit | Attending: Vascular Surgery | Admitting: Vascular Surgery

## 2023-07-14 ENCOUNTER — Ambulatory Visit: Payer: No Typology Code available for payment source | Attending: Vascular Surgery | Admitting: Vascular Surgery

## 2023-07-14 ENCOUNTER — Ambulatory Visit (HOSPITAL_BASED_OUTPATIENT_CLINIC_OR_DEPARTMENT_OTHER)
Admission: RE | Admit: 2023-07-14 | Discharge: 2023-07-14 | Disposition: A | Discharge status: Home / Self Care | Payer: No Typology Code available for payment source | Source: Ambulatory Visit | Attending: Vascular Surgery | Admitting: Vascular Surgery

## 2023-07-14 ENCOUNTER — Encounter: Payer: Self-pay | Admitting: Vascular Surgery

## 2023-07-14 VITALS — BP 101/69 | HR 66 | Temp 98.3°F | Resp 20 | Ht 72.0 in | Wt 209.0 lb

## 2023-07-14 DIAGNOSIS — I7143 Infrarenal abdominal aortic aneurysm, without rupture: Secondary | ICD-10-CM

## 2023-07-14 DIAGNOSIS — M25661 Stiffness of right knee, not elsewhere classified: Secondary | ICD-10-CM | POA: Diagnosis not present

## 2023-07-14 DIAGNOSIS — I6523 Occlusion and stenosis of bilateral carotid arteries: Secondary | ICD-10-CM | POA: Insufficient documentation

## 2023-07-14 DIAGNOSIS — M25662 Stiffness of left knee, not elsewhere classified: Secondary | ICD-10-CM | POA: Diagnosis not present

## 2023-07-14 DIAGNOSIS — M25561 Pain in right knee: Secondary | ICD-10-CM | POA: Diagnosis not present

## 2023-07-14 DIAGNOSIS — M1711 Unilateral primary osteoarthritis, right knee: Secondary | ICD-10-CM | POA: Diagnosis not present

## 2023-07-14 DIAGNOSIS — M25562 Pain in left knee: Secondary | ICD-10-CM | POA: Diagnosis not present

## 2023-07-14 DIAGNOSIS — I7121 Aneurysm of the ascending aorta, without rupture: Secondary | ICD-10-CM

## 2023-07-14 DIAGNOSIS — R262 Difficulty in walking, not elsewhere classified: Secondary | ICD-10-CM | POA: Diagnosis not present

## 2023-07-14 DIAGNOSIS — M1712 Unilateral primary osteoarthritis, left knee: Secondary | ICD-10-CM | POA: Diagnosis not present

## 2023-07-15 DIAGNOSIS — M0579 Rheumatoid arthritis with rheumatoid factor of multiple sites without organ or systems involvement: Secondary | ICD-10-CM | POA: Diagnosis not present

## 2023-07-15 DIAGNOSIS — R2681 Unsteadiness on feet: Secondary | ICD-10-CM | POA: Diagnosis not present

## 2023-07-15 DIAGNOSIS — M47816 Spondylosis without myelopathy or radiculopathy, lumbar region: Secondary | ICD-10-CM | POA: Diagnosis not present

## 2023-07-17 ENCOUNTER — Telehealth: Payer: Self-pay | Admitting: Cardiology

## 2023-07-17 NOTE — Telephone Encounter (Signed)
 FYI Wife called back and said someone called left message that he needs appt. Wife said she doesn't know who this doctor is. I explained it was Dr Lavonne Prairie that the pt has seen. She said pt doesn't need appt.

## 2023-07-17 NOTE — Telephone Encounter (Signed)
 Sent message to scheduler  to make an appointment

## 2023-07-19 ENCOUNTER — Telehealth: Payer: Self-pay | Admitting: Gastroenterology

## 2023-07-19 NOTE — Telephone Encounter (Signed)
    Sonnie Dusky, PA-C 06/10/2023  9:46 AM EDT     CT abdomen/pelvis (06/04/2023) results noted, with particular attention to the following: - Mild splenomegaly, not significantly changed from previous, per radiology measurements. -Descending thoracic aortic aneurysm overall stable, results forwarded to patient's established CT surgeon (Dr. Luna Salinas) - Possible soft tissue thickening at base of cecum noted (correlating to the general location of previously noted PET/CT activity), results forwarded to patient's GI provider (Dr. Mordechai April & Azalee Lenz, PA-C), as they are planning for additional workup and colonoscopy.   Sonnie Dusky, PA-C 06/10/23 9:44 AM   FYI Sheril Dines, PA-C.  TAMMY R/TAMMY C: please let pt know and make referral I spoke to Dr. Mordechai April about this patient. The concern is his distal descending thoracic aortic aneurysm and diffuse aneurysmal dilatation of the abdominal aorta. Dr. Margrette Shield, [facility] anesthesiologist, states patient needs to have his colonoscopy at [facility], where there is multidisciplinary support if needed.  Not a candidate for procedure at [facility].   PLEASE MAKE REFERRAL TO [facility]: For ASAP colonoscopy due to abnormal PET/CT of cecum, NOT A CANDIDATE FOR COLONOSCOPY AT [facility]

## 2023-07-20 ENCOUNTER — Telehealth: Payer: Self-pay | Admitting: Gastroenterology

## 2023-07-20 ENCOUNTER — Other Ambulatory Visit: Payer: Self-pay | Admitting: *Deleted

## 2023-07-20 ENCOUNTER — Other Ambulatory Visit: Payer: Self-pay

## 2023-07-20 DIAGNOSIS — I7143 Infrarenal abdominal aortic aneurysm, without rupture: Secondary | ICD-10-CM

## 2023-07-20 DIAGNOSIS — R948 Abnormal results of function studies of other organs and systems: Secondary | ICD-10-CM

## 2023-07-20 NOTE — Telephone Encounter (Signed)
 Urgent referral sent

## 2023-07-20 NOTE — Telephone Encounter (Signed)
 Good morning Dr. General Kenner, These patient has a urgent referral for a colonoscopy to be done due to him having an abnormal PET scan of his colon. Patient was not a candidate to have a colonscopy done at [facility]. Referring provider is asking for an urgent requesting. Would you please advise on scheduling this patient.    Thank you.

## 2023-07-20 NOTE — Telephone Encounter (Signed)
 I have reviewed his chart. This is a logistic issue for him unfortunately in regards to him needing hospital procedure as an outpatient. To do this he would need a clinic visit with us  to discuss the procedure first and then to book a colonoscopy at the hospital. We don't have good access for a procedure in the hospital as outpatients right now, access is limited, he would be likely waiting a few months to get a procedure there at earliest. As such I don't think we can accommodate an urgent request for an outpatient procedure at the hospital right now, the only way to do that would have him be admitted which it does not sound like he needs. I'd recommend he may consider [facility] to see what their wait times are to do it sooner. If he doesn't mind waiting a few months we can see him here otherwise but agree that his colonoscopy should be done sooner than that if possible. Can you please let him know the situation, he can discuss further with his primary GI to get an opinion on where to do this otherwise if he doesn't want to try [facility]

## 2023-07-20 NOTE — Telephone Encounter (Signed)
 Lmom for pt to return call

## 2023-07-21 NOTE — Telephone Encounter (Signed)
Pt's wife was made aware and verbalized understanding.

## 2023-07-21 NOTE — Telephone Encounter (Signed)
 Lmom for pt to return call

## 2023-07-29 ENCOUNTER — Encounter: Payer: Self-pay | Admitting: Physician Assistant

## 2023-07-29 ENCOUNTER — Ambulatory Visit (INDEPENDENT_AMBULATORY_CARE_PROVIDER_SITE_OTHER): Admitting: Physician Assistant

## 2023-07-29 ENCOUNTER — Telehealth: Payer: Self-pay

## 2023-07-29 VITALS — BP 96/60 | HR 96

## 2023-07-29 DIAGNOSIS — R948 Abnormal results of function studies of other organs and systems: Secondary | ICD-10-CM | POA: Diagnosis not present

## 2023-07-29 DIAGNOSIS — R933 Abnormal findings on diagnostic imaging of other parts of digestive tract: Secondary | ICD-10-CM | POA: Diagnosis not present

## 2023-07-29 MED ORDER — NA SULFATE-K SULFATE-MG SULF 17.5-3.13-1.6 GM/177ML PO SOLN: 1.0000 | Freq: Once | ORAL | 0 refills | Status: AC

## 2023-07-29 NOTE — Telephone Encounter (Signed)
 [facility] Pre-operative Risk Assessment     Request for surgical clearance:     Endoscopy Procedure  What type of surgery is being performed?     Colonoscopy  When is this surgery scheduled?     09/23/23  What type of clearance is required ?   Cardiac/Vascular Clearance  Are there any medications that need to be held prior to surgery and how long? N/a  Practice name and name of physician performing surgery?      [facility]  What is your office phone and fax number?      Phone- [phone]  Fax- [phone]  Anesthesia type (None, local, MAC, general) ?       MAC   Please route your response to Lianne Redo, CMA

## 2023-07-29 NOTE — Progress Notes (Unsigned)
 Brigitte Canard, PA-C [address] [place], [place]  [address] Phone: [phone]   Gastroenterology Consultation  Referring Provider:     Galvin Jules, FNP Primary Care Physician:  Galvin Jules, FNP Primary Gastroenterologist:  Brigitte Canard, PA-C / Legrand Puma, MD  Reason for Consultation:     Abnormal PET scan of the Cecum / colon        HPI:   Austin Cox is a 78 y.o. y/o male referred for consultation & management  by Galvin Jules, FNP.    Patient saw GI Trudie Fuse. Harles Lied, MHS, PA-C at [facility] 05/18/33 to followup with Abnormal PET Scan and IDA.  Was Scheduled for followup Abdominal pelvic CT with contrast which showed possible soft tissue thickening in the cecum.  Dr. Mordechai April at [facility] recommended patient be referred to [facility] for ASAP colonoscopy in hospital at [facility].  There was concern about his distal descending thoracic aortic aneurysm.  Procedure needed anesthesia support in hospital.  He has mild occasional constipation.  No other GI symptoms.  He denies rectal bleeding, abdominal pain, or weight loss.  His Sister had colon Cancer.  Patient reports having one previous Colonoscopy done 15 years ago in [place], results are not available.   PET 12/2021 showed focus intense focal metabolic activity in the cecum at the level of the orifice of the appendix. He has had no further imaging of his abd/pelvis since that time. He cancelled GI appointment in 2023 to follow up on this finding.   06/04/23 Abd / Pelvic CT With Contrast:  1. Possible soft tissue thickening at the base of the cecum near origin of appendix, this measures 16 mm, appears to be in the general location of previously noted PET CT activity. Correlation with colonoscopy suggested if not already performed. 2. Splenomegaly. 3. 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 Distal  descending thoracic aortic aneurysm measuring up to 7.5 cm, previously 7.4 cm on CT angiography chest. Diffuse aneurysmal dilatation of the abdominal aorta as above. Recommend follow-up CT or MR as appropriate in 6 months and referral to or continued care with vascular specialist. (Ref.: J Vasc Surg. 2018; 67:2-77 and J Am Coll Radiol 2013;10(10):789-794.) 4. Incompletely visualized scrotal hydroceles.    PMH: AAA, CAD, history of coronary stent, carotid artery stenosis, HTN, thoracic aortic aneurysm, GERD, rheumatoid arthritis, thrombocytopenia, chronic pain, constipation, IDA.  Takes 81 Mg aspirin  daily.  No other blood thinners.  Follows with hematology for thrombocytopenia, mild splenomegaly without signs of lymphoma, anemia.  AAA is followed by Dr. Luna Salinas, CT surgeon.  Echo 02/2022 LVEF 50-55%.    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 Authorizing Provider  acetaminophen  (TYLENOL ) 650 MG CR tablet Take 650 mg by mouth every 8 (eight) hours as needed for pain.    [provider]  aspirin  81 MG EC tablet Take 81 mg by mouth daily.    [provider]  baclofen  (LIORESAL ) 10 MG tablet Take 10 mg by mouth 2 (two) times daily. 09/26/21   [provider]  Cholecalciferol  25 MCG (1000 UT) capsule  Take 1 capsule (1,000 Units total) by mouth daily. 03/28/21   Galvin Jules, FNP  clotrimazole  (LOTRIMIN ) 1 % cream APPLY TO AFFECTED AREA TWICE A DAY 12/24/22   Galvin Jules, FNP  diclofenac Sodium (VOLTAREN) 1 % GEL PLACE ONTO THE SKIN 4 (FOUR) TIMES A DAY AS NEEDED FOR UP TO 30 DAYS. 11/13/22   Galvin Jules, FNP  etanercept (ENBREL SURECLICK) 50 MG/ML injection Inject 50 mg into the skin every Friday. 11/08/19   [provider]  ferrous sulfate  325 (65 FE) MG EC tablet Take 1 tablet (325 mg total) by mouth daily with breakfast. 05/04/23   Sheril Dines M, PA-C  folic acid  (FOLVITE ) 1 MG tablet Take 1 mg by mouth daily. 09/23/19   [provider]  gabapentin  (NEURONTIN ) 300 MG capsule Take 300 mg by mouth 2 (two) times daily. 09/06/13   [provider]  methotrexate  (RHEUMATREX) 2.5 MG tablet Take 4 mg by mouth every Tuesday. 11/08/19   [provider]  metoprolol  succinate (TOPROL -XL) 25 MG 24 hr tablet Take 1 tablet (25 mg total) by mouth daily. 04/24/23   Galvin Jules, FNP  nitroGLYCERIN  (NITROSTAT ) 0.3 MG SL tablet Place 1 tablet (0.3 mg total) under the tongue every 5 (five) minutes as needed. 10/22/22   Galvin Jules, FNP  rosuvastatin  (CRESTOR ) 40 MG tablet Take 1 tablet (40 mg total) by mouth daily. 04/24/23   Galvin Jules, FNP  Saw Palmetto 450-15 MG CAPS Take 2 tablets by mouth daily.    [provider]  tamsulosin  (FLOMAX ) 0.4 MG CAPS capsule Take 1 capsule (0.4 mg total) by mouth at bedtime. 04/24/23   Galvin Jules, FNP  Tetrahydrozoline HCl (VISINE OP) Place 1 drop into both eyes daily as needed (dry eyes).    [provider]    Family History  Problem Relation Age of Onset   Cancer Mother        head neck cancer   Heart attack Father    Breast cancer Sister    Parkinson's disease Sister    Breast cancer Sister    Colon cancer Sister    Non-Hodgkin's lymphoma Daughter    Arthritis Son    Liver disease Neg Hx       Social History   Tobacco Use   Smoking status: Former    Current packs/day: 0.00    Average packs/day: 0.2 packs/day for 10.0 years (2.0 ttl pk-yrs)    Types: Cigarettes    Start date: 07/05/1964    Quit date: 07/06/1974    Years since quitting: 49.0   Smokeless tobacco: Never  Vaping Use   Vaping status: Never Used  Substance Use Topics   Alcohol  use: Not Currently    Alcohol /week: 1.0 standard drink of alcohol     Types: 1 Cans of beer per week   Drug use: Never    Allergies as of 07/29/2023   (No Known Allergies)    Review of Systems:  All systems reviewed and negative except where noted in HPI.   Physical Exam:  BP 96/60 (BP Location: Left Arm, Patient Position: Sitting, Cuff Size: Normal)   Pulse 96  No LMP for male patient.  General:   Feeble, elderly male; sitting in a wheelchair in NAD.  Decreased hearing.   Lungs:  Respirations even and unlabored.  Clear throughout to auscultation.   No wheezes, crackles, or rhonchi. No acute distress. Heart:  Regular rate and rhythm; no murmurs, clicks, rubs, or gallops. Abdomen:  Normal bowel sounds.  No bruits.  Soft, and non-distended without masses, hepatosplenomegaly or hernias noted.  No Tenderness.  No guarding or rebound tenderness.    Neurologic:  Alert and oriented x3;  wheelchair dependent.  Wearing bilateral knee braces. Psych:  Alert and cooperative. Depressed mood and affect.  Imaging Studies: VAS US  CAROTID Result Date: 07/14/2023 Carotid Arterial Duplex Study Patient Name:  BAVLY WIENCKOWSKI  Date of Exam:   07/14/2023 Medical Rec #: [id number]        Accession #:    [id number] Date of Birth: 07-24-1945         Patient Gender: M Patient Age:   26 years Exam Location:  [facility] Procedure:      VAS US  CAROTID Referring Phys: Stana Ear RAKES --------------------------------------------------------------------------------  Indications:       Left endarterectomy 04/15/21. Risk Factors:      Hypertension,  coronary artery disease. Comparison Study:  07/15/22 Performing Technologist: Aloma Arrant RVS  Examination Guidelines: A complete evaluation includes B-mode imaging, spectral Doppler, color Doppler, and power Doppler as needed of all accessible portions of each vessel. Bilateral testing is considered an integral part of a complete examination. Limited examinations for reoccurring indications may be performed as noted.  Right Carotid Findings: +----------+--------+--------+--------+------------------+--------+           PSV cm/sEDV cm/sStenosisPlaque DescriptionComments +----------+--------+--------+--------+------------------+--------+ CCA Prox  42      8               heterogenous               +----------+--------+--------+--------+------------------+--------+ CCA Distal44      11              heterogenous               +----------+--------+--------+--------+------------------+--------+ ICA Prox  71      24      1-39%   heterogenous               +----------+--------+--------+--------+------------------+--------+ ICA Mid   89      28                                         +----------+--------+--------+--------+------------------+--------+ ICA Distal64      22                                         +----------+--------+--------+--------+------------------+--------+ ECA       56      6                                          +----------+--------+--------+--------+------------------+--------+ +----------+--------+-------+--------+-------------------+  PSV cm/sEDV cmsDescribeArm Pressure (mmHG) +----------+--------+-------+--------+-------------------+ [id number]                                         +----------+--------+-------+--------+-------------------+ +---------+--------+--+--------+--+---------+ VertebralPSV cm/s42EDV cm/s14Antegrade +---------+--------+--+--------+--+---------+  Left Carotid Findings:  +----------+--------+--------+--------+------------------+--------+           PSV cm/sEDV cm/sStenosisPlaque DescriptionComments +----------+--------+--------+--------+------------------+--------+ CCA Prox  61      15              heterogenous               +----------+--------+--------+--------+------------------+--------+ CCA Distal47      9               heterogenous               +----------+--------+--------+--------+------------------+--------+ ICA Prox  62      22      1-39%   heterogenous               +----------+--------+--------+--------+------------------+--------+ ICA Mid   72      30                                         +----------+--------+--------+--------+------------------+--------+ ICA Distal57      15                                         +----------+--------+--------+--------+------------------+--------+ ECA       67      4                                          +----------+--------+--------+--------+------------------+--------+ +----------+--------+--------+--------+-------------------+           PSV cm/sEDV cm/sDescribeArm Pressure (mmHG) +----------+--------+--------+--------+-------------------+ [id number]      1                                   +----------+--------+--------+--------+-------------------+ +---------+--------+--+--------+--+---------+ VertebralPSV cm/s45EDV cm/s12Antegrade +---------+--------+--+--------+--+---------+   Summary: Right Carotid: Velocities in the right ICA are consistent with a 1-39% stenosis. Left Carotid: Velocities in the left ICA are consistent with a 1-39% stenosis. Vertebrals:  Bilateral vertebral arteries demonstrate antegrade flow. Subclavians: Normal flow hemodynamics were seen in bilateral subclavian              arteries. *See table(s) above for measurements and observations.  Electronically signed by Jimmye Moulds MD on 07/14/2023 at 10:52:59 AM.    Final    VAS US  AAA  DUPLEX Result Date: 07/14/2023 ABDOMINAL AORTA STUDY Patient Name:  DIONDRE BERGANZA  Date of Exam:   07/14/2023 Medical Rec #: [id number]        Accession #:    [id number] Date of Birth: 1946/01/04         Patient Gender: M Patient Age:   65 years Exam Location:  [facility] Procedure:      VAS US  AAA DUPLEX Referring Phys: Stana Ear RAKES --------------------------------------------------------------------------------  Indications: Follow up exam for known AAA. Limitations: Air/bowel gas.  Comparison Study: 02/10/23 Performing Technologist: Aloma Arrant RVS  Examination Guidelines: A  complete evaluation includes B-mode imaging, spectral Doppler, color Doppler, and power Doppler as needed of all accessible portions of each vessel. Bilateral testing is considered an integral part of a complete examination. Limited examinations for reoccurring indications may be performed as noted.  Abdominal Aorta Findings: +-----------+-------+----------+----------+--------+--------+--------+ Location   AP (cm)Trans (cm)PSV (cm/s)WaveformThrombusComments +-----------+-------+----------+----------+--------+--------+--------+ Proximal   4.63   5.00      61                                 +-----------+-------+----------+----------+--------+--------+--------+ Mid        4.29   4.23      23                                 +-----------+-------+----------+----------+--------+--------+--------+ Distal     3.32   3.79                                         +-----------+-------+----------+----------+--------+--------+--------+ RT CIA Prox1.5    1.5       101                                +-----------+-------+----------+----------+--------+--------+--------+ LT CIA Prox2.0    2.1       48                                 +-----------+-------+----------+----------+--------+--------+--------+ Visualization of the Proximal Abdominal Aorta was limited.  Summary: Abdominal Aorta: There  is evidence of abnormal dilatation of the proximal and mid Abdominal aorta. The largest aortic measurement is 5.0 cm. The largest aortic diameter has increased compared to prior exam. Previous diameter measurement was 4.3 cm obtained on 02/10/23.  *See table(s) above for measurements and observations.  Electronically signed by Jimmye Moulds MD on 07/14/2023 at 10:52:40 AM.    Final     Labs: CBC    Component Value Date/Time   WBC 4.6 06/25/2023 1429   RBC 5.08 06/25/2023 1429   HGB 13.4 06/25/2023 1429   HGB 12.1 (L) 04/24/2023 0924   HCT 42.6 06/25/2023 1429   HCT 39.3 04/24/2023 0924   PLT 52 (L) 06/25/2023 1429   PLT 58 (LL) 04/24/2023 0924   MCV 83.9 06/25/2023 1429   MCV 81 04/24/2023 0924   MCH 26.4 06/25/2023 1429   MCHC 31.5 06/25/2023 1429   RDW 19.4 (H) 06/25/2023 1429   RDW 17.0 (H) 04/24/2023 0924   LYMPHSABS 1.0 06/25/2023 1429   LYMPHSABS 1.1 04/24/2023 0924   MONOABS 1.3 (H) 06/25/2023 1429   EOSABS 0.0 06/25/2023 1429   EOSABS 0.0 04/24/2023 0924   BASOSABS 0.0 06/25/2023 1429   BASOSABS 0.0 04/24/2023 0924    CMP     Component Value Date/Time   NA 134 (L) 05/01/2023 1141   NA 138 04/24/2023 0924   K 4.0 05/01/2023 1141   CL 102 05/01/2023 1141   CO2 26 05/01/2023 1141   GLUCOSE 81 05/01/2023 1141   BUN 19 05/01/2023 1141   BUN 11 04/24/2023 0924   CREATININE 1.21 05/01/2023 1141   CALCIUM  9.4 05/01/2023 1141   PROT 7.2 05/01/2023 1141   PROT 6.9 04/24/2023 0924  ALBUMIN  3.7 05/01/2023 1141   ALBUMIN  4.1 04/24/2023 0924   AST 19 05/01/2023 1141   ALT 17 05/01/2023 1141   ALKPHOS 53 05/01/2023 1141   BILITOT 1.0 05/01/2023 1141   BILITOT 1.0 04/24/2023 0924   GFRNONAA >60 05/01/2023 1141   GFRAA  09/16/2007 1600    >60        The eGFR has been calculated using the MDRD equation. This calculation has not been validated in all clinical    Assessment and Plan:   TEDRICK RAINA is a 78 y.o. y/o male has been referred for:   1.  Abnormal  PET and CT scan of the cecum.  Rule out colon neoplasm.  2.  Multiple comorbidities:  AAA, CAD, history of coronary stent, carotid artery stenosis, HTN, thoracic aortic aneurysm, GERD, rheumatoid arthritis, thrombocytopenia, chronic pain, constipation, IDA.  Takes 81 Mg aspirin  daily.  Follows with hematology for thrombocytopenia, mild splenomegaly without signs of lymphoma, anemia.  AAA is followed by Dr. Luna Salinas, CT surgeon.  Plan: - Schedule Colonoscopy in hospital under anesthesia support. - Obtain cardiovascular clearance for colonoscopy procedure from Dr. Luna Salinas. - I discussed risks of colonoscopy with patient to include risk of bleeding, colon perforation, and risk of sedation.  Patient expressed understanding and agrees to proceed with colonoscopy.   Follow up As Needed based on Colonoscopy results.  Brigitte Canard, PA-C

## 2023-07-29 NOTE — Telephone Encounter (Signed)
   Name: Austin Cox  DOB: 05-21-1945  MRN: [id number]  Primary Cardiologist: None  Chart reviewed as part of pre-operative protocol coverage. Because of Rhone R Neira's past medical history and time since last visit, he will require a follow-up in-office visit in order to better assess preoperative cardiovascular risk.  Pre-op covering staff: - Please schedule appointment and call patient to inform them. If patient already had an upcoming appointment within acceptable timeframe, please add "pre-op clearance" to the appointment notes so provider is aware. - Please contact requesting surgeon's office via preferred method (i.e, phone, fax) to inform them of need for appointment prior to surgery.  No medications requested to be held.   Ava Boatman, NP  07/29/2023, 4:52 PM

## 2023-07-29 NOTE — Telephone Encounter (Signed)
 Pt has been scheduled in office 08/19/23 with DR. Hochrein in the [place] office for preop clearance. I will update all parties involved pt has appt.

## 2023-07-29 NOTE — Patient Instructions (Addendum)
 You have been scheduled for a colonoscopy. Please follow written instructions given to you at your visit today.   If you use inhalers (even only as needed), please bring them with you on the day of your procedure.  DO NOT TAKE 7 DAYS PRIOR TO TEST- Trulicity (dulaglutide) Ozempic, Wegovy (semaglutide) Mounjaro (tirzepatide) Bydureon Bcise (exanatide extended release)  DO NOT TAKE 1 DAY PRIOR TO YOUR TEST Rybelsus (semaglutide) Adlyxin (lixisenatide) Victoza (liraglutide) Byetta (exanatide) ___________________________________________________________________________  Please follow up sooner if symptoms increase or worsen   Due to recent changes in healthcare laws, you may see the results of your imaging and laboratory studies on MyChart before your provider has had a chance to review them.  We understand that in some cases there may be results that are confusing or concerning to you. Not all laboratory results come back in the same time frame and the provider may be waiting for multiple results in order to interpret others.  Please give us  48 hours in order for your provider to thoroughly review all the results before contacting the office for clarification of your results.   _______________________________________________________  If your blood pressure at your visit was 140/90 or greater, please contact your primary care physician to follow up on this.  _______________________________________________________  If you are age 80 or older, your body mass index should be between 23-30. Your There is no height or weight on file to calculate BMI. If this is out of the aforementioned range listed, please consider follow up with your Primary Care Provider.  If you are age 74 or younger, your body mass index should be between 19-25. Your There is no height or weight on file to calculate BMI. If this is out of the aformentioned range listed, please consider follow up with your Primary Care  Provider.   ________________________________________________________  The [facility] providers would like to encourage you to use MYCHART to communicate with providers for non-urgent requests or questions.  Due to long hold times on the telephone, sending your provider a message by [facility] may be a faster and more efficient way to get a response.  Please allow 48 business hours for a response.  Please remember that this is for non-urgent requests.  _______________________________________________________ Thank you for trusting me with your gastrointestinal care!   Brigitte Canard, PA

## 2023-07-30 ENCOUNTER — Encounter: Payer: Self-pay | Admitting: Physician Assistant

## 2023-08-04 NOTE — Progress Notes (Signed)
 Austin Cox, PLEASE make sure that this patient has been cleared by cardiovascular surgery to undergo colonoscopy, given the size of his aneurysm. Thank you, Dr. Elvin Hammer

## 2023-08-06 ENCOUNTER — Telehealth: Payer: Self-pay | Admitting: *Deleted

## 2023-08-06 ENCOUNTER — Ambulatory Visit (HOSPITAL_COMMUNITY)
Admission: RE | Admit: 2023-08-06 | Discharge: 2023-08-06 | Disposition: A | Discharge status: Home / Self Care | Source: Ambulatory Visit | Attending: Vascular Surgery | Admitting: Vascular Surgery

## 2023-08-06 DIAGNOSIS — I7143 Infrarenal abdominal aortic aneurysm, without rupture: Secondary | ICD-10-CM | POA: Insufficient documentation

## 2023-08-06 DIAGNOSIS — I716 Thoracoabdominal aortic aneurysm, without rupture, unspecified: Secondary | ICD-10-CM | POA: Diagnosis not present

## 2023-08-06 DIAGNOSIS — I7123 Aneurysm of the descending thoracic aorta, without rupture: Secondary | ICD-10-CM | POA: Diagnosis not present

## 2023-08-06 DIAGNOSIS — I7121 Aneurysm of the ascending aorta, without rupture: Secondary | ICD-10-CM | POA: Diagnosis not present

## 2023-08-06 MED ORDER — IOHEXOL 350 MG/ML SOLN
100.0000 mL | Freq: Once | INTRAVENOUS | Status: AC | PRN
  Administered 2023-08-06: 100 mL via INTRAVENOUS

## 2023-08-06 NOTE — Telephone Encounter (Signed)
  Austin Cox 1946-03-05 [id number]  08/06/23   Dear Dr Fulton Job:  We have scheduled the above named patient for a colonoscopy procedure on 09/23/23 . Our records indicate that he is followed by your office for abdominal aortic aneurysm and carotid stenosis  Please advise as to whether the patient is cleared from vascular surgery perspective to move forward with the scheduled colonoscopy.  Please route your response to Rudi Corwin, RN or fax your response to [phone].  Sincerely,    [facility]

## 2023-08-06 NOTE — Telephone Encounter (Signed)
===  View-only below this line=== ----- Message ----- From: Young Hensen, MD Sent: 08/06/2023  10:10 AM EDT To: Glennette Lanius, RN  Ok to proceed.

## 2023-08-06 NOTE — Telephone Encounter (Signed)
 Noted! Thank you

## 2023-08-11 ENCOUNTER — Telehealth: Payer: Self-pay

## 2023-08-11 NOTE — Telephone Encounter (Signed)
 [facility] Pre-operative Risk Assessment     Request for surgical clearance:     Endoscopy Procedure  What type of surgery is being performed?     Colonoscopy  When is this surgery scheduled?     09/23/23  What type of clearance is required ?   Cardiac  Are there any medications that need to be held prior to surgery and how long? n/a  Practice name and name of physician performing surgery?      [facility]  What is your office phone and fax number?      Phone- [phone]  Fax- [phone]  Anesthesia type (None, local, MAC, general) ?       MAC   Please route your response to Lianne Redo, CMA

## 2023-08-11 NOTE — Telephone Encounter (Signed)
  Austin Cox 07/12/1945 [id number]    Dear Fulton Job, Marine Sia, MD :  We have scheduled the above named patient for a(n) Colonoscopy procedure. Our records show that he has recently had a stent put in his heart.  Please advise as to whether the patient may have their procedure which is scheduled for 09/23/23.  Please route your response to Lianne Redo, CMA or fax response to [phone].  Sincerely,    [facility]

## 2023-08-15 DIAGNOSIS — R2681 Unsteadiness on feet: Secondary | ICD-10-CM | POA: Diagnosis not present

## 2023-08-15 DIAGNOSIS — M0579 Rheumatoid arthritis with rheumatoid factor of multiple sites without organ or systems involvement: Secondary | ICD-10-CM | POA: Diagnosis not present

## 2023-08-15 DIAGNOSIS — M47816 Spondylosis without myelopathy or radiculopathy, lumbar region: Secondary | ICD-10-CM | POA: Diagnosis not present

## 2023-08-17 DIAGNOSIS — I10 Essential (primary) hypertension: Secondary | ICD-10-CM | POA: Insufficient documentation

## 2023-08-17 NOTE — Progress Notes (Unsigned)
 Cardiology Office Note:   Date:  08/19/2023  ID:  Austin Cox, DOB 07/25/1945, MRN [id number] PCP: Galvin Jules, FNP  [facility] Providers Cardiologist:  Eilleen Grates, MD {  History of Present Illness:   Austin Cox is a 78 y.o. male who presents for evaluation of hypertension, dyslipidemia and family history of CAD.   The patient has a history of stent x 2 and was followed at [facility].   I was able to find records from [place].  His cardiac catheterization in 2016 was electively by that report.  Apparently was for an abnormal EKG.  He had nonobstructive disease with 50% stenosis in the LAD.  There were 2 obtuse marginals with the first being subtotally occluded in the second 60 to 70% stenosis.  The right coronary artery had 70% stenosis.   Since I last saw him he has continued to be followed by Dr. Fulton Job.  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  The distal aortic arch is 5.7 cm aneurysm with distal descending thoracic aorta measuring 6.5.  He was thought to be a high risk surgical candidate but might be for percutaneous treatment.  He saw Dr. F[place]on Job in April and is seeing him again soon.  He is somewhat limited in his activities and frail for his age.  He gets around slowly with a walker or a walking stick.  He is not describing any new chest pressure, neck or arm discomfort.  He is not describing any new shortness of breath, PND or orthopnea.  He has occasional palpitations.  He has had no presyncope or syncope.   ROS: As stated in the HPI and negative for all other systems.  Studies Reviewed:    EKG:   EKG Interpretation Date/Time:  Wednesday August 19 2023 12:13:54 EDT Ventricular Rate:  70 PR Interval:  224 QRS Duration:  114 QT Interval:  390 QTC Calculation: 421 R Axis:   -54  Text Interpretation: Sinus rhythm with 1st degree A-V block Left axis deviation Inferior infarct , age undetermined When compared with ECG of 29-Mar-2022 08:30, No  significant change since last tracing Confirmed by Eilleen Grates ([id number]) on 08/19/2023 12:30:33 PM    Risk Assessment/Calculations:              Physical Exam:   VS:  BP 92/60   Pulse 70   Ht 6\' 1"  (1.854 m)   Wt 201 lb (91.2 kg)   BMI 26.52 kg/m    Wt Readings from Last 3 Encounters:  08/19/23 201 lb (91.2 kg)  07/14/23 209 lb (94.8 kg)  07/01/23 209 lb (94.8 kg)     GEN: Well nourished, well developed in no acute distress NECK: No JVD; No carotid bruits CARDIAC: RRR, 3/6 apical systolic murmur radiating slightly at the aortic outflow tract, no diastolic murmurs, rubs, gallops RESPIRATORY:  Clear to auscultation without rales, wheezing or rhonchi  ABDOMEN: Soft, non-tender, non-distended EXTREMITIES:  No edema; No deformity   ASSESSMENT AND PLAN:   Carotid stenosis:   He is up-to-date with follow-up with this and has stable endarterectomy.  HTN: Patient's blood pressure is actually running slightly low.  He is not really on any any significant medications to exacerbate this.  I will continue the low-dose of beta-blocker.  He is not having any symptoms so no further management is necessary.  CAD:   He has no new cardiovascular symptoms.  He has coronary disease as described no further testing  is necessary at this time.  He needs continued risk reduction.  PVD:   He has been followed again closely by Dr. Fulton Job as above.   Dyslipidemia: LDL was 32 in February.  No change in therapy.     Follow up with me in 1 year or sooner if needed.  Signed, Eilleen Grates, MD

## 2023-08-19 ENCOUNTER — Encounter: Payer: Self-pay | Admitting: Cardiology

## 2023-08-19 ENCOUNTER — Ambulatory Visit (INDEPENDENT_AMBULATORY_CARE_PROVIDER_SITE_OTHER): Admitting: Cardiology

## 2023-08-19 VITALS — BP 92/60 | HR 70 | Ht 73.0 in | Wt 201.0 lb

## 2023-08-19 DIAGNOSIS — I2510 Atherosclerotic heart disease of native coronary artery without angina pectoris: Secondary | ICD-10-CM | POA: Diagnosis not present

## 2023-08-19 DIAGNOSIS — I10 Essential (primary) hypertension: Secondary | ICD-10-CM | POA: Diagnosis not present

## 2023-08-19 DIAGNOSIS — I6529 Occlusion and stenosis of unspecified carotid artery: Secondary | ICD-10-CM

## 2023-08-19 DIAGNOSIS — I739 Peripheral vascular disease, unspecified: Secondary | ICD-10-CM

## 2023-08-19 NOTE — Patient Instructions (Signed)
 Medication Instructions:  Your physician recommends that you continue on your current medications as directed. Please refer to the Current Medication list given to you today.  *If you need a refill on your cardiac medications before your next appointment, please call your pharmacy*  Lab Work: NONE   If you have labs (blood work) drawn today and your tests are completely normal, you will receive your results only by: MyChart Message (if you have MyChart) OR A paper copy in the mail If you have any lab test that is abnormal or we need to change your treatment, we will call you to review the results.  Testing/Procedures: NONE   Follow-Up: At [facility], you and your health needs are our priority.  As part of our continuing mission to provide you with exceptional heart care, our providers are all part of one team.  This team includes your primary Cardiologist (physician) and Advanced Practice Providers or APPs (Physician Assistants and Nurse Practitioners) who all work together to provide you with the care you need, when you need it.  Your next appointment:   1 year(s)  Provider:   Eilleen Grates, MD    We recommend signing up for the patient portal called "MyChart".  Sign up information is provided on this After Visit Summary.  MyChart is used to connect with patients for Virtual Visits (Telemedicine).  Patients are able to view lab/test results, encounter notes, upcoming appointments, etc.  Non-urgent messages can be sent to your provider as well.   To learn more about what you can do with MyChart, go to [email or web address].   Other Instructions Thank you for choosing [facility]!

## 2023-08-25 ENCOUNTER — Ambulatory Visit: Attending: Vascular Surgery | Admitting: Vascular Surgery

## 2023-08-25 ENCOUNTER — Encounter: Payer: Self-pay | Admitting: Vascular Surgery

## 2023-08-25 VITALS — BP 95/61 | HR 82 | Temp 98.6°F | Resp 20 | Ht 73.0 in | Wt 201.0 lb

## 2023-08-25 DIAGNOSIS — I7123 Aneurysm of the descending thoracic aorta, without rupture: Secondary | ICD-10-CM | POA: Insufficient documentation

## 2023-08-25 DIAGNOSIS — I7143 Infrarenal abdominal aortic aneurysm, without rupture: Secondary | ICD-10-CM

## 2023-08-25 DIAGNOSIS — I7121 Aneurysm of the ascending aorta, without rupture: Secondary | ICD-10-CM

## 2023-08-25 NOTE — Progress Notes (Signed)
 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 male with history of coronary artery disease status post PCI, rheumatoid arthritis, pancytopenia with moderate thrombocytopenia, carotid stenosis presents for follow-up after CTA for discussion of thoracoabdominal aneurysm.  He has had a left carotid endarterectomy on 04/15/2021 for an asymptomatic high-grade stenosis with string sign.  Postop course was complicated by a hematoma that was managed with observation.  He does not smoke and quit in the 1980's.   He is also followed by Dr. 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  On last evaluation both his ascending and descending thoracic aneurysm met size criteria for repair but he had prohibitive risk factors for surgery including his thrombocytopenia with pancytopenia.   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    Past Surgical History:  Procedure Laterality Date   CATARACT EXTRACTION     ENDARTERECTOMY Left 04/15/2021   Procedure: LEFT CAROTID ENDARTERECTOMY WITH;  Surgeon: Young Hensen, MD;  Location: [facility] OR;  Service: Vascular;  Laterality: Left;   EYE SURGERY     detached retina right eye   FRACTURE SURGERY     right leg   Stent in heart      Family History  Problem Relation Age of Onset   Cancer Mother        head neck cancer   Heart attack Father    Breast cancer Sister    Parkinson's disease Sister    Breast cancer Sister    Colon cancer Sister    Non-Hodgkin's lymphoma  Daughter    Arthritis Son    Liver disease Neg Hx     SOCIAL HISTORY: Social History   Tobacco Use   Smoking status: Former    Current packs/day: 0.00    Average packs/day: 0.2 packs/day for 10.0 years (2.0 ttl pk-yrs)    Types: Cigarettes    Start date: 07/05/1964    Quit date: 07/06/1974    Years since quitting: 49.1   Smokeless tobacco: Never  Substance Use Topics   Alcohol  use: Not Currently    Alcohol /week: 1.0 standard drink of alcohol     Types: 1 Cans of beer per week    No Known Allergies  Current Outpatient Medications  Medication Sig Dispense Refill   acetaminophen  (TYLENOL ) 650 MG CR tablet Take 650 mg by mouth every 8 (eight) hours as needed for pain.     aspirin  81 MG EC tablet Take 81 mg by mouth daily.     baclofen  (LIORESAL ) 10 MG tablet Take 10 mg by mouth 2 (two) times daily.     Cholecalciferol  25 MCG (1000 UT) capsule Take 1 capsule (1,000 Units total) by mouth daily. 90 capsule 2   clotrimazole  (LOTRIMIN ) 1 % cream APPLY TO AFFECTED AREA TWICE A DAY 60 g 1   diclofenac Sodium (VOLTAREN) 1 % GEL PLACE ONTO THE SKIN 4 (FOUR) TIMES A DAY AS NEEDED FOR UP TO 30 DAYS. 100 g 1   etanercept (  ENBREL SURECLICK) 50 MG/ML injection Inject 50 mg into the skin every Friday.     ferrous sulfate  325 (65 FE) MG EC tablet Take 1 tablet (325 mg total) by mouth daily with breakfast. 90 tablet 3   folic acid  (FOLVITE ) 1 MG tablet Take 1 mg by mouth daily.     gabapentin  (NEURONTIN ) 300 MG capsule Take 300 mg by mouth 2 (two) times daily.     methotrexate  (RHEUMATREX) 2.5 MG tablet Take 4 mg by mouth every Tuesday.     metoprolol  succinate (TOPROL -XL) 25 MG 24 hr tablet Take 1 tablet (25 mg total) by mouth daily. 100 tablet 3   nitroGLYCERIN  (NITROSTAT ) 0.3 MG SL tablet Place 1 tablet (0.3 mg total) under the tongue every 5 (five) minutes as needed. 90 tablet 0   rosuvastatin  (CRESTOR ) 40 MG tablet Take 1 tablet (40 mg total) by mouth daily. 90 tablet 3   Saw Palmetto 450-15 MG  CAPS Take 2 tablets by mouth daily.     tamsulosin  (FLOMAX ) 0.4 MG CAPS capsule Take 1 capsule (0.4 mg total) by mouth at bedtime. 90 capsule 3   Tetrahydrozoline HCl (VISINE OP) Place 1 drop into both eyes daily as needed (dry eyes).     No current facility-administered medications for this visit.    REVIEW OF SYSTEMS:  [X]  denotes positive finding, [ ]  denotes negative finding Cardiac  Comments:  Chest pain or chest pressure:    Shortness of breath upon exertion:    Short of breath when lying flat:    Irregular heart rhythm:        Vascular    Pain in calf, thigh, or hip brought on by ambulation:    Pain in feet at night that wakes you up from your sleep:     Blood clot in your veins:    Leg swelling:         Pulmonary    Oxygen at home:    Productive cough:     Wheezing:         Neurologic    Sudden weakness in arms or legs:     Sudden numbness in arms or legs:     Sudden onset of difficulty speaking or slurred speech:    Temporary loss of vision in one eye:     Problems with dizziness:         Gastrointestinal    Blood in stool:     Vomited blood:         Genitourinary    Burning when urinating:     Blood in urine:        Psychiatric    Major depression:         Hematologic    Bleeding problems:    Problems with blood clotting too easily:        Skin    Rashes or ulcers:        Constitutional    Fever or chills:      PHYSICAL EXAM: Vitals:   08/25/23 1503  BP: 95/61  Pulse: 82  Resp: 20  Temp: 98.6 F (37 C)  TempSrc: Temporal  SpO2: 95%  Weight: 201 lb (91.2 kg)  Height: 6\' 1"  (1.854 m)     GENERAL: The patient is a well-nourished male, in no acute distress. The vital signs are documented above. CARDIAC: There is a regular rate and rhythm.  VASCULAR:  Left neck incision well-healed from previous carotid endarterectomy PULMONARY: No respiratory distress. ABDOMEN: Soft and  non-tender.  No pain with deep palpation. MUSCULOSKELETAL: There  are no major deformities or cyanosis. NEUROLOGIC: No focal weakness or paresthesias are detected.  Cranial nerves II through XII grossly intact SKIN: There are no ulcers or rashes noted. PSYCHIATRIC: The patient has a normal affect.  DATA:   CTA reviewed 08/06/2023 with similar appearance of thoracoabdominal aneurysm including 7 cm distal descending thoracic into the visceral segment where abdominal aortic component as well  AAA duplex 07/14/23 shows 5.0 cm AAA (previously 4.3 cm)      Carotid ultrasound 07/14/23 with minimal 1 to 39% carotid stenosis bilaterally   Assessment/Plan:  78 year old male well-known to our practice that has previously undergone a left carotid endarterectomy on 04/15/2021 for an asymptomatic high-grade stenosis.    He is also being followed by CT surgery for a 6.4 cm ascending aneurysm and 7.4 cm descending aneurysm.  He is felt to be high risk due to his comorbidities.  Previously not felt to be a surgery candidate with Dr. Luna Salinas due to his risk factors.   He presents for follow-up after CTA.  Discussed today that his CTA does show a greater than 7 cm descending thoracic aneurysm as noted on prior scans.  No significant interval change.   This certainly meets criteria for repair given rupture risk. I did discuss there is a newer device on the market called a TAMBE that would potentially be an option requiring thoracic stenting in addition to the modified branch endoprosthesis for stenting the celiac artery, SMA artery, bilateral renal arteries.  I discussed this at significantly higher comorbidities that could include vessel injury including renal failure, mesenteric ischemia, stroke risk and even paralysis and death.  Ultimately he needs some time to think about his options which is understandable.  I think he is leaning away from surgery and I understand his concern.  Certainly high risk given his thrombocytopenia as well.  I will do a phone visit with him in  several weeks.  I did discuss this will not address his ascending aortic aneurysm.  Would need cardiology clearance from Dr. Lavonne Prairie if he decides to proceed.  Young Hensen, MD [facility] Office: [phone]

## 2023-09-08 ENCOUNTER — Ambulatory Visit: Admitting: Vascular Surgery

## 2023-09-08 DIAGNOSIS — I7123 Aneurysm of the descending thoracic aorta, without rupture: Secondary | ICD-10-CM

## 2023-09-08 DIAGNOSIS — I7143 Infrarenal abdominal aortic aneurysm, without rupture: Secondary | ICD-10-CM

## 2023-09-08 NOTE — Progress Notes (Signed)
 Unable to contact patient for phone visit after multiple attempts.

## 2023-09-10 ENCOUNTER — Ambulatory Visit: Payer: Self-pay

## 2023-09-10 ENCOUNTER — Telehealth: Payer: Self-pay | Admitting: Physician Assistant

## 2023-09-10 NOTE — Telephone Encounter (Signed)
 Patient called, left VM to return the call to the office to speak to NT.         Copied from CRM [id number]. Topic: Clinical - Medical Advice >> Sep 10, 2023 10:21 AM Montie POUR wrote: Reason for CRM: Richad's wife wants a nurse to call her to discuss Kristan getting a cortisone shot in his right knee. He is having pain in his right knee. Please call Clarita at [phone].  Thanks

## 2023-09-10 NOTE — Telephone Encounter (Signed)
 Apt scheduled.

## 2023-09-10 NOTE — Telephone Encounter (Signed)
 FYI Only or Action Required?: FYI only for provider.  Patient was last seen in primary care on 04/24/2023 by Severa Rock HERO, FNP. Called Nurse Triage reporting Knee Pain. Symptoms began several days ago. Interventions attempted: OTC medications: tylenol . Symptoms are: gradually worsening.  Triage Disposition: See PCP When Office is Open (Within 3 Days)  Patient/caregiver understands and will follow disposition?: Yes             Reason for Disposition  [1] Swollen joint AND [2] no fever or redness  Answer Assessment - Initial Assessment Questions 1. LOCATION and RADIATION: Where is the pain located?      Right knee 2. QUALITY: What does the pain feel like?  (e.g., sharp, dull, aching, burning)     arthritis 3. SEVERITY: How bad is the pain? What does it keep you from doing?   (Scale 1-10; or mild, moderate, severe)   -  MILD (1-3): doesn't interfere with normal activities    -  MODERATE (4-7): interferes with normal activities (e.g., work or school) or awakens from sleep, limping    -  SEVERE (8-10): excruciating pain, unable to do any normal activities, unable to walk     Mild pain 4. ONSET: When did the pain start? Does it come and go, or is it there all the time?     3-4 days 5. RECURRENT: Have you had this pain before? If Yes, ask: When, and what happened then?     yes 6. SETTING: Has there been any recent work, exercise or other activity that involved that part of the body?      no 7. AGGRAVATING FACTORS: What makes the knee pain worse? (e.g., walking, climbing stairs, running)     walking 8. ASSOCIATED SYMPTOMS: Is there any swelling or redness of the knee?     swelling 9. OTHER SYMPTOMS: Do you have any other symptoms? (e.g., chest pain, difficulty breathing, fever, calf pain)     no  Protocols used: Knee Pain-A-AH

## 2023-09-10 NOTE — Telephone Encounter (Signed)
 rInbound call from patient's wife requesting to cancel 7/9 colonoscopy. States patient is not doing well. Please advise, thank you.

## 2023-09-11 ENCOUNTER — Encounter: Payer: Self-pay | Admitting: Family Medicine

## 2023-09-11 ENCOUNTER — Ambulatory Visit (INDEPENDENT_AMBULATORY_CARE_PROVIDER_SITE_OTHER): Admitting: Family Medicine

## 2023-09-11 VITALS — BP 112/72 | HR 79 | Temp 99.1°F | Ht 73.0 in | Wt 201.4 lb

## 2023-09-11 DIAGNOSIS — J069 Acute upper respiratory infection, unspecified: Secondary | ICD-10-CM

## 2023-09-11 DIAGNOSIS — M25562 Pain in left knee: Secondary | ICD-10-CM

## 2023-09-11 DIAGNOSIS — M25462 Effusion, left knee: Secondary | ICD-10-CM | POA: Diagnosis not present

## 2023-09-11 DIAGNOSIS — R062 Wheezing: Secondary | ICD-10-CM | POA: Diagnosis not present

## 2023-09-11 DIAGNOSIS — G8929 Other chronic pain: Secondary | ICD-10-CM

## 2023-09-11 MED ORDER — PREDNISONE 20 MG PO TABS: 40.0000 mg | ORAL_TABLET | Freq: Every day | ORAL | 0 refills | Status: AC

## 2023-09-11 NOTE — Progress Notes (Signed)
 Acute Office Visit  Subjective:     Patient ID: Austin Cox, male    DOB: 05-22-1945, 78 y.o.   MRN: [id number]  Chief Complaint  Patient presents with   Knee Pain    HPI Here with wife today. Patient is in today for left knee pain x 1 week. Pain is intermittent, worse with movement, weight bearing. Knee is mildly swollen. He has had fluid drained from the knee before. Aspirate was ? Gout. He was going to Dr. Larose at [facility] in [place] for gel injections. He had 2 of 3 completed. Last done in March. Didn't follow up for the 3rd injection as injections were costly and only helped for 1-2 weeks. He has been applying ice, taking tylenol , and wearing a brace without improvement so far.   Cough and congestion x 3 days. Clear phlegm. Intermittent wheezing. Denies shortness of breath, fever, sore throat, chills, chest pain. Taking mucinex  without relief.   ROS As per HPI.     Objective:    BP 112/72   Pulse 79   Temp 99.1 F (37.3 C) (Temporal)   Ht 6' 1 (1.854 m)   Wt 201 lb 6.4 oz (91.4 kg)   SpO2 96%   BMI 26.57 kg/m    Physical Exam Vitals and nursing note reviewed.  Constitutional:      General: He is not in acute distress.    Appearance: He is not ill-appearing, toxic-appearing or diaphoretic.  HENT:     Head: Normocephalic and atraumatic.     Right Ear: Tympanic membrane, ear canal and external ear normal.     Left Ear: Tympanic membrane, ear canal and external ear normal.     Nose: Congestion present.     Mouth/Throat:     Mouth: Mucous membranes are moist.     Pharynx: Oropharynx is clear. No oropharyngeal exudate or posterior oropharyngeal erythema.   Eyes:     General:        Right eye: No discharge.        Left eye: No discharge.     Conjunctiva/sclera: Conjunctivae normal.    Cardiovascular:     Rate and Rhythm: Normal rate and regular rhythm.     Heart sounds: Normal heart sounds. No murmur heard. Pulmonary:     Effort:  Pulmonary effort is normal. No respiratory distress.     Breath sounds: Normal breath sounds. No wheezing, rhonchi or rales.  Chest:     Chest wall: No tenderness.   Musculoskeletal:     Left knee: Effusion (mild effusion) present. No erythema or ecchymosis. No tenderness. Normal alignment and normal patellar mobility.     Right lower leg: No edema.     Left lower leg: No edema.   Skin:    General: Skin is warm and dry.   Neurological:     Mental Status: He is alert and oriented to person, place, and time. Mental status is at baseline.   Psychiatric:        Mood and Affect: Mood normal.        Behavior: Behavior normal.     No results found for any visits on 09/11/23.      Assessment & Plan:   Austin Cox was seen today for knee pain.  Diagnoses and all orders for this visit:  Wheezing Acute URI Prednisone  as below for wheezing. Lungs clear on exam. Discussed symptomatic care and return precautions.  -     predniSONE  (DELTASONE )  20 MG tablet; Take 2 tablets (40 mg total) by mouth daily with breakfast for 5 days.  Chronic pain of left knee Knee effusion, left Increase pain x 1 week. Discussed prednisone  burst may help with pain/swelling. Continue ice, bracing, voltaren gel. Recommended follow up with ortho.    Return to office for new or worsening symptoms, or if symptoms persist.   The patient indicates understanding of these issues and agrees with the plan.   Austin CHRISTELLA Search, FNP

## 2023-09-11 NOTE — Telephone Encounter (Signed)
Left message for pts wife to call back. 

## 2023-09-14 DIAGNOSIS — R2681 Unsteadiness on feet: Secondary | ICD-10-CM | POA: Diagnosis not present

## 2023-09-14 DIAGNOSIS — M47816 Spondylosis without myelopathy or radiculopathy, lumbar region: Secondary | ICD-10-CM | POA: Diagnosis not present

## 2023-09-14 DIAGNOSIS — M0579 Rheumatoid arthritis with rheumatoid factor of multiple sites without organ or systems involvement: Secondary | ICD-10-CM | POA: Diagnosis not present

## 2023-09-14 NOTE — Telephone Encounter (Signed)
   Patient Name: Austin Cox  DOB: [birth year] MRN: [id number]  Primary Cardiologist: Lynwood Schilling, MD  Chart reviewed as part of pre-operative protocol coverage. Given past medical history and time since last visit, based on ACC/AHA guidelines, Austin Cox is at acceptable risk for the planned procedure without further cardiovascular testing.   The patient was advised that if he develops new symptoms prior to surgery to contact our office to arrange for a follow-up visit, and he verbalized understanding.  I will route this recommendation to the requesting party via Epic fax function and remove from pre-op pool.  Please call with questions.  Wyn Raddle, Jackee Shove, NP 09/14/2023, 10:08 AM

## 2023-09-14 NOTE — Telephone Encounter (Signed)
 Left message to call back. It appears patient has been diagnosed with acute URI and placed on prednisone  taper.   Next hospital availability with Dr Abran is 11/25/23.

## 2023-09-14 NOTE — Telephone Encounter (Signed)
 To follow up on previous request, is patient cleared from cardiac standpoint to move forward with colonoscopy procedure?

## 2023-09-15 NOTE — Telephone Encounter (Signed)
 Spoke to Mrs.[place] who says patient would like to cancel 09/23/23 procedure. Offered to move patient to 11/25/23 procedure at [facility] (8 weeks post URI). Mrs.Zoeller states that patient has indicated he no longer wants to have the procedures done. He is tired of having stuff done on him and they can't do anything about what they find. Mrs.Liburd is advised to call back should they change their mind regarding rescheduling.  09/23/23 procedure cancelled for [facility].

## 2023-09-15 NOTE — Telephone Encounter (Signed)
 Patient wife is returning a call back to [place]. Patient wife is requesting a call back .Please advise.

## 2023-09-22 ENCOUNTER — Ambulatory Visit (INDEPENDENT_AMBULATORY_CARE_PROVIDER_SITE_OTHER): Admitting: Nurse Practitioner

## 2023-09-22 ENCOUNTER — Ambulatory Visit (INDEPENDENT_AMBULATORY_CARE_PROVIDER_SITE_OTHER)

## 2023-09-22 ENCOUNTER — Encounter: Payer: Self-pay | Admitting: Nurse Practitioner

## 2023-09-22 ENCOUNTER — Ambulatory Visit: Payer: Self-pay

## 2023-09-22 ENCOUNTER — Telehealth: Payer: Self-pay | Admitting: Family Medicine

## 2023-09-22 VITALS — BP 113/68 | HR 77 | Temp 98.0°F | Ht 73.0 in | Wt 200.0 lb

## 2023-09-22 DIAGNOSIS — R059 Cough, unspecified: Secondary | ICD-10-CM | POA: Diagnosis not present

## 2023-09-22 DIAGNOSIS — R062 Wheezing: Secondary | ICD-10-CM | POA: Diagnosis not present

## 2023-09-22 DIAGNOSIS — J069 Acute upper respiratory infection, unspecified: Secondary | ICD-10-CM | POA: Insufficient documentation

## 2023-09-22 DIAGNOSIS — R0602 Shortness of breath: Secondary | ICD-10-CM | POA: Diagnosis not present

## 2023-09-22 MED ORDER — AZITHROMYCIN 250 MG PO TABS: ORAL_TABLET | ORAL | 0 refills | Status: DC

## 2023-09-22 MED ORDER — ALBUTEROL SULFATE HFA 108 (90 BASE) MCG/ACT IN AERS: 2.0000 | INHALATION_SPRAY | Freq: Four times a day (QID) | RESPIRATORY_TRACT | 2 refills | Status: AC | PRN

## 2023-09-22 NOTE — Telephone Encounter (Signed)
 Pt requesting prednisone  refill, was given 5 day dose recently for URI.

## 2023-09-22 NOTE — Telephone Encounter (Signed)
 E2C2 scheduled appointment.

## 2023-09-22 NOTE — Progress Notes (Addendum)
 Acute Office Visit  Subjective:     Patient ID: Austin Cox, male    DOB: 01-27-1946, 78 y.o.   MRN: [id number]  Chief Complaint  Patient presents with   chest congestion    Was seen for chest congestion was given prednisone  was getting better but came back when prednisone  stopped    Cough    HPI Austin Cox is an 78 year old male who presents with his wife on 09/22/2023 for an acute visit due to concerns of shortness of breath (SOB). He was last evaluated on 09/11/2023 by Annabella EMERSON Search, FNP, and was prescribed Prednisone  20 mg for 5 days. The patient reports that his symptoms initially improved while on the steroid; however, shortly after completing the course, he began experiencing shortness of breath again, accompanied by a productive cough with clear sputum. He denies chest pain, fever, nausea, or a history of asthma.   Active Ambulatory Problems    Diagnosis Date Noted   Nerve root pain 08/18/2013   Essential (primary) hypertension 10/11/2013   S/P coronary artery stent placement 12/21/2014   Spondylosis without myelopathy or radiculopathy, lumbar region 10/24/2018   Thrombocytopenia (HCC) 06/02/2017   Vitamin D  deficiency 02/17/2017   Rheumatoid arthritis involving multiple sites with positive rheumatoid factor (HCC) 10/24/2018   Hypertensive retinopathy of both eyes 11/15/2019   Pure hypercholesterolemia 10/17/2015   Pseudophakia of both eyes 11/15/2019   Gastroesophageal reflux disease without esophagitis 12/18/2015   Coronary artery disease involving native coronary artery of native heart without angina pectoris 12/21/2014   Chronic bilateral low back pain without sciatica 12/18/2015   Combined forms of age-related cataract of right eye 08/28/2016   Chronic myofascial pain 10/24/2018   DDD (degenerative disc disease), cervical 10/24/2018   Cervical spondylosis without myelopathy 10/24/2018   Benign prostatic hyperplasia 12/18/2015   Chronic pain syndrome  03/19/2021   Hard of hearing 03/19/2021   Carotid artery stenosis 04/09/2021   Asymptomatic carotid artery stenosis without infarction, left 04/15/2021   Cerebrovascular small vessel disease 09/18/2021   Gait instability 09/18/2021   AAA (abdominal aortic aneurysm) (HCC) 11/14/2021   Thoracic aortic aneurysm without rupture (HCC) 02/11/2022   COVID-19 03/29/2022   Failure to thrive in adult 03/29/2022   Failure to thrive (child) 03/30/2022   Generalized weakness 03/30/2022   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   Nocturia 04/24/2023   Constipation 05/19/2023   Abnormal PET scan of colon 05/19/2023   IDA (iron deficiency anemia) 05/19/2023   Essential hypertension 08/17/2023   Descending thoracic aortic aneurysm (HCC) 08/25/2023   SOB (shortness of breath) 09/22/2023   Wheezing 09/22/2023   Acute URI 09/22/2023   Resolved Ambulatory Problems    Diagnosis Date Noted   Disc disorder 08/18/2013   BP (high blood pressure) 08/25/2013   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   Lumbar degenerative disc disease 08/18/2013   Past Medical History:  Diagnosis Date   CAD    Carotid stenosis    Coronary artery disease    Hypertension    RA (rheumatoid arthritis) (HCC)    Thoracic aortic aneurysm (HCC)       Review of Systems  Constitutional:  Negative for chills and fever.  HENT:  Negative for congestion and sore throat.   Respiratory:  Positive for cough, shortness of breath and wheezing.        Clear sputum  Cardiovascular:  Negative for chest pain.  Gastrointestinal:  Negative for nausea and vomiting.  Neurological:  Negative for  dizziness and headaches.   Negative unless indicated in HPI    Objective:    BP 113/68   Pulse 77   Temp 98 F (36.7 C) (Temporal)   Ht 6' 1 (1.854 m)   Wt 200 lb (90.7 kg)   SpO2 95%   BMI 26.39 kg/m  BP Readings from Last 3 Encounters:  09/22/23 113/68  09/11/23 112/72  08/25/23 95/61   Wt Readings from Last 3  Encounters:  09/22/23 200 lb (90.7 kg)  09/11/23 201 lb 6.4 oz (91.4 kg)  08/25/23 201 lb (91.2 kg)      Physical Exam Vitals and nursing note reviewed.  Constitutional:      General: He is not in acute distress. HENT:     Head: Normocephalic and atraumatic.     Nose: Nose normal.     Mouth/Throat:     Mouth: Mucous membranes are moist.  Eyes:     Extraocular Movements: Extraocular movements intact.     Conjunctiva/sclera: Conjunctivae normal.     Pupils: Pupils are equal, round, and reactive to light.  Pulmonary:     Breath sounds: Examination of the right-lower field reveals wheezing. Examination of the left-lower field reveals wheezing. Wheezing present.  Musculoskeletal:        General: Normal range of motion.     Right lower leg: No edema.     Left lower leg: No edema.  Skin:    General: Skin is warm and dry.     Findings: No rash.  Neurological:     Mental Status: He is oriented to person, place, and time.     Comments: Used a stick for ambulation  Psychiatric:        Mood and Affect: Mood normal.        Behavior: Behavior normal.        Thought Content: Thought content normal.        Judgment: Judgment normal.    A Chest X-Ray was ordered. My reading of this film is preliminary. (No comparison films available: pending review by Radiologist.)  No results found for any visits on 09/22/23.      Assessment & Plan:  Acute URI -     DG Chest 2 View; Future  Wheezing -     DG Chest 2 View; Future -     Albuterol  Sulfate HFA; Inhale 2 puffs into the lungs every 6 (six) hours as needed for wheezing or shortness of breath.  Dispense: 8 g; Refill: 2  SOB (shortness of breath) -     DG Chest 2 View; Future -     Albuterol  Sulfate HFA; Inhale 2 puffs into the lungs every 6 (six) hours as needed for wheezing or shortness of breath.  Dispense: 8 g; Refill: 2  Other orders -     Azithromycin ; Take two tabs on the first day and 1-tab daily until done  Dispense: 6 each;  Refill: 13  Gmail 78 year old Caucasian male seen today for URI symptoms, no acute distress X-ray ordered awaiting final report from radiology Ventolin  inhaler, Z-Pak No. 6 dispense Is instructed to take all the antibiotic until the Increase hydration Call or return to clinic prn if these symptoms worsen or fail to improve as anticipated.  Return if symptoms worsen or fail to improve.  Austin Meriweather [place] Thompson, DNP [facility] [address] [place], [place] [address] [phone]  Note: This document was prepared by [employer] voice dictation technology and any errors that  results from this process are unintentional.

## 2023-09-22 NOTE — Telephone Encounter (Unsigned)
 Copied from CRM [id number]. Topic: Clinical - Medication Refill >> Sep 22, 2023  9:24 AM Merlynn A wrote: Medication: Prednisone   Has the patient contacted their pharmacy? No (Agent: If no, request that the patient contact the pharmacy for the refill. If patient does not wish to contact the pharmacy document the reason why and proceed with request.) (Agent: If yes, when and what did the pharmacy advise?)  Patient had 5 day supply of Prednisone  and is all out now and congestion came back. Patient is asking if a refill can be sent to pharmacy for him as the medication was working but now that he is out he is not feeling well.  This is the patient's preferred pharmacy:  [id number] - [place], [place] - [address] [place] [address] Phone: [phone] Fax: [phone]  Is this the correct pharmacy for this prescription? Yes If no, delete pharmacy and type the correct one.   Has the prescription been filled recently? No  Is the patient out of the medication? Yes  Has the patient been seen for an appointment in the last year OR does the patient have an upcoming appointment? Yes  Can we respond through MyChart? No  Agent: Please be advised that Rx refills may take up to 3 business days. We ask that you follow-up with your pharmacy.

## 2023-09-22 NOTE — Telephone Encounter (Signed)
 FYI Only or Action Required?: FYI only for provider.  Patient was last seen in primary care on 09/11/2023 by Austin Annabella HERO, FNP.  Called Nurse Triage reporting Cough.  Symptoms began several days ago.  Interventions attempted: OTC medications: tylenol  and mucinex .  Symptoms are: unchanged.  Triage Disposition: See Physician Within 24 Hours  Patient/caregiver understands and will follow disposition?: Yes                      Reason for Disposition  [1] Continuous (nonstop) coughing interferes with work or school AND [2] no improvement using cough treatment per Care Advice  Answer Assessment - Initial Assessment Questions 1. ONSET: When did the cough begin?      2 days ago 2. SEVERITY: How bad is the cough today?      Mild-mod 3. SPUTUM: Describe the color of your sputum (none, dry cough; clear, white, yellow, green)     no 4. HEMOPTYSIS: Are you coughing up any blood? If so ask: How much? (flecks, streaks, tablespoons, etc.)     no 5. DIFFICULTY BREATHING: Are you having difficulty breathing? If Yes, ask: How bad is it? (e.g., mild, moderate, severe)    - MILD: No SOB at rest, mild SOB with walking, speaks normally in sentences, can lie down, no retractions, pulse < 100.    - MODERATE: SOB at rest, SOB with minimal exertion and prefers to sit, cannot lie down flat, speaks in phrases, mild retractions, audible wheezing, pulse 100-120.    - SEVERE: Very SOB at rest, speaks in single words, struggling to breathe, sitting hunched forward, retractions, pulse > 120      no 6. FEVER: Do you have a fever? If Yes, ask: What is your temperature, how was it measured, and when did it start?     no 7. CARDIAC HISTORY: Do you have any history of heart disease? (e.g., heart attack, congestive heart failure)      no  10. OTHER SYMPTOMS: Do you have any other symptoms? (e.g., runny nose, wheezing, chest pain)       Chest congestion  Protocols used:  Cough - Acute Productive-A-AH

## 2023-09-23 ENCOUNTER — Encounter (HOSPITAL_COMMUNITY): Payer: Self-pay

## 2023-09-23 ENCOUNTER — Ambulatory Visit (HOSPITAL_COMMUNITY): Admit: 2023-09-23 | Admitting: Internal Medicine

## 2023-09-23 SURGERY — COLONOSCOPY
Anesthesia: Monitor Anesthesia Care

## 2023-09-24 ENCOUNTER — Ambulatory Visit: Payer: Self-pay | Admitting: Nurse Practitioner

## 2023-09-24 ENCOUNTER — Telehealth: Payer: Self-pay

## 2023-09-24 NOTE — Telephone Encounter (Signed)
 [place] spoke to pt's wife and documented in a separate encounter. Ls

## 2023-09-24 NOTE — Telephone Encounter (Signed)
 Copied from CRM [id number]. Topic: Clinical - Lab/Test Results >> Sep 24, 2023  2:41 PM Ivette P wrote: Reason for CRM: Pt wife Clarita called in to see about most recent imaging results.   Called [facility] and nurses were not available.    Pls reach out to pt with results.

## 2023-10-02 ENCOUNTER — Ambulatory Visit: Payer: Self-pay | Admitting: *Deleted

## 2023-10-02 NOTE — Telephone Encounter (Signed)
 Copied from CRM [id number]. Topic: Clinical - Red Word Triage >> Oct 02, 2023 11:35 AM Elle L wrote: Red Word that prompted transfer to Nurse Triage: The patient's wife states the patient has finished his prescriptions from his appointment on 7/8. However, he is still having chest congestion with a worsening cough and when it is productive there is discolored mucous. Reason for Disposition  [1] Continuous (nonstop) coughing interferes with work or school AND [2] no improvement using cough treatment per Care Advice  Answer Assessment - Initial Assessment Questions 1. ONSET: When did the cough begin?      Clarita, wife calling in. He has congestion in his chest.   The prednisone  was taken.   He still has congestion in his chest.   He is not able to cough it up.   It's making his chest hurt.   At one point he was coughing up mucus.   2. SEVERITY: How bad is the cough today?      His neck is hurting.   When coughing his neck is hurting.   It's better now.   The cough has not changed.   He just can't get anything up.    He took Mucinex  once in a while.   He was just on the prednisone .   I think he needs an antibiotic.    3. SPUTUM: Describe the color of your sputum (e.g., none, dry cough; clear, white, yellow, green)     Not getting anything up. 4. HEMOPTYSIS: Are you coughing up any blood? If Yes, ask: How much? (e.g., flecks, streaks, tablespoons, etc.)     Not asked 5. DIFFICULTY BREATHING: Are you having difficulty breathing? If Yes, ask: How bad is it? (e.g., mild, moderate, severe)      No shortness of breath 6. FEVER: Do you have a fever? If Yes, ask: What is your temperature, how was it measured, and when did it start?     No 7. CARDIAC HISTORY: Do you have any history of heart disease? (e.g., heart attack, congestive heart failure)      Not asked 8. LUNG HISTORY: Do you have any history of lung disease?  (e.g., pulmonary embolus, asthma, emphysema)     Not asked He  gets this URI every year.  9. PE RISK FACTORS: Do you have a history of blood clots? (or: recent major surgery, recent prolonged travel, bedridden)     Not asked 10. OTHER SYMPTOMS: Do you have any other symptoms? (e.g., runny nose, wheezing, chest pain)       No 11. PREGNANCY: Is there any chance you are pregnant? When was your last menstrual period?       N/A 12. TRAVEL: Have you traveled out of the country in the last month? (e.g., travel history, exposures)       N/A  Protocols used: Cough - Acute Productive-A-AH FYI Only or Action Required?: Action required by provider: clinical question for provider and update on patient condition.  Patient was last seen in primary care on 09/22/2023 by Deitra Morton Sebastian Nena, NP.  Called Nurse Triage reporting Cough.  Symptoms began several weeks ago.Seen 09/22/2023.  Continues to have a cough but it's non productive now.   He can't seem to get anything up.   No other symptoms.   Wife requesting an antibiotic.   He gets this every year and an antibiotic usually clears him up.  Interventions attempted: Prescription medications: prednisone .  Symptoms are: unchanged Was coughing up mucus but now  can't get anything up.  Triage Disposition: Call PCP Now  Patient/caregiver understands and will follow disposition?:  Yes

## 2023-10-02 NOTE — Telephone Encounter (Signed)
Called and lmom to schedule appt

## 2023-10-06 ENCOUNTER — Ambulatory Visit: Payer: Self-pay

## 2023-10-06 NOTE — Telephone Encounter (Signed)
 Called [facility] to assist in scheduling patient.   FYI Only or Action Required?: FYI only for provider.  Patient was last seen in primary care on 09/22/2023 by Deitra Morton Sebastian Nena, NP.  Called Nurse Triage reporting Shortness of Breath and Cough.  Symptoms began prior to visit on 7/8.  Interventions attempted: OTC medications: Mucinex , Prescription medications: Albuterol , and Rest, hydration, or home remedies.  Symptoms are: gradually worsening.  Triage Disposition: See HCP Within 4 Hours (Or PCP Triage)  Patient/caregiver understands and will follow disposition?: Unsure                          Copied from CRM [id number]. Topic: Clinical - Red Word Triage >> Oct 06, 2023  8:04 AM Avram MATSU wrote: Red Word that prompted transfer to Nurse Triage: congested and wheezing Reason for Disposition  [1] MILD difficulty breathing (e.g., minimal/no SOB at rest, SOB with walking, pulse < 100) AND [2] NEW-onset or WORSE than normal  Answer Assessment - Initial Assessment Questions Only having pain when he coughs Unable to cough up congestion Patient still not better from his recent visit in the office on 7/8 Taking Mucinex  Taking Albuterol  Patient's wife Clarita is advised that if anything changes or worsens to take the patient to Urgent Care or to the Emergency Room Wife verbalized understanding.    1. RESPIRATORY STATUS: Describe your breathing? (e.g., wheezing, shortness of breath, unable to speak, severe coughing)      Shortness of breath  2. ONSET: When did this breathing problem begin?      7/18 got worse 3. PATTERN Does the difficult breathing come and go, or has it been constant since it started?      Off and on 4. SEVERITY: How bad is your breathing? (e.g., mild, moderate, severe)      At rest and on exertion 5. RECURRENT SYMPTOM: Have you had difficulty breathing before? If Yes, ask: When was the last time? and What happened that time?       Every year but usually clears up with antibiotic 6. CARDIAC HISTORY: Do you have any history of heart disease? (e.g., heart attack, angina, bypass surgery, angioplasty)      Significant 7. LUNG HISTORY: Do you have any history of lung disease?  (e.g., pulmonary embolus, asthma, emphysema)     No 8. CAUSE: What do you think is causing the breathing problem?      ------ 9. OTHER SYMPTOMS: Do you have any other symptoms? (e.g., chest pain, cough, dizziness, fever, runny nose)     Cough--non productive, congestion in chest 10. O2 SATURATION MONITOR:  Do you use an oxygen saturation monitor (pulse oximeter) at home? If Yes, ask: What is your reading (oxygen level) today? What is your usual oxygen saturation reading? (e.g., 95%)       -----  Protocols used: Breathing Difficulty-A-AH

## 2023-10-06 NOTE — Telephone Encounter (Signed)
 Appointment scheduled.

## 2023-10-07 ENCOUNTER — Encounter: Payer: Self-pay | Admitting: Nurse Practitioner

## 2023-10-07 ENCOUNTER — Ambulatory Visit (INDEPENDENT_AMBULATORY_CARE_PROVIDER_SITE_OTHER): Admitting: Nurse Practitioner

## 2023-10-07 VITALS — BP 94/61 | HR 74 | Temp 98.3°F | Ht 73.0 in | Wt 200.0 lb

## 2023-10-07 DIAGNOSIS — R051 Acute cough: Secondary | ICD-10-CM

## 2023-10-07 MED ORDER — GUAIFENESIN 400 MG PO TABS: 400.0000 mg | ORAL_TABLET | Freq: Four times a day (QID) | ORAL | 0 refills | Status: AC | PRN

## 2023-10-07 NOTE — Progress Notes (Signed)
 Acute Office Visit  Subjective:     Patient ID: Austin Cox, male    DOB: Sep 25, 1945, 78 y.o.   MRN: [id number]  Chief Complaint  Patient presents with   Cough    Symptoms for couple weeks. When breathing deep hurts in chest, unable to cough anything up   Shortness of Breath    HPI Austin Cox is a 79 y.o. male presents with his wife on  10/07/2023 for an acute visit was seen twice by myself 09/22/2023, had x-ray that negative for pneumonia or bronchitis, also on 6/27/205 by Joesph and was prescribed prednisone .  He has completed a course of steroids and antibiotic.  Wife states that he is still dealing with a dry cough and occasional shortness of breath.  When asked about how he been using the inhaler I just put it in my inbox and dispense it he has not been checking inhaler to activate the med.  He is drinking plenty of fluid denies any chest pain, fever or chills   Active Ambulatory Problems    Diagnosis Date Noted   Nerve root pain 08/18/2013   Essential (primary) hypertension 10/11/2013   S/P coronary artery stent placement 12/21/2014   Spondylosis without myelopathy or radiculopathy, lumbar region 10/24/2018   Thrombocytopenia (HCC) 06/02/2017   Vitamin D  deficiency 02/17/2017   Rheumatoid arthritis involving multiple sites with positive rheumatoid factor (HCC) 10/24/2018   Hypertensive retinopathy of both eyes 11/15/2019   Pure hypercholesterolemia 10/17/2015   Pseudophakia of both eyes 11/15/2019   Gastroesophageal reflux disease without esophagitis 12/18/2015   Coronary artery disease involving native coronary artery of native heart without angina pectoris 12/21/2014   Chronic bilateral low back pain without sciatica 12/18/2015   Combined forms of age-related cataract of right eye 08/28/2016   Chronic myofascial pain 10/24/2018   DDD (degenerative disc disease), cervical 10/24/2018   Cervical spondylosis without myelopathy 10/24/2018   Benign prostatic hyperplasia  12/18/2015   Chronic pain syndrome 03/19/2021   Hard of hearing 03/19/2021   Carotid artery stenosis 04/09/2021   Asymptomatic carotid artery stenosis without infarction, left 04/15/2021   Cerebrovascular small vessel disease 09/18/2021   Gait instability 09/18/2021   AAA (abdominal aortic aneurysm) (HCC) 11/14/2021   Thoracic aortic aneurysm without rupture (HCC) 02/11/2022   COVID-19 03/29/2022   Failure to thrive in adult 03/29/2022   Failure to thrive (child) 03/30/2022   Generalized weakness 03/30/2022   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   Nocturia 04/24/2023   Constipation 05/19/2023   Abnormal PET scan of colon 05/19/2023   IDA (iron deficiency anemia) 05/19/2023   Essential hypertension 08/17/2023   Descending thoracic aortic aneurysm (HCC) 08/25/2023   SOB (shortness of breath) 09/22/2023   Wheezing 09/22/2023   Acute URI 09/22/2023   Resolved Ambulatory Problems    Diagnosis Date Noted   Disc disorder 08/18/2013   BP (high blood pressure) 08/25/2013   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   Lumbar degenerative disc disease 08/18/2013   Past Medical History:  Diagnosis Date   CAD    Carotid stenosis    Coronary artery disease    Hypertension    RA (rheumatoid arthritis) (HCC)    Thoracic aortic aneurysm (HCC)       Review of Systems  Constitutional:  Negative for chills and fever.  HENT:  Negative for sinus pain and sore throat.   Respiratory:  Positive for cough and shortness of breath.        Dry  nonproductive; occasional shortness of breath  Cardiovascular:  Negative for chest pain and leg swelling.  Gastrointestinal:  Negative for blood in stool, nausea and vomiting.  Musculoskeletal:  Negative for falls.  Skin:  Negative for itching and rash.  Neurological:  Negative for dizziness and headaches.   Negative unless indicated in HPI    Objective:    BP 94/61   Pulse 74   Temp 98.3 F (36.8 C) (Temporal)   Ht 6' 1 (1.854 m)    Wt 200 lb (90.7 kg)   SpO2 95%   BMI 26.39 kg/m  BP Readings from Last 3 Encounters:  10/07/23 94/61  09/22/23 113/68  09/11/23 112/72   Wt Readings from Last 3 Encounters:  10/07/23 200 lb (90.7 kg)  09/22/23 200 lb (90.7 kg)  09/11/23 201 lb 6.4 oz (91.4 kg)      Physical Exam Vitals and nursing note reviewed.  Constitutional:      General: He is not in acute distress. HENT:     Head: Normocephalic and atraumatic.     Right Ear: Tympanic membrane and external ear normal. There is no impacted cerumen.     Left Ear: Tympanic membrane, ear canal and external ear normal. There is no impacted cerumen.     Nose: Nose normal. No congestion or rhinorrhea.     Mouth/Throat:     Mouth: Mucous membranes are moist.  Eyes:     General: No scleral icterus.    Extraocular Movements: Extraocular movements intact.     Conjunctiva/sclera: Conjunctivae normal.     Pupils: Pupils are equal, round, and reactive to light.  Cardiovascular:     Heart sounds: Normal heart sounds.  Pulmonary:     Effort: Pulmonary effort is normal.     Breath sounds: Normal breath sounds.  Musculoskeletal:        General: Normal range of motion.     Right lower leg: No edema.     Left lower leg: No edema.  Skin:    General: Skin is warm and dry.     Findings: Rash present.     Comments: Right groin and has been applying prescribed clotrimazole   Neurological:     Mental Status: He is oriented to person, place, and time.  Psychiatric:        Mood and Affect: Mood normal.        Behavior: Behavior normal.        Thought Content: Thought content normal.        Judgment: Judgment normal.     No results found for any visits on 10/07/23.      Assessment & Plan:  Acute cough -     guaiFENesin ; Take 1 tablet (400 mg total) by mouth every 6 (six) hours as needed.  Dispense: 30 tablet; Refill: 0  Austin Cox is a 77 year old Caucasian male seen today for cough, no acute distress Cough: Guaifenesin  400 mg 3  times daily as needed, increase hydration Education provided on the proper use of the inhaler Increase hydration; rest, and return office visit prn if symptoms persist or worsen. Lack of antibiotic effectiveness discussed with him. Call or return to clinic prn if these symptoms worsen or fail to improve as anticipated.  Return if symptoms worsen or fail to improve.  Austin Hagwood [place] Thompson, DNP [facility] [address] [place], [place] [address] [phone]  Note: This document was prepared by [employer] voice dictation technology and any errors that results from this  process are unintentional.

## 2023-10-15 DIAGNOSIS — R2681 Unsteadiness on feet: Secondary | ICD-10-CM | POA: Diagnosis not present

## 2023-10-15 DIAGNOSIS — M0579 Rheumatoid arthritis with rheumatoid factor of multiple sites without organ or systems involvement: Secondary | ICD-10-CM | POA: Diagnosis not present

## 2023-10-15 DIAGNOSIS — M47816 Spondylosis without myelopathy or radiculopathy, lumbar region: Secondary | ICD-10-CM | POA: Diagnosis not present

## 2023-10-27 ENCOUNTER — Ambulatory Visit: Payer: No Typology Code available for payment source | Admitting: Family Medicine

## 2023-10-27 ENCOUNTER — Encounter: Payer: Self-pay | Admitting: Family Medicine

## 2023-10-27 VITALS — BP 101/58 | HR 68 | Temp 97.9°F

## 2023-10-27 DIAGNOSIS — M25562 Pain in left knee: Secondary | ICD-10-CM | POA: Diagnosis not present

## 2023-10-27 DIAGNOSIS — N401 Enlarged prostate with lower urinary tract symptoms: Secondary | ICD-10-CM | POA: Diagnosis not present

## 2023-10-27 DIAGNOSIS — I10 Essential (primary) hypertension: Secondary | ICD-10-CM | POA: Diagnosis not present

## 2023-10-27 DIAGNOSIS — E559 Vitamin D deficiency, unspecified: Secondary | ICD-10-CM

## 2023-10-27 DIAGNOSIS — M0579 Rheumatoid arthritis with rheumatoid factor of multiple sites without organ or systems involvement: Secondary | ICD-10-CM | POA: Diagnosis not present

## 2023-10-27 DIAGNOSIS — R351 Nocturia: Secondary | ICD-10-CM

## 2023-10-27 DIAGNOSIS — G8929 Other chronic pain: Secondary | ICD-10-CM | POA: Diagnosis not present

## 2023-10-27 DIAGNOSIS — E7800 Pure hypercholesterolemia, unspecified: Secondary | ICD-10-CM

## 2023-10-27 DIAGNOSIS — B372 Candidiasis of skin and nail: Secondary | ICD-10-CM | POA: Diagnosis not present

## 2023-10-27 DIAGNOSIS — D696 Thrombocytopenia, unspecified: Secondary | ICD-10-CM | POA: Diagnosis not present

## 2023-10-27 DIAGNOSIS — I7143 Infrarenal abdominal aortic aneurysm, without rupture: Secondary | ICD-10-CM | POA: Diagnosis not present

## 2023-10-27 MED ORDER — NYSTATIN 100000 UNIT/GM EX POWD: 1.0000 | Freq: Three times a day (TID) | CUTANEOUS | 0 refills | Status: AC

## 2023-10-27 MED ORDER — TAMSULOSIN HCL 0.4 MG PO CAPS: 0.8000 mg | ORAL_CAPSULE | Freq: Every day | ORAL | 3 refills | Status: AC

## 2023-10-27 NOTE — Progress Notes (Signed)
 Subjective:  Patient ID: Austin Cox, male    DOB: [birth year], 78 y.o.   MRN: [id number]  Patient Care Team: Severa Rock HERO, FNP as PCP - General (Family Medicine) Lavona Agent, MD as PCP - Cardiology (Cardiology) Rogers Hai, MD as Medical Oncologist (Hematology)   Chief Complaint:  Coronary Artery Disease and Medical Management of Chronic Issues   HPI: Austin Cox is a 78 y.o. male presenting on 10/27/2023 for Coronary Artery Disease and Medical Management of Chronic Issues  Austin Cox is an 78 year old male who presents with chronic left knee pain.  He has chronic pain in his left knee, which persists despite previous treatments. Two injections provided relief for about a week, but the pain returned. No oral medications have been prescribed for the knee pain, and using a knee brace exacerbates the pain. Oral prednisone  helped reduce swelling, and he uses ice and heat, although these do not seem to help. He also uses [facility] for relief.  He is currently on Enbrel injections and takes gabapentin  for pain management. He has an upcoming appointment with a rheumatologist this month but has not seen the rheumatologist since February or December. He continues to take methotrexate .  He experiences swelling in the leg. Sometimes the knee swells up to the point where he cannot use it and has to sit in a chair.  He has a history of a yeast infection in the groin area, initially treated with a cream. The rash improved but did not completely resolve. He has not used nystatin  powder to keep the area dry.  No chest pain, headaches, or dizziness. He reports nocturia, getting up multiple times at night to urinate, and is currently taking Flomax , one in the morning and one at night, to manage this symptom.  He is taking vitamin D  and folic acid  supplements. He reports regular bowel movements and takes a stool softener.         Relevant past medical, surgical, family, and  social history reviewed and updated as indicated.  Allergies and medications reviewed and updated. Data reviewed: Chart in Epic.   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    Past Surgical History:  Procedure Laterality Date   CATARACT EXTRACTION     ENDARTERECTOMY Left 04/15/2021   Procedure: LEFT CAROTID ENDARTERECTOMY WITH;  Surgeon: Gretta Lonni PARAS, MD;  Location: [facility] OR;  Service: Vascular;  Laterality: Left;   EYE SURGERY     detached retina right eye   FRACTURE SURGERY     right leg   Stent in heart      Social History   Socioeconomic History   Marital status: Married    Spouse name: Not on file   Number of children: 3   Years of education: Not on file   Highest education level: Not on file  Occupational History   Occupation: retired  Tobacco Use   Smoking status: Former    Current packs/day: 0.00    Average packs/day: 0.2 packs/day for 10.0 years (2.0 ttl pk-yrs)    Types: Cigarettes    Start date: 07/05/1964    Quit date: 07/06/1974    Years since  quitting: 49.3   Smokeless tobacco: Never  Vaping Use   Vaping status: Never Used  Substance and Sexual Activity   Alcohol  use: Not Currently    Alcohol /week: 1.0 standard drink of alcohol     Types: 1 Cans of beer per week   Drug use: Never   Sexual activity: Not on file  Other Topics Concern   Not on file  Social History Narrative   3 children - one passed away   Son in [place]   They raised 2 teenage grandchildren after their daughter and son in law both passed away    Social Drivers of Health   Financial Resource Strain: Low Risk  (07/01/2023)   Overall Financial Resource Strain (CARDIA)    Difficulty of Paying Living Expenses: Not hard at all  Food  Insecurity: No Food Insecurity (07/01/2023)   Hunger Vital Sign    Worried About Running Out of Food in the Last Year: Never true    Ran Out of Food in the Last Year: Never true  Transportation Needs: No Transportation Needs (07/01/2023)   PRAPARE - Administrator, Civil Service (Medical): No    Lack of Transportation (Non-Medical): No  Physical Activity: Insufficiently Active (07/01/2023)   Exercise Vital Sign    Days of Exercise per Week: 3 days    Minutes of Exercise per Session: 30 min  Stress: No Stress Concern Present (07/01/2023)   [employer] of Occupational Health - Occupational Stress Questionnaire    Feeling of Stress : Not at all  Social Connections: Moderately Isolated (07/01/2023)   Social Connection and Isolation Panel    Frequency of Communication with Friends and Family: More than three times a week    Frequency of Social Gatherings with Friends and Family: Three times a week    Attends Religious Services: Never    Active Member of Clubs or Organizations: No    Attends Banker Meetings: Never    Marital Status: Married  Catering manager Violence: Not At Risk (07/01/2023)   Humiliation, Afraid, Rape, and Kick questionnaire    Fear of Current or Ex-Partner: No    Emotionally Abused: No    Physically Abused: No    Sexually Abused: No    Outpatient Encounter Medications as of 10/27/2023  Medication Sig   acetaminophen  (TYLENOL ) 650 MG CR tablet Take 650 mg by mouth every 8 (eight) hours as needed for pain.   albuterol  (VENTOLIN  HFA) 108 (90 Base) MCG/ACT inhaler Inhale 2 puffs into the lungs every 6 (six) hours as needed for wheezing or shortness of breath.   aspirin  81 MG EC tablet Take 81 mg by mouth daily.   baclofen  (LIORESAL ) 10 MG tablet Take 10 mg by mouth 2 (two) times daily.   Cholecalciferol  25 MCG (1000 UT) capsule Take 1 capsule (1,000 Units total) by mouth daily.   clotrimazole  (LOTRIMIN ) 1 % cream APPLY TO AFFECTED AREA TWICE A  DAY   diclofenac Sodium (VOLTAREN) 1 % GEL PLACE ONTO THE SKIN 4 (FOUR) TIMES A DAY AS NEEDED FOR UP TO 30 DAYS.   etanercept (ENBREL SURECLICK) 50 MG/ML injection Inject 50 mg into the skin every Friday.   ferrous sulfate  325 (65 FE) MG EC tablet Take 1 tablet (325 mg total) by mouth daily with breakfast.   folic acid  (FOLVITE ) 1 MG tablet Take 1 mg by mouth daily.   gabapentin  (NEURONTIN ) 300 MG capsule Take 300 mg by mouth 2 (two) times daily.   guaifenesin  (HUMIBID  E) 400 MG TABS tablet Take 1 tablet (400 mg total) by mouth every 6 (six) hours as needed.   methotrexate  (RHEUMATREX) 2.5 MG tablet Take 4 mg by mouth every Tuesday.   metoprolol  succinate (TOPROL -XL) 25 MG 24 hr tablet Take 1 tablet (25 mg total) by mouth daily.   nitroGLYCERIN  (NITROSTAT ) 0.3 MG SL tablet Place 1 tablet (0.3 mg total) under the tongue every 5 (five) minutes as needed.   nystatin  (MYCOSTATIN /NYSTOP ) powder Apply 1 Application topically 3 (three) times daily.   rosuvastatin  (CRESTOR ) 40 MG tablet Take 1 tablet (40 mg total) by mouth daily.   Saw Palmetto 450-15 MG CAPS Take 2 tablets by mouth daily.   Tetrahydrozoline HCl (VISINE OP) Place 1 drop into both eyes daily as needed (dry eyes).   [DISCONTINUED] tamsulosin  (FLOMAX ) 0.4 MG CAPS capsule Take 1 capsule (0.4 mg total) by mouth at bedtime.   tamsulosin  (FLOMAX ) 0.4 MG CAPS capsule Take 2 capsules (0.8 mg total) by mouth at bedtime.   [DISCONTINUED] azithromycin  (ZITHROMAX ) 250 MG tablet Take two tabs on the first day and 1-tab daily until done (Patient not taking: Reported on 10/27/2023)   No facility-administered encounter medications on file as of 10/27/2023.    No Known Allergies  Pertinent ROS per HPI, otherwise unremarkable      Objective:  BP (!) 101/58   Pulse 68   Temp 97.9 F (36.6 C)    Wt Readings from Last 3 Encounters:  10/07/23 200 lb (90.7 kg)  09/22/23 200 lb (90.7 kg)  09/11/23 201 lb 6.4 oz (91.4 kg)    Physical Exam Vitals  and nursing note reviewed.  Constitutional:      General: He is not in acute distress.    Appearance: Normal appearance. He is not ill-appearing, toxic-appearing or diaphoretic.  HENT:     Head: Normocephalic and atraumatic.     Nose: Nose normal.     Mouth/Throat:     Mouth: Mucous membranes are moist.  Eyes:     Conjunctiva/sclera: Conjunctivae normal.     Comments: Pupils unequal, normal for pt  Neck:     Vascular: Carotid bruit present.  Cardiovascular:     Rate and Rhythm: Normal rate and regular rhythm.     Comments: Bounding apical pulse Pulmonary:     Effort: Pulmonary effort is normal.     Breath sounds: Normal breath sounds.  Musculoskeletal:     Cervical back: Neck supple.     Left upper leg: Normal.     Left knee: No swelling, deformity, effusion, erythema, ecchymosis, lacerations or bony tenderness. Decreased range of motion. Tenderness present over the medial joint line.     Right lower leg: Normal. No edema.     Left lower leg: Normal. No edema.  Skin:    General: Skin is warm and dry.     Capillary Refill: Capillary refill takes less than 2 seconds.     Findings: Rash (beefy red, groin) present.  Neurological:     General: No focal deficit present.     Mental Status: He is alert and oriented to person, place, and time.     Gait: Gait abnormal (using rollator).  Psychiatric:        Mood and Affect: Mood normal.        Behavior: Behavior normal.        Thought Content: Thought content normal.        Judgment: Judgment normal.     MUSCULOSKELETAL: Tenderness in the medial  side of the knee.        Results for orders placed or performed in visit on 06/25/23  CBC with Differential/Platelet   Collection Time: 06/25/23  2:29 PM  Result Value Ref Range   WBC 4.6 4.0 - 10.5 K/uL   RBC 5.08 4.22 - 5.81 MIL/uL   Hemoglobin 13.4 13.0 - 17.0 g/dL   HCT 57.3 60.9 - 47.9 %   MCV 83.9 80.0 - 100.0 fL   MCH 26.4 26.0 - 34.0 pg   MCHC 31.5 30.0 - 36.0 g/dL   RDW  80.5 (H) 88.4 - 15.5 %   Platelets 52 (L) 150 - 400 K/uL   nRBC 0.0 0.0 - 0.2 %   Neutrophils Relative % 48 %   Neutro Abs 2.2 1.7 - 7.7 K/uL   Lymphocytes Relative 22 %   Lymphs Abs 1.0 0.7 - 4.0 K/uL   Monocytes Relative 28 %   Monocytes Absolute 1.3 (H) 0.1 - 1.0 K/uL   Eosinophils Relative 0 %   Eosinophils Absolute 0.0 0.0 - 0.5 K/uL   Basophils Relative 0 %   Basophils Absolute 0.0 0.0 - 0.1 K/uL   RBC Morphology MORPHOLOGY UNREMARKABLE    Smear Review PLATELETS APPEAR DECREASED    Metamyelocytes Relative 2 %   Abs Immature Granulocytes 0.10 (H) 0.00 - 0.07 K/uL   Reactive, Benign Lymphocytes PRESENT   Iron and TIBC   Collection Time: 06/25/23  2:29 PM  Result Value Ref Range   Iron 32 (L) 45 - 182 ug/dL   TIBC 704 749 - 549 ug/dL   Saturation Ratios 11 (L) 17.9 - 39.5 %   UIBC 263 ug/dL  Ferritin   Collection Time: 06/25/23  2:29 PM  Result Value Ref Range   Ferritin 59 24 - 336 ng/mL  Immature Platelet Fraction   Collection Time: 06/25/23  2:29 PM  Result Value Ref Range   Immature Platelet Fraction 6.3 1.2 - 8.6 %       Pertinent labs & imaging results that were available during my care of the patient were reviewed by me and considered in my medical decision making.  Assessment & Plan:  Kerin was seen today for coronary artery disease and medical management of chronic issues.  Diagnoses and all orders for this visit:  Essential (primary) hypertension -     Anemia Profile B -     CMP14+EGFR -     Lipid panel -     Thyroid  Panel With TSH  Pure hypercholesterolemia -     CMP14+EGFR -     Lipid panel  Benign prostatic hyperplasia with nocturia -     PSA, total and free -     tamsulosin  (FLOMAX ) 0.4 MG CAPS capsule; Take 2 capsules (0.8 mg total) by mouth at bedtime.  Vitamin D  deficiency -     CMP14+EGFR -     VITAMIN D  25 Hydroxy (Vit-D Deficiency, Fractures)  Rheumatoid arthritis involving multiple sites with positive rheumatoid factor (HCC) -      Anemia Profile B  Chronic pain of left knee -     VITAMIN D  25 Hydroxy (Vit-D Deficiency, Fractures)  Thrombocytopenia (HCC) -     Anemia Profile B  Infrarenal abdominal aortic aneurysm (AAA) without rupture (HCC) -     Anemia Profile B -     CMP14+EGFR -     Lipid panel  Yeast dermatitis -     nystatin  (MYCOSTATIN /NYSTOP ) powder; Apply 1 Application topically 3 (three) times daily.  Nocturia -     tamsulosin  (FLOMAX ) 0.4 MG CAPS capsule; Take 2 capsules (0.8 mg total) by mouth at bedtime.      Chronic left knee pain and swelling in the setting of rheumatoid arthritis Chronic left knee pain and swelling exacerbated by rheumatoid arthritis. Previous knee injections provided temporary relief. Oral prednisone  has been effective in reducing swelling. Current management includes gabapentin  and methotrexate . Gabapentin  may contribute to leg swelling. Awaiting rheumatology appointment this month. - Order blood work to assess platelet count - Consider prescribing oral prednisone  if platelet count is adequate - Recommend trial of Biofreeze for topical pain relief - Continue current medications including gabapentin  and methotrexate   Thrombocytopenia, under hematology/oncology care Thrombocytopenia under the care of hematology/oncology. Recent CT scan and blood work have not led to changes in management. Follow-up with hematology/oncology is scheduled. - Perform blood work today to update labs  Abdominal aortic aneurysm, 7.4 cm, medically managed Abdominal aortic aneurysm measuring 7.4 cm, slightly increased in size. Managed medically with blood pressure control. Surgical intervention not currently planned.  Benign prostatic hyperplasia with nocturia Benign prostatic hyperplasia causing nocturia. Current management includes saw palmetto and Flomax . Nocturia persists, raising concern for fall risk during nighttime awakenings. - Instruct to take both doses of Flomax  at night to reduce  nocturia - Monitor for improvement and report if nocturia persists  Recurrent intertriginous groin candidiasis Recurrent yeast infection in the groin area, previously treated with cream. Persistent symptoms suggest need for alternative treatment to maintain dryness and antifungal action. - Prescribe nystatin  powder to be applied three times daily - Instruct to use nystatin  powder until rash resolves and continue for two additional days          Continue all other maintenance medications.  Follow up plan: Return in about 6 months (around 04/28/2024) for chronic follow up.   Continue healthy lifestyle choices, including diet (rich in fruits, vegetables, and lean proteins, and low in salt and simple carbohydrates) and exercise (at least 30 minutes of moderate physical activity daily).  Educational handout given for genital yeast infection  The above assessment and management plan was discussed with the patient. The patient verbalized understanding of and has agreed to the management plan. Patient is aware to call the clinic if they develop any new symptoms or if symptoms persist or worsen. Patient is aware when to return to the clinic for a follow-up visit. Patient educated on when it is appropriate to go to the emergency department.   Rosaline Bruns, FNP-C [facility] [phone]

## 2023-10-28 ENCOUNTER — Telehealth: Payer: Self-pay

## 2023-10-28 ENCOUNTER — Ambulatory Visit: Payer: Self-pay | Admitting: Family Medicine

## 2023-10-28 DIAGNOSIS — M0579 Rheumatoid arthritis with rheumatoid factor of multiple sites without organ or systems involvement: Secondary | ICD-10-CM

## 2023-10-28 LAB — ANEMIA PROFILE B
Basophils Absolute: 0 x10E3/uL (ref 0.0–0.2)
Basos: 1 %
EOS (ABSOLUTE): 0 x10E3/uL (ref 0.0–0.4)
Eos: 1 %
Ferritin: 145 ng/mL (ref 30–400)
Folate: >20 ng/mL (ref >3.0)
Hematocrit: 39.8 % (ref 37.5–51.0)
Hemoglobin: 12.5 g/dL — ABNORMAL LOW (ref 13.0–17.7)
Immature Grans (Abs): 0.1 x10E3/uL (ref 0.0–0.1)
Immature Granulocytes: 2 %
Iron Saturation: 10 % — ABNORMAL LOW (ref 15–55)
Iron: 23 ug/dL — ABNORMAL LOW (ref 38–169)
Lymphocytes Absolute: 0.8 x10E3/uL (ref 0.7–3.1)
Lymphs: 22 %
MCH: 27.6 pg (ref 26.6–33.0)
MCHC: 31.4 g/dL — ABNORMAL LOW (ref 31.5–35.7)
MCV: 88 fL (ref 79–97)
Monocytes Absolute: 1.7 x10E3/uL — ABNORMAL HIGH (ref 0.1–0.9)
Monocytes: 46 %
Neutrophils Absolute: 1 x10E3/uL — ABNORMAL LOW (ref 1.4–7.0)
Neutrophils: 28 %
Platelets: 79 x10E3/uL — CL (ref 150–450)
RBC: 4.53 x10E6/uL (ref 4.14–5.80)
RDW: 15.9 % — ABNORMAL HIGH (ref 11.6–15.4)
Retic Ct Pct: 1.6 % (ref 0.6–2.6)
Total Iron Binding Capacity: 234 ug/dL — ABNORMAL LOW (ref 250–450)
UIBC: 211 ug/dL (ref 111–343)
Vitamin B-12: 776 pg/mL (ref 232–1245)
WBC: 3.7 x10E3/uL (ref 3.4–10.8)

## 2023-10-28 LAB — CMP14+EGFR
ALT: 32 IU/L (ref 0–44)
AST: 35 IU/L (ref 0–40)
Albumin: 3.8 g/dL (ref 3.8–4.8)
Alkaline Phosphatase: 71 IU/L (ref 44–121)
BUN/Creatinine Ratio: 9 — ABNORMAL LOW (ref 10–24)
BUN: 8 mg/dL (ref 8–27)
Bilirubin Total: 0.7 mg/dL (ref 0.0–1.2)
CO2: 23 mmol/L (ref 20–29)
Calcium: 9.3 mg/dL (ref 8.6–10.2)
Chloride: 102 mmol/L (ref 96–106)
Creatinine, Ser: 0.94 mg/dL (ref 0.76–1.27)
Globulin, Total: 2.2 g/dL (ref 1.5–4.5)
Glucose: 87 mg/dL (ref 70–99)
Potassium: 4.4 mmol/L (ref 3.5–5.2)
Sodium: 137 mmol/L (ref 134–144)
Total Protein: 6 g/dL (ref 6.0–8.5)
eGFR: 82 mL/min/1.73 (ref >59)

## 2023-10-28 LAB — LIPID PANEL
Chol/HDL Ratio: 2.9 ratio (ref 0.0–5.0)
Cholesterol, Total: 79 mg/dL — ABNORMAL LOW (ref 100–199)
HDL: 27 mg/dL — ABNORMAL LOW (ref >39)
LDL Chol Calc (NIH): 39 mg/dL (ref 0–99)
Triglycerides: 48 mg/dL (ref 0–149)
VLDL Cholesterol Cal: 13 mg/dL (ref 5–40)

## 2023-10-28 LAB — PSA, TOTAL AND FREE
PSA, Free Pct: 21.6 %
PSA, Free: 0.95 ng/mL
Prostate Specific Ag, Serum: 4.4 ng/mL — ABNORMAL HIGH (ref 0.0–4.0)

## 2023-10-28 LAB — VITAMIN D 25 HYDROXY (VIT D DEFICIENCY, FRACTURES): Vit D, 25-Hydroxy: 63.5 ng/mL (ref 30.0–100.0)

## 2023-10-28 LAB — THYROID PANEL WITH TSH
Free Thyroxine Index: 2.2 (ref 1.2–4.9)
T3 Uptake Ratio: 30 % (ref 24–39)
T4, Total: 7.4 ug/dL (ref 4.5–12.0)
TSH: 2.33 u[IU]/mL (ref 0.450–4.500)

## 2023-10-28 MED ORDER — PREDNISONE 20 MG PO TABS: 40.0000 mg | ORAL_TABLET | Freq: Every day | ORAL | 0 refills | Status: AC

## 2023-10-28 NOTE — Telephone Encounter (Signed)
 Copied from CRM [id number]. Topic: Clinical - Lab/Test Results >> Oct 28, 2023 11:54 AM Wyona SQUIBB wrote: Reason for CRM: Pt wife Clarita called in about missed call for labs.   Read as follows:  Severa Rock HERO, FNP to [facility]     10/28/23  9:36 AM Result Note Platelets remain low but have improved. Iron and Hgb are low. Follow up with hematology as scheduled. Will send in prednisone  for knee pain. All other labs look good.   Clarita understood. No further questions

## 2023-10-28 NOTE — Telephone Encounter (Signed)
 noted

## 2023-11-03 NOTE — Progress Notes (Deleted)
 [facility] [address], [place] [address]   CLINIC:  Medical Oncology/Hematology  PCP:  Severa Rock HERO, FNP [address] [place] [address] [phone]   REASON FOR VISIT:  Follow-up for pancytopenia  PRIOR THERAPY: None  CURRENT THERAPY: Surveillance  INTERVAL HISTORY:   Austin Cox 78 y.o. male returns for routine follow-up of thrombocytopenia.  He was last seen by Pleasant Barefoot PA-C on 05/04/2023.  At today's visit, he reports feeling fair.  He denies any hospitalizations, surgeries, new diagnoses, or changes in baseline health status since his last visit.     He does have occasional non-drenching night sweats.  No fevers, chills, or unintentional weight loss. Chronic fatigue is at baseline.  He has easy bruising, takes aspirin  81 mg daily.  He denies any major bleeding events, recurrent infections, masses, or lymphadenopathy.  He is currently on methotrexate  4 tablets weekly, as well as Enbrel.  He denies any changes in bowel habits.  He has been taking daily iron tablet (with stool softener) since February 2025.  He has 25% energy and 100% appetite. He endorses that he is maintaining a stable weight.  ASSESSMENT & PLAN:  1.  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  Limited data prior to 2016 with normal platelets noted in 2009. - For the most part, thrombocytopenia from 2016-2022 has been mild, with platelets 100-150. - Downtrending platelets since May 2022 with platelets ranging between 70-90 - Diagnosed with rheumatoid arthritis around 2018 - Has been on methotrexate  since about 2019, recently had methotrexate  dose decreased to 5 tablets weekly on 08/05/2021.  Reduced to 4 tablets weekly as of 10/03/2021. - He has been on Enbrel since about 2020 - Hematology work-up (09/20/2021): Flow cytometry: No monoclonal  B-cell population or significant T-cell abnormalities identified.  (Negative for LGL leukemia, which can be seen in rheumatoid arthritis) Negative hepatitis C and hepatitis B Normal copper , folate, B12, methylmalonic acid SPEP negative.  Mildly elevated kappa free light chain 32.7 with normal lambda free light chain and light chain ratio.  Normal LDH. Reticulocyte 0.9% - Abdominal ultrasound (10/23/2021): Splenomegaly measuring 14.9 cm diameter, normal-appearing liver. - Bone marrow biopsy (12/12/2021): Hypercellular bone marrow with trilineage hematopoiesis.  Adequate number of megakaryocytes.  Mild myeloid changes present with no increase in blastic cells.  Overall features nonspecific/nondiagnostic, likely secondary in nature due to patient's known history of rheumatoid arthritis and methotrexate  treatment, as well as possibly due to hypersplenism.  No evidence of lymphoproliferative process. - Cytogenetics (12/12/2021): 45,X,-Y[20].  Loss of Y chromosome noted, which is nonspecific; can happen in older men, but also can be seen in some hematologic malignancies and MDS in setting of clinical correlation. - PET scan (01/09/2022): Mild splenomegaly without any abnormal hypermetabolic activity.  Findings are NOT suggestive of lymphoma.  No hypermetabolic adenopathy on PET scan.  Additional findings discussed below. - CT abdomen/pelvis (06/04/2023): Splenomegaly measuring 16.4 cm - Patient had severe thrombocytopenia during COVID-19 infection in January 2024, with platelets as low as 18,000 on 03/29/2022, but recovered to 32,000 at time of discharge (03/31/2022). - Most recent labs (06/25/2023): Platelets 52, trending somewhat downward over the past 6 months.  Normal WBC 5.4, but monocytes elevated at 1.3.  Immature platelet fraction 6.3%. Labs from February 2025 showed normal LDH, normal B12/MMA, normal folate, normal copper  - Reports easy bruising.  Denies any bleeding, B symptoms,  recurrent infections, or  lymphadenopathy. - DIFFERENTIAL DIAGNOSIS: Suspect benign splenomegaly in the setting of rheumatoid arthritis, which is likely contributing to thrombocytopenia.  Patient might also have some underlying immune mediated thrombocytopenia in the setting of his RA.  He may also have medication-induced thrombocytopenia related to methotrexate  (occurs in 3% to 10% of patients with RA receiving MTX)  - PLAN: Continue surveillance with repeat labs and office visit in 4 months. - If platelets drop to <50, we will pursue trial of oral steroids.  Would also consider reaching out to rheumatology for adjustment of methotrexate  dose. - No indication for repeat BMBx (nonspecific findings on 12/12/2021) at this time, per discussion with Dr. Rogers on 05/04/2023.  2.  Anemia, mild and chronic - Baseline hemoglobin around 12 since at least January 2023 - Denies any major bleeding events.   - SPEP negative, as above. - He has been taking daily iron tablet (with stool softener) since February 2025 - Most recent labs (06/25/2023): Hgb 13.4/MCV 83.9 Ferritin 59, iron saturation 11% Labs from February 2025 showed normal LDH, normal GFR, normal B12/MMA, normal folate, normal copper  - PLAN: Hemoglobin normalized with iron levels improved after initiation of iron therapy. - Continue taking iron tablet daily w/ stool softener - Continue GI follow-up (see below)  3.  Monocytosis - Persistent monocytosis since July 2023 with monocytes 1.1 (09/20/2021), 2.2 (12/12/2021), and 2.2 (06/18/2022). - Most recent CBC/D (05/01/2023) with elevated monocytes 2.5. - Bone marrow biopsy (12/12/2021): Hypercellular bone marrow with trilineage hematopoiesis.  Adequate number of megakaryocytes.  Mild myeloid changes present with no increase in blastic cells.  Overall features nonspecific/nondiagnostic, likely secondary in nature due to patient's known history of rheumatoid arthritis and methotrexate  treatment, as well as possibly due to  hypersplenism.  No evidence of lymphoproliferative process. - No B symptoms - No palpable lymphadenopathy on exam  - PLAN: Persistent monocytosis may be reactive from RA.  Would consider repeat bone marrow biopsy if any major changes from baseline (nonspecific changes on BMBx from 12/12/2021).  No indication for repeat BMBx at this time, per discussion with Dr. Rogers on 05/04/2023.     4.   INCIDENTAL PET SCAN FINDING: Abnormal appearance of colon on PET scan - PET scan (obtained 01/09/2022 due to unexplained splenomegaly/thrombocytopenia) showed focus intense focal metabolic activity in the cecum at the level of the orifice of the appendix - Patient denies any bright blood per rectum, melena, or changes in bowel habits.  No unintentional weight loss. - Last colonoscopy was about 10 years ago, per patient - Patient seen by GI Kathye Kerns, PA-C) on 05/19/2023, with plans for possible repeat colonoscopy, depending on results of CT scan - CT abdomen/pelvis (06/04/2023): Possible soft tissue thickening at the base of the cecum, near origin of the appendix measuring 16 mm; appears to be in the general location of previously noted PET/CT activity.  Correlation with colonoscopy suggested.   - PLAN: Continue GI follow-up for upcoming colonoscopy.  Have notified GI provider of the above CT findings. ***Spoke to Mrs.[place] who says patient would like to cancel 09/23/23 procedure. Offered to move patient to 11/25/23 procedure at [facility] (8 weeks post URI). Mrs.Hartig states that patient has indicated he no longer wants to have the procedures done. He is tired of having stuff done on him and they can't do anything about what they find. Mrs.Ghan is advised to call back should they change their mind regarding rescheduling.  09/23/23 procedure cancelled for [facility].   5.  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 - Abdominal ultrasound (10/23/2021) with incidental finding of aneurysmal dilation of abdominal aorta with proximal abdominal aorta measuring 5.1 cm in diameter - CT abdomen/pelvis with contrast (06/04/2023): Distal descending thoracic aortic aneurysm measures up to 7.5 cm Diffuse aneurysmal dilatation of abdominal aorta - PLAN: Continue follow-up with Cardiothoracic Surgery (Dr. Kerrin) and Vascular Surgery (Dr. Gretta)   6.  Rheumatoid arthritis - Predominantly involves knees and ankles for the past 4 to 5 years.  Seen by Dr. Ingrid at [facility]. - He has been on Enbrel and methotrexate  for 2 to 3 years - Methotrexate  dose decreased to 4 tablets weekly on 10/03/2021   7.  Other history - PMH: Rheumatoid arthritis, hypertension, hyperlipidemia, carotid artery stenosis, small vessel cerebrovascular disease, coronary artery disease - Walks with the help of cane/walker secondary to rheumatoid arthritis. 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  Exposure to gasoline and diesel present.  Quit smoking 45 years ago. - No family history of low platelets.  Sister died of metastatic breast cancer.  Daughter died of non-Hodgkin's lymphoma.  Mother had head and neck cancer.   PLAN SUMMARY: >> Same-day labs (CBC/D, ferritin, iron/TIBC, immature platelet fraction) + OFFICE visit in 4 months  ** CC'd chart to gastroenterology providers regarding abnormal CT findings     REVIEW OF SYSTEMS:   Review of Systems  Constitutional:  Negative for appetite change, chills, diaphoresis, fatigue, fever and unexpected weight change.  HENT:   Negative for lump/mass and nosebleeds.   Eyes:  Negative for eye problems.  Respiratory:  Positive for cough (seasonal allergies). Negative for hemoptysis and shortness of breath.   Cardiovascular:  Negative for chest pain, leg swelling and  palpitations.  Gastrointestinal:  Negative for abdominal pain, blood in stool, constipation, diarrhea, nausea and vomiting.  Genitourinary:  Negative for hematuria. Difficulty urinating: prostate issues.  Skin: Negative.   Neurological:  Positive for numbness. Negative for dizziness, headaches and light-headedness.  Hematological:  Does not bruise/bleed easily.    PHYSICAL EXAM:  ECOG PERFORMANCE STATUS: 2 - Symptomatic, <50% confined to bed  There were no vitals filed for this visit.   There were no vitals filed for this visit.   Physical Exam Constitutional:      Appearance: Normal appearance. He is obese.  Cardiovascular:     Heart sounds: Normal heart sounds.  Pulmonary:     Breath sounds: Normal breath sounds. Decreased air movement present.  Neurological:     General: No focal deficit present.     Mental Status: Mental status is at baseline.  Psychiatric:        Behavior: Behavior normal. Behavior is cooperative.     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   Past Surgical History:  Procedure Laterality Date   CATARACT EXTRACTION     ENDARTERECTOMY Left 04/15/2021   Procedure: LEFT CAROTID ENDARTERECTOMY WITH;  Surgeon: Gretta Lonni PARAS, MD;  Location: [facility] OR;  Service: Vascular;  Laterality: Left;  EYE SURGERY     detached retina right eye   FRACTURE SURGERY     right leg   Stent in heart      SOCIAL HISTORY:  Social History   Socioeconomic History   Marital status: Married    Spouse name: Not on file   Number of children: 3   Years of education: Not on file   Highest education level: Not on file  Occupational History   Occupation: retired  Tobacco Use   Smoking status:  Former    Current packs/day: 0.00    Average packs/day: 0.2 packs/day for 10.0 years (2.0 ttl pk-yrs)    Types: Cigarettes    Start date: 07/05/1964    Quit date: 07/06/1974    Years since quitting: 49.3   Smokeless tobacco: Never  Vaping Use   Vaping status: Never Used  Substance and Sexual Activity   Alcohol  use: Not Currently    Alcohol /week: 1.0 standard drink of alcohol     Types: 1 Cans of beer per week   Drug use: Never   Sexual activity: Not on file  Other Topics Concern   Not on file  Social History Narrative   3 children - one passed away   Son in [place]   They raised 2 teenage grandchildren after their daughter and son in law both passed away    Social Drivers of Health   Financial Resource Strain: Low Risk  (07/01/2023)   Overall Financial Resource Strain (CARDIA)    Difficulty of Paying Living Expenses: Not hard at all  Food Insecurity: No Food Insecurity (07/01/2023)   Hunger Vital Sign    Worried About Running Out of Food in the Last Year: Never true    Ran Out of Food in the Last Year: Never true  Transportation Needs: No Transportation Needs (07/01/2023)   PRAPARE - Administrator, Civil Service (Medical): No    Lack of Transportation (Non-Medical): No  Physical Activity: Insufficiently Active (07/01/2023)   Exercise Vital Sign    Days of Exercise per Week: 3 days    Minutes of Exercise per Session: 30 min  Stress: No Stress Concern Present (07/01/2023)   [employer] of Occupational Health - Occupational Stress Questionnaire    Feeling of Stress : Not at all  Social Connections: Moderately Isolated (07/01/2023)   Social Connection and Isolation Panel    Frequency of Communication with Friends and Family: More than three times a week    Frequency of Social Gatherings with Friends and Family: Three times a week    Attends Religious Services: Never    Active Member of Clubs or Organizations: No    Attends Banker Meetings: Never     Marital Status: Married  Catering manager Violence: Not At Risk (07/01/2023)   Humiliation, Afraid, Rape, and Kick questionnaire    Fear of Current or Ex-Partner: No    Emotionally Abused: No    Physically Abused: No    Sexually Abused: No    FAMILY HISTORY:  Family History  Problem Relation Age of Onset   Cancer Mother        head neck cancer   Heart attack Father    Breast cancer Sister    Parkinson's disease Sister    Breast cancer Sister    Colon cancer Sister    Non-Hodgkin's lymphoma Daughter    Arthritis Son    Liver disease Neg Hx     CURRENT MEDICATIONS:  Outpatient Encounter Medications as  of 11/04/2023  Medication Sig   acetaminophen  (TYLENOL ) 650 MG CR tablet Take 650 mg by mouth every 8 (eight) hours as needed for pain.   albuterol  (VENTOLIN  HFA) 108 (90 Base) MCG/ACT inhaler Inhale 2 puffs into the lungs every 6 (six) hours as needed for wheezing or shortness of breath.   aspirin  81 MG EC tablet Take 81 mg by mouth daily.   baclofen  (LIORESAL ) 10 MG tablet Take 10 mg by mouth 2 (two) times daily.   Cholecalciferol  25 MCG (1000 UT) capsule Take 1 capsule (1,000 Units total) by mouth daily.   clotrimazole  (LOTRIMIN ) 1 % cream APPLY TO AFFECTED AREA TWICE A DAY   diclofenac Sodium (VOLTAREN) 1 % GEL PLACE ONTO THE SKIN 4 (FOUR) TIMES A DAY AS NEEDED FOR UP TO 30 DAYS.   etanercept (ENBREL SURECLICK) 50 MG/ML injection Inject 50 mg into the skin every Friday.   ferrous sulfate  325 (65 FE) MG EC tablet Take 1 tablet (325 mg total) by mouth daily with breakfast.   folic acid  (FOLVITE ) 1 MG tablet Take 1 mg by mouth daily.   gabapentin  (NEURONTIN ) 300 MG capsule Take 300 mg by mouth 2 (two) times daily.   guaifenesin  (HUMIBID E) 400 MG TABS tablet Take 1 tablet (400 mg total) by mouth every 6 (six) hours as needed.   methotrexate  (RHEUMATREX) 2.5 MG tablet Take 4 mg by mouth every Tuesday.   metoprolol  succinate (TOPROL -XL) 25 MG 24 hr tablet Take 1 tablet (25 mg  total) by mouth daily.   nitroGLYCERIN  (NITROSTAT ) 0.3 MG SL tablet Place 1 tablet (0.3 mg total) under the tongue every 5 (five) minutes as needed.   nystatin  (MYCOSTATIN /NYSTOP ) powder Apply 1 Application topically 3 (three) times daily.   rosuvastatin  (CRESTOR ) 40 MG tablet Take 1 tablet (40 mg total) by mouth daily.   Saw Palmetto 450-15 MG CAPS Take 2 tablets by mouth daily.   tamsulosin  (FLOMAX ) 0.4 MG CAPS capsule Take 2 capsules (0.8 mg total) by mouth at bedtime.   Tetrahydrozoline HCl (VISINE OP) Place 1 drop into both eyes daily as needed (dry eyes).   No facility-administered encounter medications on file as of 11/04/2023.    ALLERGIES:  No Known Allergies  LABORATORY DATA:  I have reviewed the labs as listed.  CBC    Component Value Date/Time   WBC 3.7 10/27/2023 0911   WBC 4.6 06/25/2023 1429   RBC 4.53 10/27/2023 0911   RBC 5.08 06/25/2023 1429   HGB 12.5 (L) 10/27/2023 0911   HCT 39.8 10/27/2023 0911   PLT 79 (LL) 10/27/2023 0911   MCV 88 10/27/2023 0911   MCH 27.6 10/27/2023 0911   MCH 26.4 06/25/2023 1429   MCHC 31.4 (L) 10/27/2023 0911   MCHC 31.5 06/25/2023 1429   RDW 15.9 (H) 10/27/2023 0911   LYMPHSABS 0.8 10/27/2023 0911   MONOABS 1.3 (H) 06/25/2023 1429   EOSABS 0.0 10/27/2023 0911   BASOSABS 0.0 10/27/2023 0911      Latest Ref Rng & Units 10/27/2023    9:11 AM 05/01/2023   11:41 AM 04/24/2023    9:24 AM  CMP  Glucose 70 - 99 mg/dL 87  81  90   BUN 8 - 27 mg/dL 8  19  11    Creatinine 0.76 - 1.27 mg/dL 9.05  8.78  8.90   Sodium 134 - 144 mmol/L 137  134  138   Potassium 3.5 - 5.2 mmol/L 4.4  4.0  3.9   Chloride 96 -  106 mmol/L 102  102  102   CO2 20 - 29 mmol/L 23  26  23    Calcium  8.6 - 10.2 mg/dL 9.3  9.4  9.2   Total Protein 6.0 - 8.5 g/dL 6.0  7.2  6.9   Total Bilirubin 0.0 - 1.2 mg/dL 0.7  1.0  1.0   Alkaline Phos 44 - 121 IU/L 71  53  71   AST 0 - 40 IU/L 35  19  16   ALT 0 - 44 IU/L 32  17  14     DIAGNOSTIC IMAGING:  I have  independently reviewed the relevant imaging and discussed with the patient.   WRAP UP:  All questions were answered. The patient knows to call the clinic with any problems, questions or concerns.  Medical decision making: Moderate  Time spent on visit: I spent 20 minutes counseling the patient face to face. The total time spent in the appointment was 30 minutes and more than 50% was on counseling.  Pleasant CHRISTELLA Barefoot, PA-C  11/03/23 8:08 PM

## 2023-11-04 ENCOUNTER — Inpatient Hospital Stay: Attending: Physician Assistant | Admitting: Physician Assistant

## 2023-11-04 ENCOUNTER — Inpatient Hospital Stay

## 2023-11-04 DIAGNOSIS — M25462 Effusion, left knee: Secondary | ICD-10-CM | POA: Diagnosis not present

## 2023-11-04 DIAGNOSIS — M0579 Rheumatoid arthritis with rheumatoid factor of multiple sites without organ or systems involvement: Secondary | ICD-10-CM | POA: Diagnosis not present

## 2023-11-04 DIAGNOSIS — M25562 Pain in left knee: Secondary | ICD-10-CM | POA: Diagnosis not present

## 2023-11-04 DIAGNOSIS — Z79899 Other long term (current) drug therapy: Secondary | ICD-10-CM | POA: Diagnosis not present

## 2023-11-15 DIAGNOSIS — M47816 Spondylosis without myelopathy or radiculopathy, lumbar region: Secondary | ICD-10-CM | POA: Diagnosis not present

## 2023-11-15 DIAGNOSIS — R2681 Unsteadiness on feet: Secondary | ICD-10-CM | POA: Diagnosis not present

## 2023-11-15 DIAGNOSIS — M0579 Rheumatoid arthritis with rheumatoid factor of multiple sites without organ or systems involvement: Secondary | ICD-10-CM | POA: Diagnosis not present

## 2023-11-25 DIAGNOSIS — M11262 Other chondrocalcinosis, left knee: Secondary | ICD-10-CM | POA: Diagnosis not present

## 2024-02-26 ENCOUNTER — Telehealth: Payer: Self-pay | Admitting: Family Medicine

## 2024-02-26 NOTE — Telephone Encounter (Unsigned)
 Copied from CRM [id number]. Topic: Clinical - Request for Lab/Test Order >> Feb 26, 2024  1:57 PM Wess RAMAN wrote: Reason for CRM: Patient's wife, Clarita, would like to know if the Rheumatologist, Dr. Ingrid, sent over future lab orders for patient  Callback #: [phone]

## 2024-02-26 NOTE — Telephone Encounter (Signed)
 Left message for Austin Cox, making her aware that the lab does have lab orders from Dr Ingrid. Patient needs to be scheduled for a lab appt.

## 2024-03-02 ENCOUNTER — Other Ambulatory Visit

## 2024-04-06 ENCOUNTER — Encounter: Payer: Self-pay | Admitting: Physical Therapy

## 2024-04-06 ENCOUNTER — Ambulatory Visit: Attending: Internal Medicine | Admitting: Physical Therapy

## 2024-04-06 DIAGNOSIS — G8929 Other chronic pain: Secondary | ICD-10-CM | POA: Diagnosis present

## 2024-04-06 DIAGNOSIS — R2681 Unsteadiness on feet: Secondary | ICD-10-CM | POA: Insufficient documentation

## 2024-04-06 DIAGNOSIS — M25562 Pain in left knee: Secondary | ICD-10-CM | POA: Diagnosis present

## 2024-04-06 DIAGNOSIS — M6281 Muscle weakness (generalized): Secondary | ICD-10-CM | POA: Insufficient documentation

## 2024-04-06 NOTE — Therapy (Addendum)
 " OUTPATIENT PHYSICAL THERAPY LOWER EXTREMITY EVALUATION   Patient Name: Austin Cox MRN: [id number] DOB:[birth year], 79 y.o., male Today's Date: 04/06/2024  END OF SESSION:  PT End of Session - 04/06/24 1149     Visit Number 1    Number of Visits 12    Date for Recertification  05/18/24    PT Start Time 1016    PT Stop Time 1103    PT Time Calculation (min) 47 min    Activity Tolerance Patient tolerated treatment well    Behavior During Therapy [facility] for tasks assessed/performed          Past Medical History:  Diagnosis Date   AAA (abdominal aortic aneurysm)    CAD    Carotid stenosis    Coronary artery disease    2 stents   Hypertension    RA (rheumatoid arthritis) (HCC)    Thoracic aortic aneurysm    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   Past Surgical History:  Procedure Laterality Date   CATARACT EXTRACTION     ENDARTERECTOMY Left 04/15/2021   Procedure: LEFT CAROTID ENDARTERECTOMY WITH;  Surgeon: Gretta Lonni PARAS, MD;  Location: [facility] OR;  Service: Vascular;  Laterality: Left;   EYE SURGERY     detached retina right eye   FRACTURE SURGERY     right leg   Stent in heart     Patient Active Problem List   Diagnosis Date Noted   Chronic pain of left knee 10/27/2023   Wheezing 09/22/2023   Descending thoracic aortic aneurysm 08/25/2023   Constipation 05/19/2023   Abnormal PET scan of colon 05/19/2023   IDA (iron deficiency anemia) 05/19/2023   Nocturia 04/24/2023   Immunodeficiency due to drugs 05/22/2022   Generalized weakness 03/30/2022   Thoracic aortic aneurysm without rupture 02/11/2022   AAA (abdominal aortic aneurysm) 11/14/2021   Cerebrovascular small vessel disease 09/18/2021   Gait instability 09/18/2021   Asymptomatic carotid artery stenosis without infarction, left 04/15/2021   Carotid artery stenosis 04/09/2021   Chronic pain syndrome 03/19/2021   Hard of hearing 03/19/2021   Hypertensive retinopathy of both eyes 11/15/2019    Pseudophakia of both eyes 11/15/2019   Spondylosis without myelopathy or radiculopathy, lumbar region 10/24/2018   Rheumatoid arthritis involving multiple sites with positive rheumatoid factor (HCC) 10/24/2018   Chronic myofascial pain 10/24/2018   DDD (degenerative disc disease), cervical 10/24/2018   Cervical spondylosis without myelopathy 10/24/2018   Thrombocytopenia 06/02/2017   Vitamin D  deficiency 02/17/2017   Combined forms of age-related cataract of right eye 08/28/2016   Gastroesophageal reflux disease without esophagitis 12/18/2015   Chronic bilateral low back pain without sciatica 12/18/2015   Benign prostatic hyperplasia 12/18/2015   Pure hypercholesterolemia 10/17/2015   S/P coronary artery stent placement 12/21/2014   Coronary artery disease involving native coronary artery of native heart without angina pectoris 12/21/2014   Essential (primary) hypertension 10/11/2013   Nerve root pain 08/18/2013    REFERRING PROVIDER: Wallie Ego MD  REFERRING DIAG: primary OA of left knee.    THERAPY DIAG:  Chronic pain of left knee  Muscle weakness (generalized)  Unsteadiness on feet  Rationale for Evaluation and Treatment: Rehabilitation  ONSET DATE: 2022.  SUBJECTIVE:   SUBJECTIVE STATEMENT: The patient presents to the clinic with c/o chronic left knee pain and unsteadiness.  He states sometimes it feel like he doesn't have a knee and his left knee gives way. 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  He states recent  knee injections have helped considerably.    PERTINENT HISTORY: Per patient:  Gout. PAIN:  Are you having pain? Yes: NPRS scale: 7/10.   Pain location: Left knee.   Pain description: Throbbing.   Aggravating factors: Walking.   Relieving factors: Rest.    PRECAUTIONS: Fall.  Needs to use Rolator at all times.  No pain increase left knee therex.  RED FLAGS: None   WEIGHT BEARING RESTRICTIONS: No  FALLS:  Has patient fallen in last 6  months? No  LIVING ENVIRONMENT: Lives with: lives with their spouse Lives in: House/apartment Stairs: One external and internal step with railing.   Has following equipment at home: Vannie - 4 wheeled  OCCUPATION: Retired.   PLOF: Independent with basic ADLs and Independent with household mobility with device  PATIENT GOALS: Get stronger, walk better, improve balance and decrease knee pain.     OBJECTIVE:   PATIENT SURVEYS:  LEFS:  12/80.    EDEMA:  Circumferential: apex of patella 1 cm greater on left than right.     POSTURE: No Significant postural limitations and rounded shoulders  PALPATION: C/o diffuse left knee pain.    LOWER EXTREMITY ROM:  Full active left knee range of motion with significant crepitus.    LOWER EXTREMITY MMT: Left hip flexion and abduction is 2+ to 3-/5.  Left quad strength is 3-/5    LOWER EXTREMITY SPECIAL TESTS:  Stable left knee and normal patellar mobility.    FUNCTIONAL TESTS:  5 times sit to stand: 25 seconds.  (+) Romberg test.  GAIT: He gait safely with his Rolator with increased left tow out and a decrease in step and stride length.                                                                                                                                  TREATMENT DATE: 04/06/24:  Nustep level x 8 minutes for LE strengthning f/b LE elevation and vasopneumatic on low to patient's left knee x 10 minutes.      PATIENT EDUCATION:  Education details:  Person educated:  International aid/development worker:  Education comprehension:   HOME EXERCISE PROGRAM:   ASSESSMENT:  CLINICAL IMPRESSION: The patient presents to OPPT with c/o left knee pain.  He also reports problems with his balance.  He demonstrates a positive Romberg and the 5 time sit to stand test was performed in 25 seconds.  His LEFS score is 12/80.  He is weak over his left LE.  He does walk safely with a Rolator with increased left toe out and decreased step and stride length.   Patient will benefit from skilled physical therapy intervention to address pain and deficits.    OBJECTIVE IMPAIRMENTS: Abnormal gait, decreased balance, decreased strength, increased edema, and pain.   ACTIVITY LIMITATIONS: carrying, lifting, bending, standing, transfers, and locomotion level  PARTICIPATION LIMITATIONS: meal prep, cleaning, laundry, shopping, community activity, and yard work  PERSONAL FACTORS:  Time since onset of injury/illness/exacerbation are also affecting patient's functional outcome.   REHAB POTENTIAL: Good  CLINICAL DECISION MAKING: Evolving/moderate complexity  EVALUATION COMPLEXITY: Low   GOALS:  LONG TERM GOALS: Target date: 05/18/24  Ind with a HEP.  Goal status: INITIAL  2.  Improve LEFS score by at leat 15 points.  Goal status: INITIAL  3.  Improve 5 time sit to stand test b 5 seconds.    Goal status: INITIAL  4.  Improve left LE strength by at least one muscle strength grade to improve stability for functional tasks.  Goal status: INITIAL  5.  Perform ADL's with left knee pain not exceeding 3-4/10.  Goal status: INITIAL  PLAN:  PT FREQUENCY: 2x/week  PT DURATION: 6 weeks  PLANNED INTERVENTIONS: 97110-Therapeutic exercises, 97530- Therapeutic activity, [id number]- Neuromuscular re-education, 97535- Self Care, [id number]- Manual therapy, G0283- Electrical stimulation (unattended), 97035- Ultrasound, Patient/Family education, Cryotherapy, and Moist heat  PLAN FOR NEXT SESSION: LE strengthening, gait and balance activities.  Modalities as needed.     Lyndzee Kliebert, PT 04/06/2024, 12:09 PM  "

## 2024-04-15 ENCOUNTER — Ambulatory Visit: Admitting: Physical Therapy

## 2024-04-18 ENCOUNTER — Ambulatory Visit: Payer: Self-pay

## 2024-04-18 ENCOUNTER — Telehealth: Admitting: Nurse Practitioner

## 2024-04-18 DIAGNOSIS — L03011 Cellulitis of right finger: Secondary | ICD-10-CM

## 2024-04-18 MED ORDER — CEPHALEXIN 500 MG PO CAPS: 500.0000 mg | ORAL_CAPSULE | Freq: Two times a day (BID) | ORAL | 0 refills | Status: AC

## 2024-04-18 NOTE — Telephone Encounter (Signed)
 FYI Only or Action Required?: FYI only for provider: Pt scheduled for an UC virtual visit today 04/18/24.  Patient was last seen in primary care on 10/27/2023 by Severa Rock HERO, FNP.  Called Nurse Triage reporting Wound Infection.  Symptoms began a week ago.  Interventions attempted: Nothing.  Symptoms are: unchanged.  Triage Disposition: See Physician Within 24 Hours  Patient/caregiver understands and will follow disposition?:   Reason for Disposition  Pus under skinfold of fingernail (cuticle - nailfold area) or pus under fingernail  Answer Assessment - Initial Assessment Questions Pt's wife calling to report the pt's right thumb is swollen, puss came out of cuticle when pt pushed on it, looks worse today - throbbing feeling - onset early last week, pushed cuticle back with toothpick and puss came out, pain 5/10. Denies injury to the thumb.  1. SYMPTOM: What's the main symptom you're concerned about?  (e.g., pain or redness of skin fold around nail)     Pain  2. LOCATION: Which finger?      Right thumb 3. ONSET: When did it start? (e.g., hours, days, weeks ago)     Early last week 4. PAIN: Is there any pain? If Yes, ask: How bad is the pain?   (Scale 0-10; or none, mild, moderate, severe)     5/10 5. PUS: Is there a small pocket of pus in the skin fold near the nail? If so, ask: How big is it?     No - puss already came out of cuticle 6. FREQUENCY: Have you ever had this problem before?     N/a  7. OTHER SYMPTOMS: Do you have any other symptoms? (e.g., chills, fever, red streak up the hand, redness of entire finger)     N/a  Protocols used: Fingernail Infection-A-AH  Reason for Triage: patient has severe pain and swelling in right thumb

## 2024-04-18 NOTE — Patient Instructions (Signed)
 " Austin Cox, thank you for joining Hadassah Fireman, NP for today's virtual visit.  While this provider is not your primary care provider (PCP), if your PCP is located in our provider database this encounter information will be shared with them immediately following your visit.   A [facility] MyChart account gives you access to today's visit and all your visits, tests, and labs performed at [facility]  click here if you don't have a  MyChart account or go to [email or web address]  Consent: (Patient) Austin Cox provided verbal consent for this virtual visit at the beginning of the encounter.  Current Medications:  Current Outpatient Medications:    cephALEXin  (KEFLEX ) 500 MG capsule, Take 1 capsule (500 mg total) by mouth 2 (two) times daily for 10 days., Disp: 20 capsule, Rfl: 0   acetaminophen  (TYLENOL ) 650 MG CR tablet, Take 650 mg by mouth every 8 (eight) hours as needed for pain., Disp: , Rfl:    albuterol  (VENTOLIN  HFA) 108 (90 Base) MCG/ACT inhaler, Inhale 2 puffs into the lungs every 6 (six) hours as needed for wheezing or shortness of breath., Disp: 8 g, Rfl: 2   aspirin  81 MG EC tablet, Take 81 mg by mouth daily., Disp: , Rfl:    baclofen  (LIORESAL ) 10 MG tablet, Take 10 mg by mouth 2 (two) times daily., Disp: , Rfl:    Cholecalciferol  25 MCG (1000 UT) capsule, Take 1 capsule (1,000 Units total) by mouth daily., Disp: 90 capsule, Rfl: 2   clotrimazole  (LOTRIMIN ) 1 % cream, APPLY TO AFFECTED AREA TWICE A DAY, Disp: 60 g, Rfl: 1   diclofenac Sodium (VOLTAREN) 1 % GEL, PLACE ONTO THE SKIN 4 (FOUR) TIMES A DAY AS NEEDED FOR UP TO 30 DAYS., Disp: 100 g, Rfl: 1   etanercept (ENBREL SURECLICK) 50 MG/ML injection, Inject 50 mg into the skin every Friday., Disp: , Rfl:    ferrous sulfate  325 (65 FE) MG EC tablet, Take 1 tablet (325 mg total) by mouth daily with breakfast., Disp: 90 tablet, Rfl: 3   folic acid  (FOLVITE ) 1 MG tablet, Take 1 mg by mouth daily.,  Disp: , Rfl:    gabapentin  (NEURONTIN ) 300 MG capsule, Take 300 mg by mouth 2 (two) times daily., Disp: , Rfl:    guaifenesin  (HUMIBID E) 400 MG TABS tablet, Take 1 tablet (400 mg total) by mouth every 6 (six) hours as needed., Disp: 30 tablet, Rfl: 0   methotrexate  (RHEUMATREX) 2.5 MG tablet, Take 4 mg by mouth every Tuesday., Disp: , Rfl:    metoprolol  succinate (TOPROL -XL) 25 MG 24 hr tablet, Take 1 tablet (25 mg total) by mouth daily., Disp: 100 tablet, Rfl: 3   nitroGLYCERIN  (NITROSTAT ) 0.3 MG SL tablet, Place 1 tablet (0.3 mg total) under the tongue every 5 (five) minutes as needed., Disp: 90 tablet, Rfl: 0   nystatin  (MYCOSTATIN /NYSTOP ) powder, Apply 1 Application topically 3 (three) times daily., Disp: 15 g, Rfl: 0   rosuvastatin  (CRESTOR ) 40 MG tablet, Take 1 tablet (40 mg total) by mouth daily., Disp: 90 tablet, Rfl: 3   Saw Palmetto 450-15 MG CAPS, Take 2 tablets by mouth daily., Disp: , Rfl:    tamsulosin  (FLOMAX ) 0.4 MG CAPS capsule, Take 2 capsules (0.8 mg total) by mouth at bedtime., Disp: 90 capsule, Rfl: 3   Tetrahydrozoline HCl (VISINE OP), Place 1 drop into both eyes daily as needed (dry eyes)., Disp: , Rfl:    Medications ordered in this encounter:  Meds ordered this  encounter  Medications   cephALEXin  (KEFLEX ) 500 MG capsule    Sig: Take 1 capsule (500 mg total) by mouth 2 (two) times daily for 10 days.    Dispense:  20 capsule    Refill:  0    Supervising Provider:   BLAISE ALEENE KIDD [[id number]]     *If you need refills on other medications prior to your next appointment, please contact your pharmacy*  Follow-Up: Call back or seek an in-person evaluation if the symptoms worsen or if the condition fails to improve as anticipated.   Other Instructions Please pick up antibiotic prescription at your pharmacy. Take as prescribed until course of treatment is finished.  Continue wound care as we discussed.  Please seek in person care if your symptoms persist or worsen.     If you have been instructed to have an in-person evaluation today at a local Urgent Care facility, please use the link below. It will take you to a list of all of our available [facility], including address, phone number and hours of operation. Please do not delay care.  [facility]  If you or a family member do not have a primary care provider, use the link below to schedule a visit and establish care. When you choose a [facility] primary care physician or advanced practice provider, you gain a long-term partner in health. Find a Primary Care Provider  Learn more about [facility] in-office and virtual care options: [facility] Get Care Now  "

## 2024-04-28 ENCOUNTER — Ambulatory Visit: Payer: Self-pay | Admitting: Family Medicine

## 2024-07-01 ENCOUNTER — Ambulatory Visit

## 2024-07-04 ENCOUNTER — Ambulatory Visit: Payer: Self-pay
